# Patient Record
Sex: Male | Born: 1935
Health system: Southern US, Community
[De-identification: ages and names within clinical notes are randomized; demographics above are authoritative.]

## PROBLEM LIST (undated history)

## (undated) DIAGNOSIS — I639 Cerebral infarction, unspecified: Secondary | ICD-10-CM

## (undated) DIAGNOSIS — M1990 Unspecified osteoarthritis, unspecified site: Secondary | ICD-10-CM

## (undated) DIAGNOSIS — I509 Heart failure, unspecified: Secondary | ICD-10-CM

## (undated) DIAGNOSIS — I10 Essential (primary) hypertension: Secondary | ICD-10-CM

## (undated) DIAGNOSIS — R011 Cardiac murmur, unspecified: Secondary | ICD-10-CM

## (undated) DIAGNOSIS — N289 Disorder of kidney and ureter, unspecified: Secondary | ICD-10-CM

## (undated) DIAGNOSIS — R55 Syncope and collapse: Secondary | ICD-10-CM

## (undated) DIAGNOSIS — N529 Male erectile dysfunction, unspecified: Secondary | ICD-10-CM

## (undated) DIAGNOSIS — M545 Low back pain: Secondary | ICD-10-CM

## (undated) DIAGNOSIS — E785 Hyperlipidemia, unspecified: Secondary | ICD-10-CM

## (undated) DIAGNOSIS — N189 Chronic kidney disease, unspecified: Secondary | ICD-10-CM

## (undated) DIAGNOSIS — I499 Cardiac arrhythmia, unspecified: Secondary | ICD-10-CM

## (undated) DIAGNOSIS — K635 Polyp of colon: Secondary | ICD-10-CM

## (undated) DIAGNOSIS — IMO0002 Reserved for concepts with insufficient information to code with codable children: Secondary | ICD-10-CM

## (undated) DIAGNOSIS — N400 Enlarged prostate without lower urinary tract symptoms: Secondary | ICD-10-CM

## (undated) DIAGNOSIS — Q600 Renal agenesis, unilateral: Secondary | ICD-10-CM

## (undated) HISTORY — DX: Reserved for concepts with insufficient information to code with codable children: IMO0002

## (undated) HISTORY — DX: Benign prostatic hyperplasia without lower urinary tract symptoms: N40.0

## (undated) HISTORY — DX: Renal agenesis, unilateral: Q60.0

## (undated) HISTORY — DX: Low back pain: M54.5

## (undated) HISTORY — DX: Unspecified osteoarthritis, unspecified site: M19.90

## (undated) HISTORY — DX: Disorder of kidney and ureter, unspecified: N28.9

## (undated) HISTORY — DX: Polyp of colon: K63.5

## (undated) HISTORY — DX: Essential (primary) hypertension: I10

## (undated) HISTORY — DX: Male erectile dysfunction, unspecified: N52.9

## (undated) HISTORY — DX: Chronic kidney disease, unspecified: N18.9

## (undated) HISTORY — DX: Heart failure, unspecified: I50.9

## (undated) HISTORY — DX: Hyperlipidemia, unspecified: E78.5

---

## 1994-09-23 HISTORY — PX: KNEE ARTHROSCOPY: SHX127

## 1998-10-02 ENCOUNTER — Emergency Department (HOSPITAL_COMMUNITY): Admission: EM | Admit: 1998-10-02 | Discharge: 1998-10-02 | Payer: Self-pay | Admitting: Emergency Medicine

## 1998-10-02 ENCOUNTER — Encounter: Payer: Self-pay | Admitting: Emergency Medicine

## 1998-10-31 ENCOUNTER — Encounter: Payer: Self-pay | Admitting: Urology

## 1998-10-31 ENCOUNTER — Ambulatory Visit (HOSPITAL_COMMUNITY): Admission: RE | Admit: 1998-10-31 | Discharge: 1998-10-31 | Payer: Self-pay | Admitting: Urology

## 1999-06-26 ENCOUNTER — Other Ambulatory Visit: Admission: RE | Admit: 1999-06-26 | Discharge: 1999-06-26 | Payer: Self-pay | Admitting: Urology

## 1999-08-21 ENCOUNTER — Other Ambulatory Visit: Admission: RE | Admit: 1999-08-21 | Discharge: 1999-08-21 | Payer: Self-pay | Admitting: Urology

## 2003-11-11 ENCOUNTER — Emergency Department (HOSPITAL_COMMUNITY): Admission: EM | Admit: 2003-11-11 | Discharge: 2003-11-11 | Payer: Self-pay | Admitting: Family Medicine

## 2005-08-07 ENCOUNTER — Emergency Department (HOSPITAL_COMMUNITY): Admission: EM | Admit: 2005-08-07 | Discharge: 2005-08-07 | Payer: Self-pay | Admitting: Emergency Medicine

## 2005-11-21 LAB — HM COLONOSCOPY

## 2007-08-20 ENCOUNTER — Emergency Department (HOSPITAL_COMMUNITY): Admission: EM | Admit: 2007-08-20 | Discharge: 2007-08-20 | Payer: Self-pay | Admitting: Emergency Medicine

## 2009-10-10 ENCOUNTER — Encounter (INDEPENDENT_AMBULATORY_CARE_PROVIDER_SITE_OTHER): Payer: Self-pay | Admitting: *Deleted

## 2009-10-30 ENCOUNTER — Encounter (INDEPENDENT_AMBULATORY_CARE_PROVIDER_SITE_OTHER): Payer: Self-pay

## 2009-10-31 ENCOUNTER — Ambulatory Visit: Payer: Self-pay | Admitting: Gastroenterology

## 2009-11-08 ENCOUNTER — Ambulatory Visit: Payer: Self-pay | Admitting: Oncology

## 2009-11-14 ENCOUNTER — Ambulatory Visit: Payer: Self-pay | Admitting: Gastroenterology

## 2009-11-15 ENCOUNTER — Encounter: Payer: Self-pay | Admitting: Gastroenterology

## 2009-11-17 LAB — CBC WITH DIFFERENTIAL/PLATELET
BASO%: 0.5 % (ref 0.0–2.0)
EOS%: 4 % (ref 0.0–7.0)
HCT: 39.2 % (ref 38.4–49.9)
HGB: 13.5 g/dL (ref 13.0–17.1)
LYMPH%: 38.9 % (ref 14.0–49.0)
Platelets: 223 10*3/uL (ref 140–400)
RBC: 4.73 10*6/uL (ref 4.20–5.82)
RDW: 13.3 % (ref 11.0–14.6)
WBC: 5.8 10*3/uL (ref 4.0–10.3)
lymph#: 2.2 10*3/uL (ref 0.9–3.3)

## 2009-11-17 LAB — CHCC SMEAR

## 2009-11-21 LAB — COMPREHENSIVE METABOLIC PANEL
ALT: 17 U/L (ref 0–53)
Albumin: 4.5 g/dL (ref 3.5–5.2)
Alkaline Phosphatase: 86 U/L (ref 39–117)
Sodium: 141 mEq/L (ref 135–145)

## 2009-11-21 LAB — IMMUNOFIXATION INTE

## 2009-11-21 LAB — KAPPA/LAMBDA LIGHT CHAINS
Kappa free light chain: 2.11 mg/dL — ABNORMAL HIGH (ref 0.33–1.94)
Lambda Free Lght Chn: 5.61 mg/dL — ABNORMAL HIGH (ref 0.57–2.63)

## 2009-11-21 LAB — IMMUNOFIXATION ELECTROPHORESIS
IgA: 304 mg/dL (ref 68–378)
IgG (Immunoglobin G), Serum: 1450 mg/dL (ref 694–1618)
Total Protein, Serum Electrophoresis: 7.7 g/dL (ref 6.0–8.3)

## 2009-11-21 LAB — LACTATE DEHYDROGENASE: LDH: 166 U/L (ref 94–250)

## 2009-11-21 LAB — SEDIMENTATION RATE: Sed Rate: 22 mm/hr — ABNORMAL HIGH (ref 0–16)

## 2009-11-21 LAB — PROTEIN / CREATININE RATIO, URINE
Creatinine, Urine: 155.3 mg/dL
Total Protein, Urine: 4 mg/dL

## 2010-01-23 ENCOUNTER — Encounter: Admission: RE | Admit: 2010-01-23 | Discharge: 2010-01-23 | Payer: Self-pay | Admitting: Family Medicine

## 2010-03-28 ENCOUNTER — Ambulatory Visit: Payer: Self-pay | Admitting: Oncology

## 2010-04-12 ENCOUNTER — Encounter: Admission: RE | Admit: 2010-04-12 | Discharge: 2010-04-12 | Payer: Self-pay | Admitting: Diagnostic Neuroimaging

## 2010-05-24 ENCOUNTER — Emergency Department (HOSPITAL_COMMUNITY): Admission: EM | Admit: 2010-05-24 | Discharge: 2010-05-24 | Payer: Self-pay | Admitting: Family Medicine

## 2010-08-30 HISTORY — PX: CARDIOVASCULAR STRESS TEST: SHX262

## 2010-10-25 NOTE — Letter (Signed)
Summary: Previsit letter  [facility]  [place], [place] [address]   Phone: [phone]  Fax: [phone]       10/10/2009 MRN: [id number]  John Hines [address] [place], [place]  [address]  Dear [name],  Welcome to the Gastroenterology Division at [facility].    You are scheduled to see a nurse for your pre-procedure visit on 10-31-09 at 1PM on the 3rd floor at [facility], [place] [employer].  We ask that you try to arrive at our office 15 minutes prior to your appointment time to allow for check-in.  Your nurse visit will consist of discussing your medical and surgical history, your immediate family medical history, and your medications.    Please bring a complete list of all your medications or, if you prefer, bring the medication bottles and we will list them.  We will need to be aware of both prescribed and over the counter drugs.  We will need to know exact dosage information as well.  If you are on blood thinners (Coumadin, Plavix, Aggrenox, Ticlid, etc.) please call our office today/prior to your appointment, as we need to consult with your physician about holding your medication.   Please be prepared to read and sign documents such as consent forms, a financial agreement, and acknowledgement forms.  If necessary, and with your consent, a friend or relative is welcome to sit-in on the nurse visit with you.  Please bring your insurance card so that we may make a copy of it.  If your insurance requires a referral to see a specialist, please bring your referral form from your primary care physician.  No co-pay is required for this nurse visit.     If you cannot keep your appointment, please call [phone] to cancel or reschedule prior to your appointment date.  This allows [place] the opportunity to schedule an appointment for another patient in need of care.    Thank you for choosing [facility] for your medical needs.  We  appreciate the opportunity to care for you.  Please visit [place] at our website  to learn more about our practice.                     Sincerely.                                                                                                                   The Gastroenterology Division

## 2010-10-25 NOTE — Letter (Signed)
Summary: Results Letter  [facility]  [place], [place] [address]   Phone: [phone]  Fax: [phone]        November 15, 2009 MRN: [id number]    John Hines [address] [place], [place]  [address]    Dear John Hines,   Good news.  The polyp(s) that were removed during your recent procedure were NOT pre-cancerous.   We normally recommend repeat colonscopy in 10 years however at that point you will be 87 and colon cancer screening is usually not important after the age of 17.   Please call if you have any questions or concerns.       Sincerely,  Milus Banister MD  This letter has been electronically signed by your physician.  Appended Document: Results Letter  Letter mailed 2.24.11

## 2010-10-25 NOTE — Procedures (Signed)
Summary: Colonoscopy  Patient: John Hines Note: All result statuses are Final unless otherwise noted.  Tests: (1) Colonoscopy (COL)   COL Colonoscopy           [facility]     [place], [place]  [address]           COLONOSCOPY PROCEDURE REPORT           PATIENT:  [name], [name]  MR#:  [id number]     BIRTHDATE:  Jun 20, 1932, 69 yrs. old  GENDER:  male     ENDOSCOPIST:  Milus Banister, MD     Referred by:  Jenna Luo, M.D.     Redge Gainer, M.D.     PROCEDURE DATE:  11/14/2009     PROCEDURE:  Colonoscopy with biopsy     ASA CLASS:  Class II     INDICATIONS:  Routine Risk Screening.had colonoscopy 15-20 years     ago, polyp removed, was told he needed another colonoscopy in     "10-15" years     MEDICATIONS:   Fentanyl 50 mcg IV, Versed 7 mg IV     DESCRIPTION OF PROCEDURE:   After the risks benefits and     alternatives of the procedure were thoroughly explained, informed     consent was obtained.  Digital rectal exam was performed and     revealed no rectal masses.   The LB CF-[id number] endoscope     was introduced through the anus and advanced to the cecum, which     was identified by both the appendix and ileocecal valve, without     limitations.  The quality of the prep was excellent, using     MoviPrep.  The instrument was then slowly withdrawn as the colon     was fully examined.     <<PROCEDUREIMAGES>>     FINDINGS:  A sessile polyp was found in the sigmoid colon. This     was 1-72mm across, removed with forceps and sent to pathology (see     image3 and image5).  This was otherwise a normal examination of     the colon (see image6, image1, and image2).   Retroflexed views in     the rectum revealed no abnormalities.    The scope was then     withdrawn from the patient and the procedure completed.     COMPLICATIONS:  None           ENDOSCOPIC IMPRESSION:     1) Small sessile polyp in the sigmoid colon; removed and sent  to     pathology     2) Otherwise normal examination           RECOMMENDATIONS:     1) If the polyp(s) removed today are proven to be adenomatous     (pre-cancerous) polyps, you will need a repeat colonoscopy in 5     years. Otherwise you should continue to follow colorectal cancer     screening guidelines for "routine risk" patients with colonoscopy     in 10 years.  You will be 87 at that point and colorectal cancer     screening/polyp surveillance may not be a clinically relevant     issue for you.     2) You will receive a letter within 1-2 weeks with the results     of your biopsy as well as final recommendations. Please call my  office if you have not received a letter after 3 weeks.           ______________________________     Milus Banister, MD           n.     eSIGNED:   Milus Banister at 11/14/2009 09:55 AM           Myrtie Hawk, [id number]  Note: An exclamation mark (!) indicates a result that was not dispersed into the flowsheet. Document Creation Date: 11/14/2009 9:55 AM _______________________________________________________________________  (1) Order result status: Final Collection or observation date-time: 11/14/2009 09:49 Requested date-time:  Receipt date-time:  Reported date-time:  Referring Physician:   Ordering Physician: Owens Loffler [phone]) Specimen Source:  Source: Tawanna Cooler Order Number: [id number] Lab site:

## 2010-10-25 NOTE — Letter (Signed)
Summary: [facility] Instructions  [facility]  [place], [place] [address]   Phone: [phone]  Fax: [phone]       ADRI LEGNER    75/05/1932    MRN: [id number]        Procedure Day /Date:  Tuesday 11/14/2009     Arrival Time: 9:00 am      Procedure Time: 10:00 am     Location of Procedure:                    _ x_  [facility] (4th Floor)                        [facility]   Starting 5 days prior to your procedure Thursday 2/17 do not eat nuts, seeds, popcorn, corn, beans, peas,  salads, or any raw vegetables.  Do not take any fiber supplements (e.g. Metamucil, Citrucel, and Benefiber).  THE DAY BEFORE YOUR PROCEDURE         DATE: Monday 2/21  1.  Drink clear liquids the entire day-NO SOLID FOOD  2.  Do not drink anything colored red or purple.  Avoid juices with pulp.  No orange juice.  3.  Drink at least 64 oz. (8 glasses) of fluid/clear liquids during the day to prevent dehydration and help the prep work efficiently.  CLEAR LIQUIDS INCLUDE: Water Jello Ice Popsicles Tea (sugar ok, no milk/cream) Powdered fruit flavored drinks Coffee (sugar ok, no milk/cream) Gatorade Juice: apple, white grape, white cranberry  Lemonade Clear bullion, consomm, broth Carbonated beverages (any kind) Strained chicken noodle soup Hard Candy                             4.  In the morning, mix first dose of MoviPrep solution:    Empty 1 Pouch A and 1 Pouch B into the disposable container    Add lukewarm drinking water to the top line of the container. Mix to dissolve    Refrigerate (mixed solution should be used within 24 hrs)  5.  Begin drinking the prep at 5:00 p.m. The MoviPrep container is divided by 4 marks.   Every 15 minutes drink the solution down to the next mark (approximately 8 oz) until the full liter is complete.   6.  Follow completed prep with 16 oz of clear liquid of your choice  (Nothing red or purple).  Continue to drink clear liquids until bedtime.  7.  Before going to bed, mix second dose of MoviPrep solution:    Empty 1 Pouch A and 1 Pouch B into the disposable container    Add lukewarm drinking water to the top line of the container. Mix to dissolve    Refrigerate  THE DAY OF YOUR PROCEDURE      DATE: Tuesday 2/22  Beginning at 5:00 a.m. (5 hours before procedure):         1. Every 15 minutes, drink the solution down to the next mark (approx 8 oz) until the full liter is complete.  2. Follow completed prep with 16 oz. of clear liquid of your choice.    3. You may drink clear liquids until 8:00  (2 HOURS BEFORE PROCEDURE).   MEDICATION INSTRUCTIONS  Unless otherwise instructed, you should take regular prescription medications with a small sip of water   as early as possible the morning of  your procedure..  Additional medication instructions: Do not take fluid pill am of procedure         OTHER INSTRUCTIONS  You will need a responsible adult at least 75 years of age to accompany you and drive you home.   This person must remain in the waiting room during your procedure.  Wear loose fitting clothing that is easily removed.  Leave jewelry and other valuables at home.  However, you may wish to bring a book to read or  an iPod/MP3 player to listen to music as you wait for your procedure to start.  Remove all body piercing jewelry and leave at home.  Total time from sign-in until discharge is approximately 2-3 hours.  You should go home directly after your procedure and rest.  You can resume normal activities the  day after your procedure.  The day of your procedure you should not:   Drive   Make legal decisions   Operate machinery   Drink alcohol   Return to work  You will receive specific instructions about eating, activities and medications before you leave.    The above instructions have been reviewed and explained to me by    Cornelia Copa RN  October 31, 2009 12:59 PM     I fully understand and can verbalize these instructions _____________________________ Date _________

## 2010-10-25 NOTE — Miscellaneous (Signed)
Summary: Lec previsit  Clinical Lists Changes  Medications: Added new medication of MOVIPREP 100 GM  SOLR (PEG-KCL-NACL-NASULF-NA ASC-C) As per prep instructions. - Signed Rx of MOVIPREP 100 GM  SOLR (PEG-KCL-NACL-NASULF-NA ASC-C) As per prep instructions.;  #1 x 0;  Signed;  Entered by: Cornelia Copa RN;  Authorized by: Milus Banister MD;  Method used: Electronically to [facility]  [address] [place], [place]  [address], Ph: [phone] or [phone], Fax: [phone] Allergies: Added new allergy or adverse reaction of PENICILLIN Observations: Added new observation of NKA: F (10/31/2009 12:38)    Prescriptions: MOVIPREP 100 GM  SOLR (PEG-KCL-NACL-NASULF-NA ASC-C) As per prep instructions.  #1 x 0   Entered by:   Cornelia Copa RN   Authorized by:   Milus Banister MD   Signed by:   Cornelia Copa RN on 10/31/2009   Method used:   [facility]        [facility]  [address] (retail)       [address]       [place], [place]  [address]       Ph: [phone] or [phone]       Fax: [phone]   RxID:   [id number]

## 2011-04-22 ENCOUNTER — Encounter: Payer: Self-pay | Admitting: Family Medicine

## 2011-04-22 DIAGNOSIS — I10 Essential (primary) hypertension: Secondary | ICD-10-CM | POA: Insufficient documentation

## 2011-04-22 DIAGNOSIS — E785 Hyperlipidemia, unspecified: Secondary | ICD-10-CM | POA: Insufficient documentation

## 2011-08-19 ENCOUNTER — Other Ambulatory Visit: Payer: Self-pay

## 2011-08-19 ENCOUNTER — Emergency Department (HOSPITAL_COMMUNITY): Payer: PRIVATE HEALTH INSURANCE

## 2011-08-19 ENCOUNTER — Observation Stay (HOSPITAL_COMMUNITY)
Admission: EM | Admit: 2011-08-19 | Discharge: 2011-08-21 | Disposition: A | Discharge status: Home / Self Care | Payer: PRIVATE HEALTH INSURANCE | Source: Ambulatory Visit | Attending: Family Medicine | Admitting: Family Medicine

## 2011-08-19 ENCOUNTER — Encounter (HOSPITAL_COMMUNITY): Payer: Self-pay | Admitting: General Practice

## 2011-08-19 DIAGNOSIS — G319 Degenerative disease of nervous system, unspecified: Secondary | ICD-10-CM | POA: Insufficient documentation

## 2011-08-19 DIAGNOSIS — E785 Hyperlipidemia, unspecified: Secondary | ICD-10-CM | POA: Insufficient documentation

## 2011-08-19 DIAGNOSIS — I517 Cardiomegaly: Secondary | ICD-10-CM | POA: Insufficient documentation

## 2011-08-19 DIAGNOSIS — R7309 Other abnormal glucose: Secondary | ICD-10-CM | POA: Insufficient documentation

## 2011-08-19 DIAGNOSIS — R0989 Other specified symptoms and signs involving the circulatory and respiratory systems: Secondary | ICD-10-CM | POA: Insufficient documentation

## 2011-08-19 DIAGNOSIS — I129 Hypertensive chronic kidney disease with stage 1 through stage 4 chronic kidney disease, or unspecified chronic kidney disease: Secondary | ICD-10-CM | POA: Insufficient documentation

## 2011-08-19 DIAGNOSIS — R110 Nausea: Secondary | ICD-10-CM | POA: Insufficient documentation

## 2011-08-19 DIAGNOSIS — I447 Left bundle-branch block, unspecified: Secondary | ICD-10-CM

## 2011-08-19 DIAGNOSIS — I10 Essential (primary) hypertension: Secondary | ICD-10-CM

## 2011-08-19 DIAGNOSIS — D649 Anemia, unspecified: Secondary | ICD-10-CM | POA: Insufficient documentation

## 2011-08-19 DIAGNOSIS — N183 Chronic kidney disease, stage 3 unspecified: Secondary | ICD-10-CM

## 2011-08-19 DIAGNOSIS — R61 Generalized hyperhidrosis: Secondary | ICD-10-CM | POA: Insufficient documentation

## 2011-08-19 DIAGNOSIS — I519 Heart disease, unspecified: Secondary | ICD-10-CM | POA: Insufficient documentation

## 2011-08-19 DIAGNOSIS — R739 Hyperglycemia, unspecified: Secondary | ICD-10-CM

## 2011-08-19 DIAGNOSIS — I499 Cardiac arrhythmia, unspecified: Secondary | ICD-10-CM

## 2011-08-19 DIAGNOSIS — M109 Gout, unspecified: Secondary | ICD-10-CM | POA: Insufficient documentation

## 2011-08-19 DIAGNOSIS — R0609 Other forms of dyspnea: Secondary | ICD-10-CM | POA: Insufficient documentation

## 2011-08-19 DIAGNOSIS — R55 Syncope and collapse: Principal | ICD-10-CM

## 2011-08-19 DIAGNOSIS — M7989 Other specified soft tissue disorders: Secondary | ICD-10-CM | POA: Insufficient documentation

## 2011-08-19 DIAGNOSIS — Z8673 Personal history of transient ischemic attack (TIA), and cerebral infarction without residual deficits: Secondary | ICD-10-CM | POA: Insufficient documentation

## 2011-08-19 HISTORY — DX: Cerebral infarction, unspecified: I63.9

## 2011-08-19 HISTORY — DX: Cardiac arrhythmia, unspecified: I49.9

## 2011-08-19 HISTORY — DX: Cardiac murmur, unspecified: R01.1

## 2011-08-19 HISTORY — DX: Syncope and collapse: R55

## 2011-08-19 LAB — CBC
Hemoglobin: 12.5 g/dL — ABNORMAL LOW (ref 13.0–17.0)
MCH: 27.6 pg (ref 26.0–34.0)
MCV: 83 fL (ref 78.0–100.0)
Platelets: 192 10*3/uL (ref 150–400)
RBC: 4.53 MIL/uL (ref 4.22–5.81)

## 2011-08-19 LAB — BASIC METABOLIC PANEL
BUN: 28 mg/dL — ABNORMAL HIGH (ref 6–23)
Calcium: 9.2 mg/dL (ref 8.4–10.5)
GFR calc non Af Amer: 27 mL/min — ABNORMAL LOW (ref >90)

## 2011-08-19 LAB — DIFFERENTIAL
Eosinophils Absolute: 0.1 10*3/uL (ref 0.0–0.7)
Lymphocytes Relative: 24 % (ref 12–46)
Monocytes Absolute: 0.7 10*3/uL (ref 0.1–1.0)
Monocytes Relative: 11 % (ref 3–12)
Neutro Abs: 4.5 10*3/uL (ref 1.7–7.7)

## 2011-08-19 LAB — HEMOGLOBIN A1C: Mean Plasma Glucose: 157 mg/dL — ABNORMAL HIGH (ref <117)

## 2011-08-19 LAB — GLUCOSE, CAPILLARY
Glucose-Capillary: 152 mg/dL — ABNORMAL HIGH (ref 70–99)
Glucose-Capillary: 136 mg/dL — ABNORMAL HIGH (ref 70–99)

## 2011-08-19 LAB — TSH: TSH: 1.444 u[IU]/mL (ref 0.350–4.500)

## 2011-08-19 LAB — PRO B NATRIURETIC PEPTIDE: Pro B Natriuretic peptide (BNP): 145.8 pg/mL (ref 0–450)

## 2011-08-19 MED ORDER — SENNA 8.6 MG PO TABS
2.0000 | ORAL_TABLET | Freq: Every day | ORAL | Status: DC | PRN
  Filled 2011-08-19: qty 2

## 2011-08-19 MED ORDER — SODIUM CHLORIDE 0.9 % IJ SOLN
3.0000 mL | Freq: Two times a day (BID) | INTRAMUSCULAR | Status: DC
  Administered 2011-08-19 – 2011-08-21 (×3): 3 mL via INTRAVENOUS

## 2011-08-19 MED ORDER — SODIUM CHLORIDE 0.9 % IJ SOLN
3.0000 mL | INTRAMUSCULAR | Status: DC | PRN
  Administered 2011-08-21: 3 mL via INTRAVENOUS

## 2011-08-19 MED ORDER — AMLODIPINE-OLMESARTAN 5-40 MG PO TABS: 1.0000 | ORAL_TABLET | Freq: Every day | ORAL | Status: DC

## 2011-08-19 MED ORDER — AMLODIPINE BESYLATE 5 MG PO TABS
5.0000 mg | ORAL_TABLET | Freq: Every day | ORAL | Status: DC
  Administered 2011-08-20 – 2011-08-21 (×2): 5 mg via ORAL
  Filled 2011-08-19 (×2): qty 1

## 2011-08-19 MED ORDER — INSULIN ASPART 100 UNIT/ML ~~LOC~~ SOLN: 0.0000 [IU] | Freq: Every day | SUBCUTANEOUS | Status: DC

## 2011-08-19 MED ORDER — ONDANSETRON HCL 4 MG/2ML IJ SOLN: 4.0000 mg | Freq: Four times a day (QID) | INTRAMUSCULAR | Status: DC | PRN

## 2011-08-19 MED ORDER — ZOLPIDEM TARTRATE 5 MG PO TABS: 5.0000 mg | ORAL_TABLET | Freq: Every evening | ORAL | Status: DC | PRN

## 2011-08-19 MED ORDER — CARVEDILOL 12.5 MG PO TABS
12.5000 mg | ORAL_TABLET | Freq: Two times a day (BID) | ORAL | Status: DC
  Administered 2011-08-20 – 2011-08-21 (×3): 12.5 mg via ORAL
  Filled 2011-08-19 (×5): qty 1

## 2011-08-19 MED ORDER — ACETAMINOPHEN 650 MG RE SUPP: 650.0000 mg | Freq: Four times a day (QID) | RECTAL | Status: DC | PRN

## 2011-08-19 MED ORDER — FEBUXOSTAT 40 MG PO TABS
40.0000 mg | ORAL_TABLET | Freq: Every day | ORAL | Status: DC
  Administered 2011-08-20 – 2011-08-21 (×3): 40 mg via ORAL
  Filled 2011-08-19 (×2): qty 1

## 2011-08-19 MED ORDER — INSULIN ASPART 100 UNIT/ML ~~LOC~~ SOLN
0.0000 [IU] | Freq: Three times a day (TID) | SUBCUTANEOUS | Status: DC
  Administered 2011-08-19: 3 [IU] via SUBCUTANEOUS
  Administered 2011-08-20 – 2011-08-21 (×2): 2 [IU] via SUBCUTANEOUS
  Filled 2011-08-19: qty 3

## 2011-08-19 MED ORDER — SIMVASTATIN 20 MG PO TABS
20.0000 mg | ORAL_TABLET | Freq: Every day | ORAL | Status: DC
  Administered 2011-08-20 – 2011-08-21 (×2): 20 mg via ORAL
  Filled 2011-08-19 (×2): qty 1

## 2011-08-19 MED ORDER — PANTOPRAZOLE SODIUM 40 MG PO TBEC
40.0000 mg | DELAYED_RELEASE_TABLET | Freq: Every day | ORAL | Status: DC
  Administered 2011-08-19 – 2011-08-20 (×2): 40 mg via ORAL
  Filled 2011-08-19 (×2): qty 1

## 2011-08-19 MED ORDER — ASPIRIN EC 81 MG PO TBEC
81.0000 mg | DELAYED_RELEASE_TABLET | Freq: Every day | ORAL | Status: DC
  Administered 2011-08-20 – 2011-08-21 (×2): 81 mg via ORAL
  Filled 2011-08-19 (×2): qty 1

## 2011-08-19 MED ORDER — INSULIN ASPART 100 UNIT/ML ~~LOC~~ SOLN
4.0000 [IU] | Freq: Three times a day (TID) | SUBCUTANEOUS | Status: DC
  Administered 2011-08-19 – 2011-08-21 (×4): 4 [IU] via SUBCUTANEOUS

## 2011-08-19 MED ORDER — ONDANSETRON HCL 4 MG PO TABS: 4.0000 mg | ORAL_TABLET | Freq: Four times a day (QID) | ORAL | Status: DC | PRN

## 2011-08-19 MED ORDER — OXYCODONE-ACETAMINOPHEN 5-325 MG PO TABS
1.0000 | ORAL_TABLET | Freq: Once | ORAL | Status: AC
  Administered 2011-08-19: 1 via ORAL
  Filled 2011-08-19: qty 1

## 2011-08-19 MED ORDER — ACETAMINOPHEN 325 MG PO TABS
650.0000 mg | ORAL_TABLET | Freq: Four times a day (QID) | ORAL | Status: DC | PRN
  Filled 2011-08-19: qty 2

## 2011-08-19 MED ORDER — ALUM & MAG HYDROXIDE-SIMETH 200-200-20 MG/5ML PO SUSP: 30.0000 mL | Freq: Four times a day (QID) | ORAL | Status: DC | PRN

## 2011-08-19 MED ORDER — OLMESARTAN MEDOXOMIL 40 MG PO TABS
40.0000 mg | ORAL_TABLET | Freq: Every day | ORAL | Status: DC
  Administered 2011-08-20 – 2011-08-21 (×2): 40 mg via ORAL
  Filled 2011-08-19 (×2): qty 1

## 2011-08-19 NOTE — ED Notes (Signed)
To ed via ems for eval after having a syncopal episode with standing in kitchen. Witnessed by son. Found to have sbp of 80 by ems. Only hx is gout.

## 2011-08-19 NOTE — Progress Notes (Signed)
08/19/11  1630 Pt. Admitted to 5504-2 via stretcher by er staff and family with pt. Pt. wants private room-in semi private at present; on list for private. Had incont. Stool from stretcher to bed.  Alert and oriented x3-neuro status good- no numbness/tingling. Pt. home with wife and plan d/c back there.  Delories Heinz Anitta Tenny,RN

## 2011-08-19 NOTE — ED Notes (Signed)
Patient transported to CT 

## 2011-08-19 NOTE — ED Notes (Signed)
Patient denies pain and is resting comfortably.  

## 2011-08-19 NOTE — ED Notes (Signed)
Pt comfortable. Denies pain or discomfort. Pt visiting with family.

## 2011-08-19 NOTE — ED Notes (Signed)
Family at bedside. 

## 2011-08-19 NOTE — ED Notes (Signed)
Returned from radiology. 

## 2011-08-19 NOTE — ED Notes (Signed)
Pt states he took his meds this am and then went out to 'fix a machine' with his son. When returning home he felt nauseated. States he began to feel hot and sweaty when walking up his steps and once in kitchen he passed out. Pt states he was walking with crutches due to his gout pain. Denies any cp or sob. Skin dry now. Peripheral pulses all palpable. Denies abd pain

## 2011-08-19 NOTE — ED Notes (Signed)
Vital signs stable. 

## 2011-08-19 NOTE — ED Provider Notes (Addendum)
History     CSN: [id number] Arrival date & time: 08/19/2011  9:36 AM   First MD Initiated Contact with Patient 08/19/11 [phone]      Chief Complaint  Patient presents with  . Near Syncope    (Consider location/radiation/quality/duration/timing/severity/associated sxs/prior treatment) Patient is a 77 y.o. male presenting with syncope. The history is provided by the patient.  Loss of Consciousness This is a new problem. The current episode started today. Associated symptoms include diaphoresis, joint swelling, nausea and vertigo. Pertinent negatives include no abdominal pain, anorexia, chest pain, chills, congestion, coughing, fatigue, fever, headaches, myalgias, neck pain, numbness, rash, sore throat, swollen glands, urinary symptoms, visual change, vomiting or weakness. The symptoms are aggravated by walking. The treatment provided moderate relief.   Today after a 25 seconds episode of syncope witnessed by his son. States that he became dizzy prior to passing out and was nauseated but did not vomit. States that this happened to him 10 years ago but he did not go to the doctor. He denies shortness of breath although his daughter says he was gasping for air when he passed out. Denies shaking all over patient does have one kidney functioning. He did history of hypertension hyperlipidemia and he has gout a gout flareup to his left ankle. Family also states that he has a remote history of a light stroke.  Denies headache, shortness of breath or chest pain with this episode. Past Medical History  Diagnosis Date  . ED (erectile dysfunction)   . BPH (benign prostatic hyperplasia)   . Hyperlipidemia   . Hypertension   . Dyslipidemia   . Arthritis     No past surgical history on file.  No family history on file.  History  Substance Use Topics  . Smoking status: Not on file  . Smokeless tobacco: Not on file  . Alcohol Use: Not on file      Review of Systems  Constitutional: Positive for  diaphoresis. Negative for fever, chills and fatigue.  HENT: Negative for congestion, sore throat and neck pain.   Respiratory: Negative for cough.   Cardiovascular: Positive for syncope. Negative for chest pain.  Gastrointestinal: Positive for nausea. Negative for vomiting, abdominal pain and anorexia.  Musculoskeletal: Positive for joint swelling. Negative for myalgias.  Skin: Negative for rash.  Neurological: Positive for vertigo. Negative for weakness, numbness and headaches.  All other systems reviewed and are negative.    Allergies  Penicillins  Home Medications   Current Outpatient Rx  Name Route Sig Dispense Refill  . AMLODIPINE-OLMESARTAN 5-40 MG PO TABS Oral Take 1 tablet by mouth daily.     . ASPIRIN EC 81 MG PO TBEC Oral Take 81 mg by mouth daily.      . ATORVASTATIN CALCIUM 40 MG PO TABS Oral Take 40 mg by mouth at bedtime.      Marland Kitchen CARVEDILOL 12.5 MG PO TABS Oral Take 12.5 mg by mouth 2 (two) times daily with a meal.      . HYDROCHLOROTHIAZIDE 25 MG PO TABS Oral Take 25 mg by mouth daily.       BP 102/66  Pulse 70  Temp(Src) 97.1 F (36.2 C) (Oral)  Resp 15  SpO2 99%  Physical Exam  Constitutional: He is oriented to person, place, and time. He appears well-developed and well-nourished.  Eyes: Pupils are equal, round, and reactive to light.  Neck: Normal range of motion.  Cardiovascular: Normal rate.  Exam reveals no gallop and no friction rub.  No murmur heard. Pulmonary/Chest: Effort normal and breath sounds normal. No respiratory distress. He has no wheezes. He has no rales.  Abdominal: Soft. Bowel sounds are normal.  Musculoskeletal: He exhibits edema.  Neurological: He is alert and oriented to person, place, and time. He has normal reflexes.  Skin: Skin is warm and dry. No rash noted. No erythema.  Psychiatric: He has a normal mood and affect.    ED Course  Procedures (including critical care time)  Labs Reviewed  CBC - Abnormal; Notable for the  following:    Hemoglobin 12.5 (*)    HCT 37.6 (*)    All other components within normal limits  BASIC METABOLIC PANEL - Abnormal; Notable for the following:    Glucose, Bld 171 (*)    BUN 28 (*)    Creatinine, Ser 2.20 (*)    GFR calc non Af Amer 27 (*)    GFR calc Af Amer 31 (*)    All other components within normal limits  DIFFERENTIAL  POCT I-STAT TROPONIN I  I-STAT TROPONIN I   Dg Chest 2 View  08/19/2011  *RADIOLOGY REPORT*  Clinical Data: Syncope.  Hypertension.  CHEST - 2 VIEW  Comparison: 05/24/2010.  Findings: Mildly enlarged cardiac silhouette with a mild increase in size.  Clear lungs with normal vascularity.  Mild thoracic spine degenerative changes.  IMPRESSION: Mild cardiomegaly with mild progression.  Original Report Authenticated By: Gerald Stabs, M.D.   Ct Head Wo Contrast  08/19/2011  *RADIOLOGY REPORT*  Clinical Data: Syncope  CT HEAD WITHOUT CONTRAST  Technique:  Contiguous axial images were obtained from the base of the skull through the vertex without contrast.  Comparison: 01/23/2010, 04/12/2010  Findings: Diffuse brain atrophy noted with prominence of the anterior frontal CSF spaces.  Remote right cerebellar infarct.  No acute intracranial hemorrhage, definite acute infarction, midline shift, herniation, hydrocephalus, or extra-axial hemorrhage.  Gray- white matter differentiation maintained.  Cisterns patent.  No orbital abnormality.  Mastoids and sinuses clear.  IMPRESSION: Stable atrophy.  No acute intracranial finding.  Remote right cerebellar infarct.  Original Report Authenticated By: Jerilynn Mages. Daryll Brod, M.D.     1. Syncope   2. Hyperlipidemia   3. Hypertension       MDM  History Simmonds will be coming in today for syncopal episode per hospitalists Team 5. He shows left bundle branch on 5. Troponin negative today.  EKG shows L BBB.  No old ekg. Witnessed syncopal episode lasting 20 seconds.  Son was with him.    Labs Reviewed  CBC - Abnormal; Notable  for the following:    Hemoglobin 12.5 (*)    HCT 37.6 (*)    All other components within normal limits  BASIC METABOLIC PANEL - Abnormal; Notable for the following:    Glucose, Bld 171 (*)    BUN 28 (*)    Creatinine, Ser 2.20 (*)    GFR calc non Af Amer 27 (*)    GFR calc Af Amer 31 (*)    All other components within normal limits  DIFFERENTIAL  POCT I-STAT TROPONIN I  I-STAT TROPONIN I     Date: 08/19/2011  Rate: 64  Rhythm: normal sinus rhythm  QRS Axis: normal  Intervals: normal  ST/T Wave abnormalities: normal  Conduction Disutrbances:left bundle branch block  Narrative Interpretation: NST  LBBB  Old EKG Reviewed: none     Sheryle Hail, NP 08/19/11 1223  Sheryle Hail, NP 08/19/11 1626

## 2011-08-19 NOTE — ED Notes (Signed)
Patient transported to X-ray 

## 2011-08-19 NOTE — ED Provider Notes (Signed)
Medical screening examination/treatment/procedure(s) were conducted as a shared visit with non-physician practitioner(s) and myself.  I personally evaluated the patient during the encounter Pt with syncope today without injury or other associated sx.  Currently no symptoms now but having gout pain.  No hx of syncope in the past and LBBB on EKG without old to compare.  No pacemaker but hx of irregular HR.  Blanchie Dessert, MD 08/19/11 1253

## 2011-08-19 NOTE — H&P (Addendum)
PCP:   Clois Dupes, MD, MD   Chief Complaint:  Renella Cunas out  HPI: 77 year old man with a past medical history significant for hypertension and hyperlipidemia presenting with an episode of syncope witnessed by his son this morning. The patient was in his usual state of health until this morning when he became lightheaded and nauseous before passing out. He denied head trauma, urinary or fecal incontinence, or syncopal headache or drowsiness. There was no seizure activity as witnessed by his son. Patient denied chest pain, shortness of breath or palpitations. Patient denied focal weakness, numbness, or paresthesias. In the emergency department the patient was asymptomatic and his only complaint was a flare of gout 2 weeks ago. Patient does have a remote history of a light stroke and a syncopal episode about 10 years ago. No medical care was sought at that time.  Allergies:   Allergies  Allergen Reactions  . Penicillins Swelling      Past Medical History  Diagnosis Date  . ED (erectile dysfunction)   . BPH (benign prostatic hyperplasia)   . Hyperlipidemia   . Hypertension   . Dyslipidemia   . Arthritis   Last colonoscopy February 2011 benign  No past surgical history on file.  Prior to Admission medications   Medication Sig Start Date End Date Taking? Authorizing Provider  amLODipine-olmesartan (AZOR) 5-40 MG per tablet Take 1 tablet by mouth daily.    Yes Historical Provider, MD  aspirin EC 81 MG tablet Take 81 mg by mouth daily.     Yes Historical Provider, MD  atorvastatin (LIPITOR) 40 MG tablet Take 40 mg by mouth at bedtime.     Yes Historical Provider, MD  carvedilol (COREG) 12.5 MG tablet Take 12.5 mg by mouth 2 (two) times daily with a meal.     Yes Historical Provider, MD  hydrochlorothiazide 25 MG tablet Take 25 mg by mouth daily.    Yes Historical Provider, MD    Social History:  does not have a smoking history on file. He does not have any smokeless tobacco  history on file. Patient admits to social drinking with last drink on Thanksgiving day. Denies binge drinking  Not pertinent family history  Review of Systems:  Constitutional: Denies fever, chills, diaphoresis, appetite change and fatigue.  HEENT: Denies photophobia, eye pain, redness, hearing loss, ear pain, congestion, sore throat, rhinorrhea, sneezing, mouth sores, trouble swallowing, neck pain, neck stiffness and tinnitus.   Respiratory: Denies SOB, DOE, cough, chest tightness,  and wheezing.   Cardiovascular: Denies chest pain, palpitations and leg swelling.  Gastrointestinal: Denies nausea, vomiting, abdominal pain, diarrhea, constipation, blood in stool and abdominal distention.  Genitourinary: Denies dysuria, urgency, frequency, hematuria, flank pain and difficulty urinating.  Musculoskeletal: Complains of mild pain in the left ankle  Skin: Denies pallor, rash and wound.  Neurological: As in history of present illness   Physical Exam: Blood pressure 80/46, pulse 66, temperature 97.7 F (36.5 C), temperature source Oral, resp. rate 15, SpO2 97.00%. Physical Exam  Constitutional: He is oriented to person, place, and time and well-developed, well-nourished, and in no distress.  HENT:  Head: Normocephalic and atraumatic.  Eyes: Conjunctivae and EOM are normal. Pupils are equal, round, and reactive to light.  Neck: Normal range of motion. Neck supple. No JVD present. No thyromegaly present.  Cardiovascular: Normal rate, regular rhythm, normal heart sounds and intact distal pulses.  Exam reveals no gallop and no friction rub.   No murmur heard. Pulmonary/Chest: Effort normal and breath sounds  normal. He has no rales.  Abdominal: Soft. Bowel sounds are normal. He exhibits no distension and no mass. There is no tenderness. There is no rebound and no guarding.  Musculoskeletal: He exhibits edema (left ankle without erythema).  Lymphadenopathy:    He has no cervical adenopathy.    Neurological: He is alert and oriented to person, place, and time. He has normal reflexes. He displays normal reflexes. No cranial nerve deficit. He exhibits normal muscle tone. Gait normal. Coordination normal. GCS score is 15.  Skin: Skin is warm and dry.    Labs on Admission:  Results for orders placed during the hospital encounter of 08/19/11 (from the past 48 hour(s))  CBC     Status: Abnormal   Collection Time   08/19/11 10:10 AM      Component Value Range Comment   WBC 7.0  4.0 - 10.5 (K/uL)    RBC 4.53  4.22 - 5.81 (MIL/uL)    Hemoglobin 12.5 (*) 13.0 - 17.0 (g/dL)    HCT 37.6 (*) 39.0 - 52.0 (%)    MCV 83.0  78.0 - 100.0 (fL)    MCH 27.6  26.0 - 34.0 (pg)    MCHC 33.2  30.0 - 36.0 (g/dL)    RDW 13.6  11.5 - 15.5 (%)    Platelets 192  150 - 400 (K/uL)   DIFFERENTIAL     Status: Normal   Collection Time   08/19/11 10:10 AM      Component Value Range Comment   Neutrophils Relative 64  43 - 77 (%)    Neutro Abs 4.5  1.7 - 7.7 (K/uL)    Lymphocytes Relative 24  12 - 46 (%)    Lymphs Abs 1.7  0.7 - 4.0 (K/uL)    Monocytes Relative 11  3 - 12 (%)    Monocytes Absolute 0.7  0.1 - 1.0 (K/uL)    Eosinophils Relative 1  0 - 5 (%)    Eosinophils Absolute 0.1  0.0 - 0.7 (K/uL)    Basophils Relative 0  0 - 1 (%)    Basophils Absolute 0.0  0.0 - 0.1 (K/uL)   BASIC METABOLIC PANEL     Status: Abnormal   Collection Time   08/19/11 10:10 AM      Component Value Range Comment   Sodium 138  135 - 145 (mEq/L)    Potassium 4.3  3.5 - 5.1 (mEq/L)    Chloride 102  96 - 112 (mEq/L)    CO2 28  19 - 32 (mEq/L)    Glucose, Bld 171 (*) 70 - 99 (mg/dL)    BUN 28 (*) 6 - 23 (mg/dL)    Creatinine, Ser 2.20 (*) 0.50 - 1.35 (mg/dL)    Calcium 9.2  8.4 - 10.5 (mg/dL)    GFR calc non Af Amer 27 (*) >90 (mL/min)    GFR calc Af Amer 31 (*) >90 (mL/min)   POCT I-STAT TROPONIN I     Status: Normal   Collection Time   08/19/11 10:30 AM      Component Value Range Comment   Troponin i, poc 0.00   0.00 - 0.08 (ng/mL)    Comment 3              Radiological Exams on Admission: CT HEAD WITHOUT CONTRAST  Technique: Contiguous axial images were obtained from the base of  the skull through the vertex without contrast.  Comparison: 01/23/2010, 04/12/2010  Findings: Diffuse brain atrophy noted with prominence  of the  anterior frontal CSF spaces. Remote right cerebellar infarct. No  acute intracranial hemorrhage, definite acute infarction, midline  shift, herniation, hydrocephalus, or extra-axial hemorrhage. Gray-  white matter differentiation maintained. Cisterns patent. No  orbital abnormality. Mastoids and sinuses clear.  IMPRESSION:  Stable atrophy. No acute intracranial finding.  Remote right cerebellar infarct.  Original Report Authenticated By: Jerilynn Mages. Daryll Brod, M.D.  Assessment/Plan Active Problems: Syncope: Admit patient to observation telemetry unit. Rule out MI. Obtain MRI of the brain with and without contrast. Continue aspirin 81 mg a day. Left bundle-branch block. No old EKG for comparison Renal insufficiency. Repeat BMP in a.m. Hyperglycemia: Obtain hemoglobin A1c. Insulin sliding scale per protocol Hypertension continue Azor/amlodipine and carvedilol. Discontinue hydrochlorothiazide because of gout. Anemia: Repeat H&H in the morning. Gout: Continue Uloric k 40 mg a day  Time Spent on Admission: 45 minutes  Ardyth Gal 08/19/2011, 4:39 PM

## 2011-08-19 NOTE — ED Notes (Signed)
cbg was 92

## 2011-08-20 DIAGNOSIS — R55 Syncope and collapse: Secondary | ICD-10-CM

## 2011-08-20 HISTORY — PX: TRANSTHORACIC ECHOCARDIOGRAM: SHX275

## 2011-08-20 LAB — BASIC METABOLIC PANEL
BUN: 42 mg/dL — ABNORMAL HIGH (ref 6–23)
Creatinine, Ser: 2.23 mg/dL — ABNORMAL HIGH (ref 0.50–1.35)
GFR calc Af Amer: 31 mL/min — ABNORMAL LOW (ref >90)
GFR calc non Af Amer: 27 mL/min — ABNORMAL LOW (ref >90)

## 2011-08-20 LAB — CBC
HCT: 35.3 % — ABNORMAL LOW (ref 39.0–52.0)
MCHC: 33.4 g/dL (ref 30.0–36.0)
MCV: 82.3 fL (ref 78.0–100.0)
RDW: 13.6 % (ref 11.5–15.5)

## 2011-08-20 LAB — CARDIAC PANEL(CRET KIN+CKTOT+MB+TROPI)
Relative Index: 1.4 (ref 0.0–2.5)
Relative Index: 1.9 (ref 0.0–2.5)
Total CK: 119 U/L (ref 7–232)

## 2011-08-20 LAB — GLUCOSE, CAPILLARY: Glucose-Capillary: 153 mg/dL — ABNORMAL HIGH (ref 70–99)

## 2011-08-20 MED ORDER — ALPRAZOLAM 0.5 MG PO TABS: 1.0000 mg | ORAL_TABLET | Freq: Once | ORAL | Status: AC | PRN

## 2011-08-20 MED ORDER — SODIUM CHLORIDE 0.9 % IV SOLN: 250.0000 mL | INTRAVENOUS | Status: DC

## 2011-08-20 NOTE — Progress Notes (Signed)
Patient ID: John Hines, male   DOB: [birth year], 77 y.o.   MRN: [id number] Subjective: Patient seen.Denies any complaints  Objective: Weight change:   Intake/Output Summary (Last 24 hours) at 08/20/11 1128 Last data filed at 08/19/11 2300  Gross per 24 hour  Intake     60 ml  Output      0 ml  Net     60 ml   BP 119/63  Pulse 61  Temp(Src) 98.2 F (36.8 C) (Oral)  Resp 20  Ht 5' 7.5" (1.715 m)  Wt 76.4 kg (168 lb 6.9 oz)  BMI 25.99 kg/m2  SpO2 100% Physical Exam: General appearance: alert, cooperative and no distress Head: Normocephalic, without obvious abnormality, atraumatic Neck: no adenopathy, no carotid bruit, no JVD, supple, symmetrical, trachea midline and thyroid not enlarged, symmetric, no tenderness/mass/nodules Lungs: clear to auscultation bilaterally Heart: regular rate and rhythm, S1, S2 normal, no murmur, click, rub or gallop Abdomen: soft, non-tender; bowel sounds normal; no masses,  no organomegaly Extremities: extremities normal, atraumatic, no cyanosis or edema Skin: Skin color, texture, turgor normal. No rashes or lesions  Lab Results: Results for orders placed during the hospital encounter of 08/19/11 (from the past 48 hour(s))  CBC     Status: Abnormal   Collection Time   08/19/11 10:10 AM      Component Value Range Comment   WBC 7.0  4.0 - 10.5 (K/uL)    RBC 4.53  4.22 - 5.81 (MIL/uL)    Hemoglobin 12.5 (*) 13.0 - 17.0 (g/dL)    HCT 37.6 (*) 39.0 - 52.0 (%)    MCV 83.0  78.0 - 100.0 (fL)    MCH 27.6  26.0 - 34.0 (pg)    MCHC 33.2  30.0 - 36.0 (g/dL)    RDW 13.6  11.5 - 15.5 (%)    Platelets 192  150 - 400 (K/uL)   DIFFERENTIAL     Status: Normal   Collection Time   08/19/11 10:10 AM      Component Value Range Comment   Neutrophils Relative 64  43 - 77 (%)    Neutro Abs 4.5  1.7 - 7.7 (K/uL)    Lymphocytes Relative 24  12 - 46 (%)    Lymphs Abs 1.7  0.7 - 4.0 (K/uL)    Monocytes Relative 11  3 - 12 (%)    Monocytes Absolute 0.7  0.1 -  1.0 (K/uL)    Eosinophils Relative 1  0 - 5 (%)    Eosinophils Absolute 0.1  0.0 - 0.7 (K/uL)    Basophils Relative 0  0 - 1 (%)    Basophils Absolute 0.0  0.0 - 0.1 (K/uL)   BASIC METABOLIC PANEL     Status: Abnormal   Collection Time   08/19/11 10:10 AM      Component Value Range Comment   Sodium 138  135 - 145 (mEq/L)    Potassium 4.3  3.5 - 5.1 (mEq/L)    Chloride 102  96 - 112 (mEq/L)    CO2 28  19 - 32 (mEq/L)    Glucose, Bld 171 (*) 70 - 99 (mg/dL)    BUN 28 (*) 6 - 23 (mg/dL)    Creatinine, Ser 2.20 (*) 0.50 - 1.35 (mg/dL)    Calcium 9.2  8.4 - 10.5 (mg/dL)    GFR calc non Af Amer 27 (*) >90 (mL/min)    GFR calc Af Amer 31 (*) >90 (mL/min)   POCT I-STAT TROPONIN I  Status: Normal   Collection Time   08/19/11 10:30 AM      Component Value Range Comment   Troponin i, poc 0.00  0.00 - 0.08 (ng/mL)    Comment 3            GLUCOSE, CAPILLARY     Status: Normal   Collection Time   08/19/11  1:02 PM      Component Value Range Comment   Glucose-Capillary 92  70 - 99 (mg/dL)    Comment 1 Notify RN     GLUCOSE, CAPILLARY     Status: Abnormal   Collection Time   08/19/11  5:34 PM      Component Value Range Comment   Glucose-Capillary 174 (*) 70 - 99 (mg/dL)   TSH     Status: Normal   Collection Time   08/19/11  6:30 PM      Component Value Range Comment   TSH 1.444  0.350 - 4.500 (uIU/mL)   HEMOGLOBIN A1C     Status: Abnormal   Collection Time   08/19/11  6:30 PM      Component Value Range Comment   Hemoglobin A1C 7.1 (*) <5.7 (%)    Mean Plasma Glucose 157 (*) <117 (mg/dL)   PRO B NATRIURETIC PEPTIDE     Status: Normal   Collection Time   08/19/11  6:31 PM      Component Value Range Comment   BNP, POC 145.8  0 - 450 (pg/mL)   GLUCOSE, CAPILLARY     Status: Abnormal   Collection Time   08/19/11  8:14 PM      Component Value Range Comment   Glucose-Capillary 152 (*) 70 - 99 (mg/dL)    Comment 1 Notify RN      Comment 2 Documented in Chart     GLUCOSE,  CAPILLARY     Status: Abnormal   Collection Time   08/19/11 10:16 PM      Component Value Range Comment   Glucose-Capillary 136 (*) 70 - 99 (mg/dL)   CARDIAC PANEL(CRET KIN+CKTOT+MB+TROPI)     Status: Normal   Collection Time   08/20/11 12:22 AM      Component Value Range Comment   Total CK 119  7 - 232 (U/L)    CK, MB 1.7  0.3 - 4.0 (ng/mL)    Troponin I <0.30  <0.30 (ng/mL)    Relative Index 1.4  0.0 - 2.5    CBC     Status: Abnormal   Collection Time   08/20/11  6:07 AM      Component Value Range Comment   WBC 6.3  4.0 - 10.5 (K/uL)    RBC 4.29  4.22 - 5.81 (MIL/uL)    Hemoglobin 11.8 (*) 13.0 - 17.0 (g/dL)    HCT 35.3 (*) 39.0 - 52.0 (%)    MCV 82.3  78.0 - 100.0 (fL)    MCH 27.5  26.0 - 34.0 (pg)    MCHC 33.4  30.0 - 36.0 (g/dL)    RDW 13.6  11.5 - 15.5 (%)    Platelets 180  150 - 400 (K/uL)   BASIC METABOLIC PANEL     Status: Abnormal   Collection Time   08/20/11  6:07 AM      Component Value Range Comment   Sodium 138  135 - 145 (mEq/L)    Potassium 4.2  3.5 - 5.1 (mEq/L)    Chloride 103  96 - 112 (mEq/L)    CO2  27  19 - 32 (mEq/L)    Glucose, Bld 124 (*) 70 - 99 (mg/dL)    BUN 42 (*) 6 - 23 (mg/dL)    Creatinine, Ser 2.23 (*) 0.50 - 1.35 (mg/dL)    Calcium 9.0  8.4 - 10.5 (mg/dL)    GFR calc non Af Amer 27 (*) >90 (mL/min)    GFR calc Af Amer 31 (*) >90 (mL/min)   GLUCOSE, CAPILLARY     Status: Abnormal   Collection Time   08/20/11  7:45 AM      Component Value Range Comment   Glucose-Capillary 122 (*) 70 - 99 (mg/dL)   CARDIAC PANEL(CRET KIN+CKTOT+MB+TROPI)     Status: Normal   Collection Time   08/20/11  8:46 AM      Component Value Range Comment   Total CK 107  7 - 232 (U/L)    CK, MB 2.0  0.3 - 4.0 (ng/mL)    Troponin I <0.30  <0.30 (ng/mL)    Relative Index 1.9  0.0 - 2.5      Micro Results: No results found for this or any previous visit (from the past 240 hour(s)).  Studies/Results: Dg Chest 2 View  08/19/2011  *RADIOLOGY REPORT*  Clinical  Data: Syncope.  Hypertension.  CHEST - 2 VIEW  Comparison: 05/24/2010.  Findings: Mildly enlarged cardiac silhouette with a mild increase in size.  Clear lungs with normal vascularity.  Mild thoracic spine degenerative changes.  IMPRESSION: Mild cardiomegaly with mild progression.  Original Report Authenticated By: Gerald Stabs, M.D.   Ct Head Wo Contrast  08/19/2011  *RADIOLOGY REPORT*  Clinical Data: Syncope  CT HEAD WITHOUT CONTRAST  Technique:  Contiguous axial images were obtained from the base of the skull through the vertex without contrast.  Comparison: 01/23/2010, 04/12/2010  Findings: Diffuse brain atrophy noted with prominence of the anterior frontal CSF spaces.  Remote right cerebellar infarct.  No acute intracranial hemorrhage, definite acute infarction, midline shift, herniation, hydrocephalus, or extra-axial hemorrhage.  Gray- white matter differentiation maintained.  Cisterns patent.  No orbital abnormality.  Mastoids and sinuses clear.  IMPRESSION: Stable atrophy.  No acute intracranial finding.  Remote right cerebellar infarct.  Original Report Authenticated By: Jerilynn Mages. Daryll Brod, M.D.   Medications: Scheduled Meds:   . amLODipine  5 mg Oral Daily  . aspirin EC  81 mg Oral Daily  . carvedilol  12.5 mg Oral BID WC  . febuxostat  40 mg Oral Daily  . insulin aspart  0-15 Units Subcutaneous TID WC  . insulin aspart  0-5 Units Subcutaneous QHS  . insulin aspart  4 Units Subcutaneous TID WC  . olmesartan  40 mg Oral Daily  . oxyCODONE-acetaminophen  1 tablet Oral Once  . pantoprazole  40 mg Oral Q1200  . simvastatin  20 mg Oral Daily  . sodium chloride  3 mL Intravenous Q12H  . DISCONTD: amLODipine-olmesartan  1 tablet Oral Daily   Continuous Infusions:   . sodium chloride     PRN Meds:.acetaminophen, acetaminophen, ALPRAZolam, alum & mag hydroxide-simeth, ondansetron (ZOFRAN) IV, ondansetron, senna, sodium chloride, zolpidem  Assessment/Plan: #1 syncope-stable.Will order  carotid duplex #2 hypertension-Bp is stable #3 hyperlipidemia-continue zocor #4   LOS: 1 day   Jonelle Bann 08/20/2011, 11:28 AM

## 2011-08-20 NOTE — Progress Notes (Signed)
Clinical Education officer, museum received referral for [employer]. Clinical Education officer, museum met with pt and pt wife at bedside and completed psychosocial assessment which can be found in shadow chart. Clinical Social Worker provided Garment/textile technologist and pt stated that he plans to review packet and determine if he would like to complete any portions of the packet during this hospitalization. Clinical Social Worker to follow-up in regard to [employer].  Drake Leach, MSW, [place] [facility] [phone]

## 2011-08-20 NOTE — ED Provider Notes (Signed)
Medical screening examination/treatment/procedure(s) were conducted as a shared visit with non-physician practitioner(s) and myself.  I personally evaluated the patient during the encounter Pt with syncope without prior hx of syncope who feels fine now.  No palpatations or cp/sob.  Will admit for obs.  Blanchie Dessert, MD 08/20/11 [phone]

## 2011-08-20 NOTE — Progress Notes (Signed)
Nutrition Education  Patient with questions regarding diet to control gout and overall healthy diet.   Food and nutrition related knowledge deficit r/t gout, general healthful diet AEB patient report of no prior education.   Intervention: Discussed with patient and wife a low purine diet to control gout. We also discussed a heart healthy diet, label reading, and meal planning. Handouts were provided to the patient.   RD available to answer any additional questions the patient may have.   Larey Seat, RD, LDN

## 2011-08-20 NOTE — Progress Notes (Signed)
*  PRELIMINARY RESULT* Carotid Doppler has been performed. Right ICA: no stenosis. Left ICA: 40-59% stenosis, higher velocities not supported by plaque morphology. Bilateral vertebral's: antegrade flow.   John Hines, John Hines 08/20/2011, 4:13 PM

## 2011-08-20 NOTE — Progress Notes (Signed)
  Echocardiogram 2D Echocardiogram has been performed.  John Hines 08/20/2011, 1:05 PM

## 2011-08-20 NOTE — Progress Notes (Signed)
Utilization review complete 

## 2011-08-21 ENCOUNTER — Observation Stay (HOSPITAL_COMMUNITY): Payer: PRIVATE HEALTH INSURANCE

## 2011-08-21 DIAGNOSIS — R55 Syncope and collapse: Secondary | ICD-10-CM

## 2011-08-21 DIAGNOSIS — R739 Hyperglycemia, unspecified: Secondary | ICD-10-CM

## 2011-08-21 DIAGNOSIS — N183 Chronic kidney disease, stage 3 unspecified: Secondary | ICD-10-CM

## 2011-08-21 DIAGNOSIS — I447 Left bundle-branch block, unspecified: Secondary | ICD-10-CM

## 2011-08-21 LAB — CBC
HCT: 37.9 % — ABNORMAL LOW (ref 39.0–52.0)
MCV: 83.1 fL (ref 78.0–100.0)
Platelets: 211 10*3/uL (ref 150–400)
RBC: 4.56 MIL/uL (ref 4.22–5.81)
WBC: 6.1 10*3/uL (ref 4.0–10.5)

## 2011-08-21 LAB — COMPREHENSIVE METABOLIC PANEL
AST: 13 U/L (ref 0–37)
BUN: 42 mg/dL — ABNORMAL HIGH (ref 6–23)
CO2: 26 mEq/L (ref 19–32)
Chloride: 102 mEq/L (ref 96–112)
Creatinine, Ser: 2.08 mg/dL — ABNORMAL HIGH (ref 0.50–1.35)
GFR calc non Af Amer: 29 mL/min — ABNORMAL LOW (ref >90)
Total Bilirubin: 0.4 mg/dL (ref 0.3–1.2)

## 2011-08-21 MED ORDER — OXYCODONE-ACETAMINOPHEN 5-325 MG PO TABS
1.0000 | ORAL_TABLET | Freq: Once | ORAL | Status: AC
  Administered 2011-08-21: 1 via ORAL
  Filled 2011-08-21: qty 1

## 2011-08-21 NOTE — Discharge Summary (Signed)
Physician Discharge Summary  John Hines [id number] DOB: 04-14-1932 DOA: 08/19/2011  PCP: Karis Juba, MD, MD  Admit date: 08/19/2011 Discharge date: 08/21/2011  Discharge Diagnoses:  1. Syncope, vasovagal 2. Hyperglycemia, likely medication induced 3. Chronic left bundle branch block 4. Chronic left ventricular systolic dysfunction  Discharge Condition: Improved.  Disposition: Home.  History of present illness:  77 year old man presents the emergency department via EMS for evaluation after a syncopal episode while standing in the kitchen. Witnessed by son. Systolic blood pressure was 80 as recorded by EMS. By report the patient felt lightheaded, nauseous then hot and sweaty prior to passing out. There is no seizure activity or bowel or bladder incontinence. The patient was admitted for syncope evaluation.  Hospital Course:  1. Syncope: The admitting physician indicated an MRI would be performed. It is not clear why why this was not performed however I do not feel that this would be fruitful at this point. Patient's symptomatology and history is most suggestive of vasovagal syncope. Additionally the patient reports poor oral fluid intake for 3 days prior to admission as well as a severely restricted diet of raisins only. 2. Left bundle-branch block: Old. Per his primary care physician's office: EKG April 2007 demonstrated sinus bradycardia with left bundle branch block. 3. Borderline left ventricle function with diffuse hypokinesis: Per primary care physician's office: Patient a 2-D echocardiogram done October 2011 which demonstrated a left ventricular ejection fraction 25-35%. Conduction abnormality with septal wall motion abnormality was demonstrated as well. Patient is followed by Dr. Shelva Majestic at [facility].  4. Chronic kidney disease stage II/III: Stable. 5. Hyperglycemia/hemoglobin A1c 7.1: Fasting blood sugar 120 and 124. However patient has been on  hydrochlorothiazide. Would at this point recommend close outpatient followup for observation of blood sugars off this medication. 6. Hypertension: Stable. Hydrochlorothiazide discontinued secondary to history of gout. 7. Gout: Hydrochlorothiazide discontinued secondary to this diagnosis. 8. Anemia: Stable. Followup as an outpatient.  Consultants:  Nutrition: Low purine diet to control gout. Heart healthy diet.  Procedures:  2-D echocardiogram November 27: Left ventricular ejection fraction 45-50%. Diffuse hypokinesis and incorporate septal motion. Grade 1 diastolic dysfunction.  Bilateral carotid Doppler November 27: Preliminary report: Right ICA: no stenosis. Left ICA: 40-59% stenosis, higher velocities not supported by plaque morphology. Bilateral vertebral's: antegrade flow.   EKG November 26: Normal sinus rhythm, left bundle branch block. No old EKG available for comparison.  Discharge Instructions  Discharge Orders    Future Orders Please Complete By Expires   Diet - low sodium heart healthy      Increase activity slowly      Discharge instructions      Comments:   Be sure to drink enough fluids and maintaining nutrition on a daily basis.     Current Discharge Medication List    CONTINUE these medications which have NOT CHANGED   Details  amLODipine-olmesartan (AZOR) 5-40 MG per tablet Take 1 tablet by mouth daily.     aspirin EC 81 MG tablet Take 81 mg by mouth daily.      atorvastatin (LIPITOR) 40 MG tablet Take 40 mg by mouth at bedtime.      carvedilol (COREG) 12.5 MG tablet Take 12.5 mg by mouth 2 (two) times daily with a meal.        STOP taking these medications     hydrochlorothiazide 25 MG tablet        Follow-up Information    Follow up with Karis Juba, MD  in 2 weeks.   Contact information:   [place] [phone]       Follow up with Troy Sine, MD. (Keep scheduled appointment.)    Contact  information:   [address] [place] [phone]         Time coordinating discharge: 35 minutes.  The results of significant diagnostics from this hospitalization (including imaging, microbiology, ancillary and laboratory) are listed below for reference.    Significant Diagnostic Studies: Dg Chest 2 View  08/19/2011  *RADIOLOGY REPORT*  Clinical Data: Syncope.  Hypertension.  CHEST - 2 VIEW  Comparison: 05/24/2010.  Findings: Mildly enlarged cardiac silhouette with a mild increase in size.  Clear lungs with normal vascularity.  Mild thoracic spine degenerative changes.  IMPRESSION: Mild cardiomegaly with mild progression.  Original Report Authenticated By: Gerald Stabs, M.D.   Ct Head Wo Contrast  08/19/2011  *RADIOLOGY REPORT*  Clinical Data: Syncope  CT HEAD WITHOUT CONTRAST  Technique:  Contiguous axial images were obtained from the base of the skull through the vertex without contrast.  Comparison: 01/23/2010, 04/12/2010  Findings: Diffuse brain atrophy noted with prominence of the anterior frontal CSF spaces.  Remote right cerebellar infarct.  No acute intracranial hemorrhage, definite acute infarction, midline shift, herniation, hydrocephalus, or extra-axial hemorrhage.  Gray- white matter differentiation maintained.  Cisterns patent.  No orbital abnormality.  Mastoids and sinuses clear.  IMPRESSION: Stable atrophy.  No acute intracranial finding.  Remote right cerebellar infarct.  Original Report Authenticated By: Jerilynn Mages. Daryll Brod, M.D.   Labs: Basic Metabolic Panel:  Lab [id number] 08/20/11 0607 08/19/11 1010  NA 139 138 138  K 4.7 4.2 --  CL 102 103 102  CO2 26 27 28   GLUCOSE 120* 124* 171*  BUN 42* 42* 28*  CREATININE 2.08* 2.23* 2.20*  CALCIUM 9.5 9.0 9.2  MG -- -- --  PHOS -- -- --   Liver Function Tests:  Lab 08/21/11 0555  AST 13  ALT 12  ALKPHOS 74  BILITOT 0.4  PROT 7.3  ALBUMIN 3.2*   CBC:  Lab 08/21/11  0555 08/20/11 0607 08/19/11 1010  WBC 6.1 6.3 7.0  NEUTROABS -- -- 4.5  HGB 12.3* 11.8* 12.5*  HCT 37.9* 35.3* 37.6*  MCV 83.1 82.3 83.0  PLT 211 180 192   Cardiac Enzymes:  Lab 08/20/11 1538 08/20/11 0846 08/20/11 0022  CKTOTAL 96 107 119  CKMB 2.0 2.0 1.7  CKMBINDEX -- -- --  TROPONINI <0.30 <0.30 <0.30   BNP:  Lab 08/19/11 1831  POCBNP 145.8   CBG:  Lab 08/21/11 1133 08/21/11 0747 08/20/11 2152 08/20/11 1656 08/20/11 1158  GLUCAP 146* 120* 153* 89 77    Signed:  Murray Hodgkins, MD  [facility] 08/21/2011, 1:30 PM

## 2011-08-21 NOTE — Progress Notes (Signed)
PROGRESS NOTE  John Hines [id number] DOB: Nov 19, 1931 DOA: 08/19/2011 PCP: Clois Dupes, MD, MD Cardiologist: [place], M.D.  Brief narrative: 77 year old man presents the emergency department via EMS for evaluation after a syncopal episode while standing in the kitchen. Witnessed by son. Systolic blood pressure was 80 as recorded by EMS. By report the patient felt lightheaded, nauseous then hot and sweaty prior to passing out. There is no seizure activity or bowel or bladder incontinence. The patient was admitted for syncope evaluation.  Past medical history: Gout, BPH, hyperlipidemia, hypertension, CVA.  Consultants:  Nutrition: Low purine diet to control gout. Heart healthy diet.  Procedures:  2-D echocardiogram November 27: Left ventricular ejection fraction 45-50%. Diffuse hypokinesis and incorporate septal motion. Grade 1 diastolic dysfunction.  Bilateral carotid Doppler November 27: Preliminary report: Right ICA: no stenosis. Left ICA: 40-59% stenosis, higher velocities not supported by plaque morphology. Bilateral vertebral's: antegrade flow.   EKG November 26: Normal sinus rhythm, left bundle branch block. No old EKG available for comparison.  Interim History: No nursing concerns. Subjective: Feels great. No further syncope.  Objective: Filed Vitals:   08/21/11 0534  BP: 137/85  Pulse: 74  Temp: 98.2 F (36.8 C)  Resp: 20   Blood pressure 137/85, pulse 74, temperature 98.2 F (36.8 C), temperature source Oral, resp. rate 20, height 5' 7.5" (1.715 m), weight 76.4 kg (168 lb 6.9 oz), SpO2 98.00%.   Intake/Output Summary (Last 24 hours) at 08/21/11 1232 Last data filed at 08/21/11 0900  Gross per 24 hour  Intake    358 ml  Output      0 ml  Net    358 ml    Exam: General: Well-appearing. Cardiovascular: Regular rate and rhythm. No murmur, rub or gallop. Respiratory: Clear to auscultation bilaterally. No wheezes, rales or rhonchi.  Data  Reviewed: Basic Metabolic Panel:  Lab [id number] 08/20/11 0607 08/19/11 1010  NA 139 138 138  K 4.7 4.2 --  CL 102 103 102  CO2 26 27 28   GLUCOSE 120* 124* 171*  BUN 42* 42* 28*  CREATININE 2.08* 2.23* 2.20*  CALCIUM 9.5 9.0 9.2  MG -- -- --  PHOS -- -- --   Liver Function Tests:  Lab 08/21/11 0555  AST 13  ALT 12  ALKPHOS 74  BILITOT 0.4  PROT 7.3  ALBUMIN 3.2*   CBC:  Lab 08/21/11 0555 08/20/11 0607 08/19/11 1010  WBC 6.1 6.3 7.0  NEUTROABS -- -- 4.5  HGB 12.3* 11.8* 12.5*  HCT 37.9* 35.3* 37.6*  MCV 83.1 82.3 83.0  PLT 211 180 192   Cardiac Enzymes:  Lab 08/20/11 1538 08/20/11 0846 08/20/11 0022  CKTOTAL 96 107 119  CKMB 2.0 2.0 1.7  CKMBINDEX -- -- --  TROPONINI <0.30 <0.30 <0.30   BNP:  Lab 08/19/11 1831  POCBNP 145.8   CBG:  Lab 08/21/11 1133 08/21/11 0747 08/20/11 2152 08/20/11 1656 08/20/11 1158  GLUCAP 146* 120* 153* 89 77    Studies: Dg Chest 2 View  08/19/2011  *RADIOLOGY REPORT*  Clinical Data: Syncope.  Hypertension.  CHEST - 2 VIEW  Comparison: 05/24/2010.  Findings: Mildly enlarged cardiac silhouette with a mild increase in size.  Clear lungs with normal vascularity.  Mild thoracic spine degenerative changes.  IMPRESSION: Mild cardiomegaly with mild progression.  Original Report Authenticated By: Gerald Stabs, M.D.   Ct Head Wo Contrast  08/19/2011  *RADIOLOGY REPORT*  Clinical Data: Syncope  CT HEAD WITHOUT CONTRAST  Technique:  Contiguous  axial images were obtained from the base of the skull through the vertex without contrast.  Comparison: 01/23/2010, 04/12/2010  Findings: Diffuse brain atrophy noted with prominence of the anterior frontal CSF spaces.  Remote right cerebellar infarct.  No acute intracranial hemorrhage, definite acute infarction, midline shift, herniation, hydrocephalus, or extra-axial hemorrhage.  Gray- white matter differentiation maintained.  Cisterns patent.  No orbital abnormality.  Mastoids and sinuses clear.   IMPRESSION: Stable atrophy.  No acute intracranial finding.  Remote right cerebellar infarct.  Original Report Authenticated By: Jerilynn Mages. Daryll Brod, M.D.    Scheduled Meds:   . amLODipine  5 mg Oral Daily  . aspirin EC  81 mg Oral Daily  . carvedilol  12.5 mg Oral BID WC  . febuxostat  40 mg Oral Daily  . insulin aspart  0-15 Units Subcutaneous TID WC  . insulin aspart  0-5 Units Subcutaneous QHS  . insulin aspart  4 Units Subcutaneous TID WC  . olmesartan  40 mg Oral Daily  . oxyCODONE-acetaminophen  1 tablet Oral Once  . pantoprazole  40 mg Oral Q1200  . simvastatin  20 mg Oral Daily  . sodium chloride  3 mL Intravenous Q12H   Continuous Infusions:   . sodium chloride       Assessment/Plan: 1. Syncope: The admitting physician indicated an MRI would be performed. It is not clear why why this was not performed however I do not feel that this would be fruitful at this point. Patient's symptomatology and history is most suggestive of vasovagal syncope. 2. Left bundle-branch block: Old. Per his primary care physician's office: EKG April 2007 demonstrated sinus bradycardia with left bundle branch block. 3. Borderline left ventricle function with diffuse hypokinesis: Per primary care physician's office: Patient a 2-D echocardiogram done October 2011 which demonstrated a left ventricular ejection fraction 25-35%. Conduction abnormality with septal wall motion abnormality was demonstrated as well. Patient is followed by Dr. Shelva Majestic at [facility].  4. Chronic kidney disease stage II/III: Stable. 5. Hyperglycemia/hemoglobin A1c 7.1: Fasting blood sugar 120 and 124. However patient has been on hydrochlorothiazide. Would at this point recommend close outpatient followup for observation of blood sugars off this medication. 6. Hypertension: Stable. Hydrochlorothiazide discontinued secondary to history of gout. 7. Gout: Hydrochlorothiazide discontinued secondary to this  diagnosis. 8. Anemia: Stable. Followup as an outpatient.  Plan discharge home today.  Murray Hodgkins, MD  [facility] Pager [phone] 08/21/2011, 12:32 PM    LOS: 2 days

## 2011-08-21 NOTE — Progress Notes (Signed)
Clinical Social Work followed up with pt in regard to [employer]. Pt stated that he had not yet had a chance to review Advanced Directives packet with his wife and family members and would review packet at home and have notarized in the community if necessary. Pt discharging to home today. No further social work needs at this time. CSW signing off.  Drake Leach, MSW, [place] [facility] [phone]

## 2012-12-25 ENCOUNTER — Telehealth: Payer: Self-pay | Admitting: Physician Assistant

## 2012-12-25 MED ORDER — AMLODIPINE-OLMESARTAN 5-40 MG PO TABS: 1.0000 | ORAL_TABLET | Freq: Every day | ORAL | Status: DC

## 2012-12-25 NOTE — Telephone Encounter (Signed)
Medication refilled per protocol.Patient needs to be seen before any further refills 

## 2013-01-08 ENCOUNTER — Other Ambulatory Visit: Payer: Self-pay | Admitting: Family Medicine

## 2013-02-15 ENCOUNTER — Telehealth: Payer: Self-pay | Admitting: Physician Assistant

## 2013-02-16 NOTE — Telephone Encounter (Signed)
Pt needs to be seen and have been unable to reach by phone or mail.  Refill denied

## 2013-02-17 ENCOUNTER — Telehealth: Payer: Self-pay | Admitting: Physician Assistant

## 2013-02-17 MED ORDER — ATORVASTATIN CALCIUM 40 MG PO TABS: 40.0000 mg | ORAL_TABLET | Freq: Every day | ORAL | Status: DC

## 2013-02-17 NOTE — Telephone Encounter (Signed)
Medication refilled per protocol.Patient needs to be seen before any further refills  Called pt home and left mess needs to make appt for OV/labs

## 2013-02-25 ENCOUNTER — Telehealth: Payer: Self-pay | Admitting: Physician Assistant

## 2013-02-25 MED ORDER — AMLODIPINE-OLMESARTAN 5-40 MG PO TABS: 1.0000 | ORAL_TABLET | Freq: Every day | ORAL | Status: DC

## 2013-02-25 NOTE — Telephone Encounter (Signed)
Medication refilled per protocol.Patient needs to be seen before any further refills 

## 2013-03-08 ENCOUNTER — Other Ambulatory Visit: Payer: Self-pay | Admitting: Family Medicine

## 2013-03-08 ENCOUNTER — Telehealth: Payer: Self-pay | Admitting: Physician Assistant

## 2013-03-08 MED ORDER — AMLODIPINE-OLMESARTAN 5-40 MG PO TABS: 1.0000 | ORAL_TABLET | Freq: Every day | ORAL | Status: DC

## 2013-03-08 MED ORDER — ATORVASTATIN CALCIUM 40 MG PO TABS: 40.0000 mg | ORAL_TABLET | Freq: Every day | ORAL | Status: DC

## 2013-03-08 NOTE — Telephone Encounter (Signed)
Medication refilled per protocol.Patient needs to be seen before any further refills  Patient aware appt is needed

## 2013-03-11 ENCOUNTER — Ambulatory Visit (INDEPENDENT_AMBULATORY_CARE_PROVIDER_SITE_OTHER): Payer: Medicare Other | Admitting: Physician Assistant

## 2013-03-11 ENCOUNTER — Encounter: Payer: Self-pay | Admitting: Physician Assistant

## 2013-03-11 VITALS — BP 148/90 | HR 68 | Temp 98.6°F | Resp 18 | Ht 65.5 in | Wt 176.0 lb

## 2013-03-11 DIAGNOSIS — E119 Type 2 diabetes mellitus without complications: Secondary | ICD-10-CM

## 2013-03-11 DIAGNOSIS — M5431 Sciatica, right side: Secondary | ICD-10-CM

## 2013-03-11 DIAGNOSIS — N183 Chronic kidney disease, stage 3 unspecified: Secondary | ICD-10-CM

## 2013-03-11 DIAGNOSIS — I10 Essential (primary) hypertension: Secondary | ICD-10-CM

## 2013-03-11 DIAGNOSIS — N529 Male erectile dysfunction, unspecified: Secondary | ICD-10-CM

## 2013-03-11 DIAGNOSIS — M5430 Sciatica, unspecified side: Secondary | ICD-10-CM

## 2013-03-11 DIAGNOSIS — R739 Hyperglycemia, unspecified: Secondary | ICD-10-CM

## 2013-03-11 DIAGNOSIS — I5022 Chronic systolic (congestive) heart failure: Secondary | ICD-10-CM

## 2013-03-11 DIAGNOSIS — M109 Gout, unspecified: Secondary | ICD-10-CM

## 2013-03-11 DIAGNOSIS — E559 Vitamin D deficiency, unspecified: Secondary | ICD-10-CM

## 2013-03-11 DIAGNOSIS — E785 Hyperlipidemia, unspecified: Secondary | ICD-10-CM

## 2013-03-11 DIAGNOSIS — R7309 Other abnormal glucose: Secondary | ICD-10-CM

## 2013-03-11 MED ORDER — SILDENAFIL CITRATE 100 MG PO TABS: 100.0000 mg | ORAL_TABLET | Freq: Every day | ORAL | Status: DC | PRN

## 2013-03-11 MED ORDER — PREDNISONE 20 MG PO TABS: ORAL_TABLET | ORAL | Status: DC

## 2013-03-11 NOTE — Progress Notes (Signed)
Patient ID: John Hines MRN: [id number], DOB: 06-28-1932, 79 y.o. Date of Encounter: @DATE @  Chief Complaint:  Chief Complaint  Patient presents with  . routine check up    HPI: 78 y.o. year old AA male  presents for routine f/u OV. Has been full year since LOV. He used to come very regularly. Says he had lapse b/c of problem with insurance coverage. Says he has been feeling fine and has had no problems.  Has not seen any other doctors since [name] here except for his annual appointment with [facility]-Dr. Lorrene Hines. Saw her 04/2012-to see her annually.  He has not seen Dr. Claiborne Billings Cardiologist recently. Thinks he was told that he did not need f/u. ?? Nonetheless, he says he is taking all meds as directed/as listed. Has no adverse effects.  Has had no SOB/DOE. No chest pressure, heaviness, tightness-even with exertion.  Has had no gout flare.  Has no myalgias with lipitor.   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 Wakes up at 2:30am -starts work at [place] off at 11:30am.   Does have one complaint. Feels achy pain in lateral aspect of right thigh for about one hour in the mornings. Feels it while he is taking shower etc. Stays there for one hour then spontaneously resolves. He dose nothing to make it go away. He does not feel it during the day or while sleeping at night- only for one hour in morning when first mocving around. Has no h/o low back pain. Says sometimes at work, with certain positions, feels some achiness in right low back.  No numbness, tingling, weakness down leg.    Past Medical History  Diagnosis Date  . ED (erectile dysfunction)   . BPH (benign prostatic hyperplasia)   . Hyperlipidemia   . Hypertension   . Dyslipidemia   . Arthritis   . Gout   . Heart murmur   . Dysrhythmia 08/19/11    "low heart beat; maybe 25bpm; got RX; now 60bpm"  . Stroke     "mild stroke; dx'd 2010; don't know when it happened"  . Syncope and collapse 08/19/11     "shallow breathing; sweating horribly"     Home Meds: See attached medication section for current medication list. Any medications entered into computer today will not appear on this note's list. The medications listed below were entered prior to today. Current Outpatient Prescriptions on File Prior to Visit  Medication Sig Dispense Refill  . amLODipine-olmesartan (AZOR) 5-40 MG per tablet Take 1 tablet by mouth daily.  30 tablet  0  . aspirin EC 81 MG tablet Take 81 mg by mouth daily.        Marland Kitchen atorvastatin (LIPITOR) 40 MG tablet Take 1 tablet (40 mg total) by mouth at bedtime.  30 tablet  0  . carvedilol (COREG) 12.5 MG tablet TAKE 1 TABLET BY MOUTH TWICE A DAY WITH MEALS *DUE FOR MD OFFICE VISIT!*  60 tablet  0  . [facility] IRON 325 (65 FE) MG tablet TAKE 1 TABLET BY MOUTH EVERY DAY  30 tablet  5   No current facility-administered medications on file prior to visit.    Allergies:  Allergies  Allergen Reactions  . Penicillins Swelling    History   Social History  . Marital Status: Married    Spouse Name: N/A    Number of Children: N/A  . Years of Education: N/A   Occupational History  . Not on file.  Social History Main Topics  . Smoking status: Never Smoker   . Smokeless tobacco: Never Used  . Alcohol Use: Yes     Comment: "social drinker; last time 08/15/11"  . Drug Use: No  . Sexually Active: Yes   Other Topics Concern  . Not on file   Social History Narrative  . No narrative on file    No family history on file.   Review of Systems:  See HPI for pertinent ROS. All other ROS negative.    Physical Exam: Blood pressure 148/90, pulse 68, temperature 98.6 F (37 C), temperature source Oral, resp. rate 18, height 5' 5.5" (1.664 m), weight 176 lb (79.833 kg)., Body mass index is 28.83 kg/(m^2). General: WNWD AAM. Appears in no acute distress. Neck: Supple. No thyromegaly. No lymphadenopathy.No carotid bruits.  Lungs: Clear bilaterally to auscultation without  wheezes, rales, or rhonchi. Breathing is unlabored. Heart: RRR with S1 S2. I/VI murmur right 2nd ICS. No rubs, or gallops. Abdomen: Soft, non-tender, non-distended with normoactive bowel sounds. No hepatomegaly. No rebound/guarding. No obvious abdominal masses. Musculoskeletal:  Strength and tone normal for age. Low Back: No pain with palpation. Also, no pain with palpation of bilateral sciatic notches. Bilateral SLR and Hip Abduction: nml. Patella DTRs 2+, equal bilaterally.  Extremities/Skin: Warm and dry. No clubbing or cyanosis. No edema. No rashes or suspicious lesions. Neuro: Alert and oriented X 3. Moves all extremities spontaneously. Gait is normal. CNII-XII grossly in tact. Psych:  Responds to questions appropriately with a normal affect.    Pt not fasting. RTC in a.m. Fasting for all labs.   ASSESSMENT AND PLAN:  79 y.o. year old male with  1. Chronic kidney disease, stage III (moderate) Solitary Functioning Left Kidney - CBC with Differential; Future - COMPLETE METABOLIC PANEL WITH GFR; Future Due for annula f/u with Dr. Lorrene Hines in August. Pt aware.   2. Hyperlipidemia On Lipitor.  - COMPLETE METABOLIC PANEL WITH GFR; Future - Lipid panel; Future  3. Hypertension Slightly high today. He has not had it checked anywhere in quite some time. Will wait and recheck it again prior to changing meds.  - COMPLETE METABOLIC PANEL WITH GFR; Future  6. Chronic systolic heart failure-NonIschemic. Stable with no symptoms. On Coreg and ARB  7. Gout On Uloric. No flare in past year.  - Uric acid; Future  8. Erectile dysfunction - sildenafil (VIAGRA) 100 MG tablet; Take 1 tablet (100 mg total) by mouth daily as needed for erectile dysfunction.  Dispense: 6 tablet; Refill: 11  9. Right sided sciatica - predniSONE (DELTASONE) 20 MG tablet; Take 3 daily for 2 days, then 2 daily for 2 days, then 1 daily for 2 days.  Dispense: 12 tablet; Refill: 0  10. Diabetes No Metformin given CKD.  This was disagnosed 03/2011. So far, has not required medication. Has been ontrolled with diet/exercise.  - COMPLETE METABOLIC PANEL WITH GFR; Future - Hemoglobin A1c; Future - Microalbumin, urine; Future  11. Vitamin D deficiency - Vitamin D 25 hydroxy; Future  12. H/O CVA 01/2010.  13. Carotid Artery U/S 07/2011: Right ICA nml. Left ICA 40-59%. 14. Screening Colonoscopy; 10/2009-Repeat 10 years 32. Vaccines:   Pneumovax: Given 11/2011  Tetanus: Had 2007  ROV 3 months.  [address] [place], [place], [facility] 03/11/2013 4:58 PM

## 2013-03-12 ENCOUNTER — Other Ambulatory Visit (INDEPENDENT_AMBULATORY_CARE_PROVIDER_SITE_OTHER): Payer: Self-pay

## 2013-03-12 DIAGNOSIS — E119 Type 2 diabetes mellitus without complications: Secondary | ICD-10-CM

## 2013-03-12 DIAGNOSIS — R739 Hyperglycemia, unspecified: Secondary | ICD-10-CM

## 2013-03-12 DIAGNOSIS — M109 Gout, unspecified: Secondary | ICD-10-CM

## 2013-03-12 DIAGNOSIS — I10 Essential (primary) hypertension: Secondary | ICD-10-CM

## 2013-03-12 DIAGNOSIS — E559 Vitamin D deficiency, unspecified: Secondary | ICD-10-CM

## 2013-03-12 DIAGNOSIS — E785 Hyperlipidemia, unspecified: Secondary | ICD-10-CM

## 2013-03-12 LAB — CBC WITH DIFFERENTIAL/PLATELET
Basophils Absolute: 0 10*3/uL (ref 0.0–0.1)
Eosinophils Absolute: 0.2 10*3/uL (ref 0.0–0.7)
Eosinophils Relative: 4 % (ref 0–5)
Lymphocytes Relative: 35 % (ref 12–46)
MCH: 26.9 pg (ref 26.0–34.0)
MCV: 76.2 fL — ABNORMAL LOW (ref 78.0–100.0)
Platelets: 199 10*3/uL (ref 150–400)
RDW: 13.5 % (ref 11.5–15.5)
WBC: 4.8 10*3/uL (ref 4.0–10.5)

## 2013-03-12 LAB — COMPLETE METABOLIC PANEL WITH GFR
ALT: 13 U/L (ref 0–53)
AST: 14 U/L (ref 0–37)
Creat: 1.4 mg/dL — ABNORMAL HIGH (ref 0.50–1.35)
Total Bilirubin: 0.5 mg/dL (ref 0.3–1.2)

## 2013-03-12 LAB — LIPID PANEL
HDL: 41 mg/dL (ref >39)
LDL Cholesterol: 94 mg/dL (ref 0–99)
Triglycerides: 53 mg/dL (ref <150)
VLDL: 11 mg/dL (ref 0–40)

## 2013-03-12 LAB — HEMOGLOBIN A1C: Mean Plasma Glucose: 143 mg/dL — ABNORMAL HIGH (ref <117)

## 2013-03-15 ENCOUNTER — Telehealth: Payer: Self-pay | Admitting: Family Medicine

## 2013-03-15 DIAGNOSIS — Z79899 Other long term (current) drug therapy: Secondary | ICD-10-CM

## 2013-03-15 DIAGNOSIS — E785 Hyperlipidemia, unspecified: Secondary | ICD-10-CM

## 2013-03-15 MED ORDER — ATORVASTATIN CALCIUM 80 MG PO TABS: 80.0000 mg | ORAL_TABLET | Freq: Every day | ORAL | Status: DC

## 2013-03-15 NOTE — Telephone Encounter (Signed)
Message copied by Olena Mater on Mon Mar 15, 2013  4:50 PM ------      Message from: Dena Billet      Created: Sat Mar 13, 2013  7:44 AM       His cholesterol is a little higher than the goal LDL of 70. Increase Lipitor from 40mg  to 80mg . Recheck Fasting Labs in 6 weeks.       The rest of his labs look good. His kidney numbers ae actually better. His A1C is ok. No other med changes.       Would plan f/u OV 3 months (I think I told him 6 months at the OV but I think it would be best to come in 3 months. )      Order Lipior 80mg . One po QHS # 30/ 2 refills.       Place order for FLP/LFT ------

## 2013-03-16 ENCOUNTER — Encounter: Payer: Self-pay | Admitting: Family Medicine

## 2013-03-17 ENCOUNTER — Encounter: Payer: Self-pay | Admitting: Family Medicine

## 2013-03-17 NOTE — Telephone Encounter (Signed)
Pt has not returned any calls.  Letter was sent

## 2013-03-22 ENCOUNTER — Telehealth: Payer: Self-pay | Admitting: Family Medicine

## 2013-03-22 DIAGNOSIS — E785 Hyperlipidemia, unspecified: Secondary | ICD-10-CM

## 2013-03-22 DIAGNOSIS — Z79899 Other long term (current) drug therapy: Secondary | ICD-10-CM

## 2013-03-22 MED ORDER — ATORVASTATIN CALCIUM 80 MG PO TABS: 80.0000 mg | ORAL_TABLET | Freq: Every day | ORAL | Status: DC

## 2013-03-22 NOTE — Telephone Encounter (Signed)
Pt aware of BW results, rx sent in for new medication change and order for BW in Epic

## 2013-03-29 ENCOUNTER — Telehealth: Payer: Self-pay | Admitting: Physician Assistant

## 2013-03-29 NOTE — Telephone Encounter (Signed)
Patient made aware to try Tylenol for pain.  If does not help call us back

## 2013-03-29 NOTE — Telephone Encounter (Signed)
I reviewed LOV note-I gave him prednisone. Tell him it is not safe to use prednisone repeatedly. However, because of his kidneys, he cannot use NSAIDS.make sure he is aware NOT to take NSAIDS.  Ask him if tylenol will control his pain. (At Ov he was only having pain the first hour he was awake. See if he can take Tylenol Q Am and control pain with this. If not, then maybe I can do Rx for Tylenol3. (with codeine)

## 2013-03-30 ENCOUNTER — Telehealth: Payer: Self-pay | Admitting: Physician Assistant

## 2013-03-30 DIAGNOSIS — M5431 Sciatica, right side: Secondary | ICD-10-CM

## 2013-03-30 NOTE — Telephone Encounter (Signed)
This has been denied and pt is aware

## 2013-03-31 ENCOUNTER — Ambulatory Visit (INDEPENDENT_AMBULATORY_CARE_PROVIDER_SITE_OTHER): Payer: Medicare Other | Admitting: Physician Assistant

## 2013-03-31 ENCOUNTER — Encounter: Payer: Self-pay | Admitting: Physician Assistant

## 2013-03-31 ENCOUNTER — Ambulatory Visit
Admission: RE | Admit: 2013-03-31 | Discharge: 2013-03-31 | Disposition: A | Discharge status: Home / Self Care | Payer: Medicare Other | Source: Ambulatory Visit | Attending: Physician Assistant | Admitting: Physician Assistant

## 2013-03-31 VITALS — BP 144/70 | HR 68 | Temp 98.3°F | Resp 20 | Ht 66.5 in | Wt 173.0 lb

## 2013-03-31 DIAGNOSIS — M545 Low back pain: Secondary | ICD-10-CM

## 2013-03-31 MED ORDER — CYCLOBENZAPRINE HCL 10 MG PO TABS: 10.0000 mg | ORAL_TABLET | Freq: Three times a day (TID) | ORAL | Status: DC | PRN

## 2013-03-31 MED ORDER — PREDNISONE 20 MG PO TABS: ORAL_TABLET | ORAL | Status: DC

## 2013-03-31 MED ORDER — HYDROCODONE-ACETAMINOPHEN 5-325 MG PO TABS: 1.0000 | ORAL_TABLET | Freq: Four times a day (QID) | ORAL | Status: DC | PRN

## 2013-04-01 ENCOUNTER — Telehealth: Payer: Self-pay | Admitting: Family Medicine

## 2013-04-01 NOTE — Telephone Encounter (Signed)
Message copied by Olena Mater on Thu Apr 01, 2013  9:53 AM ------      Message from: Dena Billet      Created: Wed Mar 31, 2013  7:45 PM       XRay shows mild slipped disc at the area of his pain. Take the medications that I Rxed at [place] as we discussed. If pain resolves, great. If pain persists or returns, let me know and we may have to get MRI. ------

## 2013-04-01 NOTE — Telephone Encounter (Signed)
Patient returned my call.  Made aware of xray results.  Told to follow plan of care per provider at [facility].  If back does not improve call us back for further treatment

## 2013-04-01 NOTE — Progress Notes (Signed)
Patient ID: John Hines MRN: [id number], DOB: [birth year], 79 y.o. Date of Encounter: 04/01/2013, 7:38 AM    Chief Complaint:  Chief Complaint  Patient presents with  . c/o severe low back and left hip pain     HPI: 79 y.o. year old AA male presents with c/o back pain.   I saw him for OV on 03/11/13 sec to c/o pain in Right low back and right sciatica. At that time he reported that he had no prior h/o back pain. Has never had back surgery, has never had XRay,MRI of back. Has never even seeked medical eval for back pain prior to 03/11/13.  03/11/13 I treated him with prednisone. Today he reports that with the prednisone, his pain completely resolved. But then, on Thursday 03/25/13 he developed pain on the LEFT lower back. (prior was on RIGHT). Has constant minor pain in left low back. The pain gets worse with movement. When he sits down, stands up, it is the worst. He has no pain down his leg. No  Numbness, tingling, weakness in leg. No cauda equina. No incontinence.   Home Meds: See attached medication section for any medications that were entered at today's visit. The computer does not put those onto this list.The following list is a list of meds entered prior to today's visit.   Current Outpatient Prescriptions on File Prior to Visit  Medication Sig Dispense Refill  . amLODipine-olmesartan (AZOR) 5-40 MG per tablet Take 1 tablet by mouth daily.  30 tablet  0  . aspirin EC 81 MG tablet Take 81 mg by mouth daily.        Marland Kitchen atorvastatin (LIPITOR) 80 MG tablet Take 1 tablet (80 mg total) by mouth daily.  90 tablet  0  . carvedilol (COREG) 12.5 MG tablet TAKE 1 TABLET BY MOUTH TWICE A DAY WITH MEALS *DUE FOR MD OFFICE VISIT!*  60 tablet  0  . [facility] IRON 325 (65 FE) MG tablet TAKE 1 TABLET BY MOUTH EVERY DAY  30 tablet  5  . sildenafil (VIAGRA) 100 MG tablet Take 1 tablet (100 mg total) by mouth daily as needed for erectile dysfunction.  6 tablet  11   No current facility-administered  medications on file prior to visit.    Allergies:  Allergies  Allergen Reactions  . Penicillins Swelling      Review of Systems: See HPI for pertinent ROS. All other ROS negative.    Physical Exam: Blood pressure 144/70, pulse 68, temperature 98.3 F (36.8 C), temperature source Oral, resp. rate 20, height 5' 6.5" (1.689 m), weight 173 lb (78.472 kg)., Body mass index is 27.51 kg/(m^2). General: WNWD AAM. Walking with a cane today, which is not normal for him.  Appears in no acute distress. Lungs: Clear bilaterally to auscultation without wheezes, rales, or rhonchi. Breathing is unlabored. Heart: Regular rhythm.I/VI murmur 2nd ICS.  Msk:  Strength and tone normal for age. Left Low Back at L4-L5 level is area of pain and is moderately painful with palpation. There is no other area of tenderness with palpation. There is no tenderness with palpation of sciatic notch. SLR on left is limited to 45 degrees. Left hip abduction is also limited sec to pain in left low vback. Right SLR and hip abd are nml. Strenght is 5/5 in Bilteral lower extremities. 2+ Patella reflexes bilaterally. Neuro: Alert and oriented X 3. Moves all extremities spontaneously. Gait is normal. CNII-XII grossly in tact. Psych:  Responds to questions appropriately with  a normal affect.     ASSESSMENT AND PLAN:  79 y.o. year old male with  1. Left low back pain Will obtain XRay. Will avoid NSAIDS given CKD. F/U if pain does not resolve or if it recurs. O/w will f/u with pt when XRay report available. - DG Lumbar Spine Complete; Future - cyclobenzaprine (FLEXERIL) 10 MG tablet; Take 1 tablet (10 mg total) by mouth 3 (three) times daily as needed for muscle spasms.  Dispense: 30 tablet; Refill: 0 - HYDROcodone-acetaminophen (NORCO/VICODIN) 5-325 MG per tablet; Take 1 tablet by mouth every 6 (six) hours as needed for pain.  Dispense: 30 tablet; Refill: 0 - predniSONE (DELTASONE) 20 MG tablet; Take 3 daily for 2 days, then 2  daily for 2 days, then 1 daily for 2 days.  Dispense: 12 tablet; Refill: 0   Signed, [address] [place], [place], [facility] 04/01/2013 7:38 AM

## 2013-05-03 ENCOUNTER — Other Ambulatory Visit: Payer: Self-pay | Admitting: Family Medicine

## 2013-05-03 NOTE — Telephone Encounter (Signed)
Medication refilled per protocol. 

## 2013-05-15 ENCOUNTER — Other Ambulatory Visit: Payer: Self-pay | Admitting: Physician Assistant

## 2013-05-17 NOTE — Telephone Encounter (Signed)
Medication refilled per protocol. 

## 2013-05-20 DIAGNOSIS — N189 Chronic kidney disease, unspecified: Secondary | ICD-10-CM

## 2013-05-20 HISTORY — DX: Chronic kidney disease, unspecified: N18.9

## 2013-05-22 ENCOUNTER — Other Ambulatory Visit: Payer: Self-pay | Admitting: Physician Assistant

## 2013-05-25 NOTE — Telephone Encounter (Signed)
Meds refilled.

## 2013-06-14 ENCOUNTER — Encounter: Payer: Self-pay | Admitting: Physician Assistant

## 2013-06-14 DIAGNOSIS — N289 Disorder of kidney and ureter, unspecified: Secondary | ICD-10-CM | POA: Insufficient documentation

## 2013-06-14 DIAGNOSIS — K635 Polyp of colon: Secondary | ICD-10-CM | POA: Insufficient documentation

## 2013-06-14 DIAGNOSIS — I509 Heart failure, unspecified: Secondary | ICD-10-CM | POA: Insufficient documentation

## 2013-06-14 DIAGNOSIS — N189 Chronic kidney disease, unspecified: Secondary | ICD-10-CM | POA: Insufficient documentation

## 2013-06-14 DIAGNOSIS — Q600 Renal agenesis, unilateral: Secondary | ICD-10-CM | POA: Insufficient documentation

## 2013-08-04 ENCOUNTER — Other Ambulatory Visit: Payer: Self-pay | Admitting: Family Medicine

## 2013-08-12 ENCOUNTER — Ambulatory Visit (INDEPENDENT_AMBULATORY_CARE_PROVIDER_SITE_OTHER): Payer: Medicare Other | Admitting: Physician Assistant

## 2013-08-12 ENCOUNTER — Encounter: Payer: Self-pay | Admitting: Physician Assistant

## 2013-08-12 VITALS — BP 120/80 | HR 79 | Temp 97.9°F | Resp 17 | Wt 174.0 lb

## 2013-08-12 DIAGNOSIS — L259 Unspecified contact dermatitis, unspecified cause: Secondary | ICD-10-CM

## 2013-08-12 DIAGNOSIS — M545 Low back pain, unspecified: Secondary | ICD-10-CM

## 2013-08-12 DIAGNOSIS — L309 Dermatitis, unspecified: Secondary | ICD-10-CM

## 2013-08-12 HISTORY — DX: Low back pain, unspecified: M54.50

## 2013-08-12 MED ORDER — HYDROCODONE-ACETAMINOPHEN 5-325 MG PO TABS: 1.0000 | ORAL_TABLET | Freq: Four times a day (QID) | ORAL | Status: DC | PRN

## 2013-08-12 MED ORDER — TRIAMCINOLONE ACETONIDE 0.5 % EX CREA: 1.0000 "application " | TOPICAL_CREAM | Freq: Three times a day (TID) | CUTANEOUS | Status: DC

## 2013-08-12 NOTE — Progress Notes (Signed)
Patient ID: John Hines MRN: [id number], DOB: Nov 07, 1931, 79 y.o. Date of Encounter: 08/12/2013, 2:59 PM    Chief Complaint:  Chief Complaint  Patient presents with  . recheck back     HPI: 79 y.o. year old AA male is here to followup regarding low back pain.  I saw him for an office visit on 03/11/13 secondary to complaints of pain in the right low back and right sciatica. At that time he reported that he had no prior history of back pain. He had never had any back surgery and never had any x-ray or MRI of his back.   He had never even seeked medical evaluation for back pain prior to 03/11/2013.  At that visit 03/11/13 he had complaints of pain in the RIGHT low back with right sciatica.  I treated him with prednisone.  He came for followup on 04/01/13. At that time he reported that with the prednisone his pain completely resolved.  However, he reported that on 03/25/13 he developed pain in the LEFT lower back.(prior was on the RIGHT). He was complaining of a constant minor pain in the left low back. The pain was getting worse with movement. When he would sit down, stand up, it was at its worst. He is having no pain down into his leg. At that time obtain x-ray of the lumbar spine. Prescribed Flexeril and Norco 5/325. Rxed another prednisone pack.  X-ray of the lumbar spine was performed 03/31/13. It showed mild anterior slip of L4-5 with associated disc and facet degeneration. Mild disc space narrowing L5-S1. No other abnormality.  Today patient reports that the only time he feels any type of discomfort is for about 15 minutes when he first wakes up in the morning.   He says that during the night when he is sleeping he has no problems with back pain or sciatica symptoms. Even when he initially wakes up in the morning when he is in bed he feels fine. Then he gets out of the bed and walks through the house to get ready for the morning. That is when he feels some achy discomfort down  the backs of both thighs. Says he will go take a half of a Norco and sit in his recliner. Says that within 15 minutes his pain will be resolved. Says that the pain stays gone all day. He has no problem until the same time of day the following morning.  He also has a small area of rash on his left lower calf that he wanted me to see. Says it is itchy but he made sure not to scratch at it.     Home Meds: See attached medication section for any medications that were entered at today's visit. The computer does not put those onto this list.The following list is a list of meds entered prior to today's visit.   Current Outpatient Prescriptions on File Prior to Visit  Medication Sig Dispense Refill  . aspirin EC 81 MG tablet Take 81 mg by mouth daily.        Marland Kitchen atorvastatin (LIPITOR) 80 MG tablet Take 1 tablet (80 mg total) by mouth daily.  90 tablet  0  . AZOR 5-40 MG per tablet TAKE 1 TABLET BY MOUTH DAILY.  30 tablet  3  . carvedilol (COREG) 12.5 MG tablet Take 1 tablet (12.5 mg total) by mouth 2 (two) times daily with a meal.  60 tablet  4  . cyclobenzaprine (FLEXERIL) 10 MG tablet  Take 1 tablet (10 mg total) by mouth 3 (three) times daily as needed for muscle spasms.  30 tablet  0  . ferrous sulfate 325 (65 FE) MG tablet TAKE 1 TABLET BY MOUTH EVERY DAY  30 tablet  5  . predniSONE (DELTASONE) 20 MG tablet Take 3 daily for 2 days, then 2 daily for 2 days, then 1 daily for 2 days.  12 tablet  0  . sildenafil (VIAGRA) 100 MG tablet Take 1 tablet (100 mg total) by mouth daily as needed for erectile dysfunction.  6 tablet  11  . ULORIC 40 MG tablet TAKE 1 TABLET BY MOUTH EVERY DAY  30 tablet  4   No current facility-administered medications on file prior to visit.    Allergies:  Allergies  Allergen Reactions  . Penicillins Swelling      Review of Systems: See HPI for pertinent ROS. All other ROS negative.    Physical Exam: Blood pressure 120/80, pulse 79, temperature 97.9 F (36.6 C),  temperature source Oral, resp. rate 17, weight 174 lb (78.926 kg)., Body mass index is 27.67 kg/(m^2). General: WNWD AAM. Appears in no acute distress. Neck: Supple. No thyromegaly. No lymphadenopathy. Lungs: Clear bilaterally to auscultation without wheezes, rales, or rhonchi. Breathing is unlabored. Heart: Regular rhythm. I/VI murmur 2nd ICS. Abdomen: Soft, non-tender, non-distended with normoactive bowel sounds. No hepatomegaly. No rebound/guarding. No obvious abdominal masses. Msk:  Strength and tone normal for age. There is no pain with palpation of the low back bilaterally. No pain with palpation of the sciatic notches bilaterally. Bilateral straight leg raise is normal. Bilateral hip abduction is normal. 2+ patellar reflexes bilaterally and equal. Extremities/Skin: Left medial calf: Approximate 3/4 inch diameter area of hyperkeratosis and scale. Neuro: Alert and oriented X 3. Moves all extremities spontaneously. Gait is normal. CNII-XII grossly in tact. Psych:  Responds to questions appropriately with a normal affect.     ASSESSMENT AND PLAN:  79 y.o. year old male with  1. Low back pain I reviewed the x-ray findings with the patient. I discussed with him that given how minimal his symptoms are, I see no reason to proceed with MRI or referral to neurosurgeon/orthopedic. Even if an MRI did reveal further abnormalities, I do not think he would even consider doing injections or surgery with this minimal symptoms. Therefore recommend just continued current medical treatment. We are avoiding NSAIDs secondary to his renal function. He is really only taking one half of a Norco daily. However we'll go ahead and prescribe for #90 today he does not have to come back in to get a new prescription.  - HYDROcodone-acetaminophen (NORCO/VICODIN) 5-325 MG per tablet; Take 1 tablet by mouth every 6 (six) hours as needed.  Dispense: 90 tablet; Refill: 0  2. Eczema - triamcinolone cream (KENALOG) 0.5 %;  Apply 1 application topically 3 (three) times daily.  Dispense: 30 g; Refill: 2  He is due for his regular 6 month office visit in December. We'll see him again or see him sooner if needed.   Marin Olp [place], [place], [facility] 08/12/2013 2:59 PM

## 2013-09-14 ENCOUNTER — Telehealth: Payer: Self-pay | Admitting: Physician Assistant

## 2013-09-14 DIAGNOSIS — N529 Male erectile dysfunction, unspecified: Secondary | ICD-10-CM

## 2013-09-14 NOTE — Telephone Encounter (Signed)
Needs Viagra called in to [facility]

## 2013-09-20 NOTE — Telephone Encounter (Signed)
Got #6 in June with 11 refills??

## 2013-09-22 ENCOUNTER — Encounter: Payer: Self-pay | Admitting: Physician Assistant

## 2013-09-22 ENCOUNTER — Ambulatory Visit (INDEPENDENT_AMBULATORY_CARE_PROVIDER_SITE_OTHER): Payer: Medicare Other | Admitting: Physician Assistant

## 2013-09-22 VITALS — BP 122/76 | HR 68 | Temp 97.3°F | Resp 18 | Wt 174.0 lb

## 2013-09-22 DIAGNOSIS — I10 Essential (primary) hypertension: Secondary | ICD-10-CM

## 2013-09-22 DIAGNOSIS — E785 Hyperlipidemia, unspecified: Secondary | ICD-10-CM

## 2013-09-22 DIAGNOSIS — K635 Polyp of colon: Secondary | ICD-10-CM

## 2013-09-22 DIAGNOSIS — N183 Chronic kidney disease, stage 3 unspecified: Secondary | ICD-10-CM

## 2013-09-22 DIAGNOSIS — R7309 Other abnormal glucose: Secondary | ICD-10-CM

## 2013-09-22 DIAGNOSIS — Q600 Renal agenesis, unilateral: Secondary | ICD-10-CM

## 2013-09-22 DIAGNOSIS — Q602 Renal agenesis, unspecified: Secondary | ICD-10-CM

## 2013-09-22 DIAGNOSIS — Z23 Encounter for immunization: Secondary | ICD-10-CM

## 2013-09-22 DIAGNOSIS — R739 Hyperglycemia, unspecified: Secondary | ICD-10-CM

## 2013-09-22 DIAGNOSIS — M109 Gout, unspecified: Secondary | ICD-10-CM

## 2013-09-22 DIAGNOSIS — IMO0002 Reserved for concepts with insufficient information to code with codable children: Secondary | ICD-10-CM

## 2013-09-22 DIAGNOSIS — D126 Benign neoplasm of colon, unspecified: Secondary | ICD-10-CM

## 2013-09-22 DIAGNOSIS — I5022 Chronic systolic (congestive) heart failure: Secondary | ICD-10-CM

## 2013-09-22 DIAGNOSIS — N289 Disorder of kidney and ureter, unspecified: Secondary | ICD-10-CM

## 2013-09-22 LAB — COMPLETE METABOLIC PANEL WITH GFR
ALT: 13 U/L (ref 0–53)
AST: 17 U/L (ref 0–37)
Alkaline Phosphatase: 106 U/L (ref 39–117)
BUN: 17 mg/dL (ref 6–23)
CO2: 27 mEq/L (ref 19–32)
Creat: 1.39 mg/dL — ABNORMAL HIGH (ref 0.50–1.35)
GFR, Est African American: 55 mL/min — ABNORMAL LOW
GFR, Est Non African American: 48 mL/min — ABNORMAL LOW
Total Bilirubin: 0.6 mg/dL (ref 0.3–1.2)

## 2013-09-22 LAB — LIPID PANEL
HDL: 42 mg/dL (ref >39)
LDL Cholesterol: 99 mg/dL (ref 0–99)
Total CHOL/HDL Ratio: 3.7 Ratio
Triglycerides: 76 mg/dL (ref <150)

## 2013-09-22 NOTE — Progress Notes (Signed)
Patient ID: ANDREU PALIN MRN: [id number], DOB: 05-Mar-1932, 79 y.o. Date of Encounter: @DATE @  Chief Complaint:  Chief Complaint  Patient presents with  . 6 mth check up    not fasting    HPI: 79 y.o. year old AA male  presents for routine followup.  He has no complaints today. Says he's been feeling well.  He is taking all blood pressure medications as directed. No adverse effects. No lightheadedness.  He is taking cholesterol medication as directed. No myalgias or other adverse effects.  He reports that he is still having no problems with shortness of breath or lower extremity edema. As well, no type of chest discomfort or dyspnea on exertion.  He says that he gets up at 2:30 in the morning. He is at work at 4 AM.   Past Medical History  Diagnosis Date  . ED (erectile dysfunction)   . BPH (benign prostatic hyperplasia)   . Hyperlipidemia   . Hypertension   . Dyslipidemia   . Arthritis   . Gout   . Heart murmur   . Dysrhythmia 08/19/11    "low heart beat; maybe 25bpm; got RX; now 60bpm"  . Stroke     "mild stroke; dx'd 2010; don't know when it happened"  . Syncope and collapse 08/19/11    "shallow breathing; sweating horribly"  . Chronic kidney disease 05/20/2013    CKD 3  . Solitary kidney Recognized 03/2006    Solitary Functioning Left Kidney  . Nonfunctioning kidney Recognized 03/2006    Chronically and Severely Hydronephrotic and Nonfunctioning Right Kidney  . CHF (congestive heart failure)     Nonischemic Cardiomyopathy. EF 25-35%  . Colon polyps   . Low back pain 08/12/2013     Home Meds: See attached medication section for current medication list. Any medications entered into computer today will not appear on this note's list. The medications listed below were entered prior to today. Current Outpatient Prescriptions on File Prior to Visit  Medication Sig Dispense Refill  . aspirin EC 81 MG tablet Take 81 mg by mouth daily.        Marland Kitchen atorvastatin  (LIPITOR) 80 MG tablet Take 1 tablet (80 mg total) by mouth daily.  90 tablet  0  . AZOR 5-40 MG per tablet TAKE 1 TABLET BY MOUTH DAILY.  30 tablet  3  . carvedilol (COREG) 12.5 MG tablet Take 1 tablet (12.5 mg total) by mouth 2 (two) times daily with a meal.  60 tablet  4  . ferrous sulfate 325 (65 FE) MG tablet TAKE 1 TABLET BY MOUTH EVERY DAY  30 tablet  5  . HYDROcodone-acetaminophen (NORCO/VICODIN) 5-325 MG per tablet Take 1 tablet by mouth every 6 (six) hours as needed.  90 tablet  0  . sildenafil (VIAGRA) 100 MG tablet Take 1 tablet (100 mg total) by mouth daily as needed for erectile dysfunction.  6 tablet  11  . triamcinolone cream (KENALOG) 0.5 % Apply 1 application topically 3 (three) times daily.  30 g  2  . ULORIC 40 MG tablet TAKE 1 TABLET BY MOUTH EVERY DAY  30 tablet  4   No current facility-administered medications on file prior to visit.    Allergies:  Allergies  Allergen Reactions  . Penicillins Swelling    History   Social History  . Marital Status: Married    Spouse Name: N/A    Number of Children: N/A  . Years of Education: N/A  Occupational History  . Not on file.   Social History Main Topics  . Smoking status: Never Smoker   . Smokeless tobacco: Never Used  . Alcohol Use: Yes     Comment: "social drinker; last time 08/15/11"  . Drug Use: No  . Sexual Activity: Yes   Other Topics Concern  . Not on file   Social History Narrative  . No narrative on file    No family history on file.   Review of Systems:  See HPI for pertinent ROS. All other ROS negative.    Physical Exam: Blood pressure 122/76, pulse 68, temperature 97.3 F (36.3 C), temperature source Oral, resp. rate 18, weight 174 lb (78.926 kg)., Body mass index is 27.67 kg/(m^2). General: WNWD AAM. Appears in no acute distress. Neck: Supple. No thyromegaly. No lymphadenopathy. No carotid bruits. Lungs: Clear bilaterally to auscultation without wheezes, rales, or rhonchi. Breathing is  unlabored. Heart: RRR. I/VI murmur right second intercostal space. Abdomen: Soft, non-tender, non-distended with normoactive bowel sounds. No hepatomegaly. No rebound/guarding. No obvious abdominal masses. Musculoskeletal:  Strength and tone normal for age. Extremities/Skin: Warm and dry. No edema.  Neuro: Alert and oriented X 3. Moves all extremities spontaneously. Gait is normal. CNII-XII grossly in tact. Psych:  Responds to questions appropriately with a normal affect.     ASSESSMENT AND PLAN:  79 y.o. year old male with  1. Hyperlipidemia Last lab in June recommended increase Lipitor 40 mg 80 mg. He states that he did make this change and is taking the 80 mg. He did have some coffee with some sugar and cream but this was over 4 hours ago. Otherwise is fasting. - COMPLETE METABOLIC PANEL WITH GFR - Lipid panel  2. Hypertension Blood pressure at goal at 122/76. Continue current medications. Check labs monitor. - COMPLETE METABOLIC PANEL WITH GFR  3. Hyperglycemia So Far, this has remained stable and has not required medications. We'll recheck A1c to monitor. In future, if does require med, NO METFORMIN given CKD - Hemoglobin A1c  4. Chronic kidney disease, stage III (moderate) He sees Dr. Lorrene Reid at [facility] on an annual basis. His last visit with her was May 20, 2013. She did send me a copy of her note and labs. At Lab 03/12/13 here his renal function was actually improved ! - COMPLETE METABOLIC PANEL WITH GFR  5. Solitary kidney  6. Nonfunctioning kidney  7. Chronic systolic heart failure He was found to have low EF at evaluation several years ago. He was evaluated by Dr. Ellouise Newer. However, patient states that he has had no followup with cardiology in greater than one year. Refer him back to Dr. Georgina Peer for routine followup. He probably needs a followup echo. - Ambulatory referral to Cardiology  8. Gout He is on Uloric. He has continued to have no gout  flares in > one year. - COMPLETE METABOLIC PANEL WITH GFR  9. recent visits with back pain and sciatica: Today he says that he is having no problems with his back/sciatica now.  9. Colon polyps: Screening colonoscopy: 10/2009. Repeat 10 years.  10. History of CVA 01/2010  11. Carotid artery ultrasound 07/2011: Right ICA normal. Left ICA 40-59%.  12. Immunizations: Pneumococcal vaccine: Pneumovax was given 11/2011  Give Prevnar 13 now. Tetanus: Had 2007 Influenza vaccine: We'll give now. Patient agreeable. Has not had one this season.  13.Iron Deficiency anemia: He is currently taking iron supplement once a day. He says this causes no constipation or GI adverse effects. On last lab, H./H. were borderline low. MCV was low. I recommended that he increase his iron to twice a day dosing. He is agreeable.   10. Need for prophylactic vaccination and inoculation against influenza - Flu Vaccine QUAD 36+ mos PF IM (Fluarix)  11. Need for prophylactic vaccination against Streptococcus pneumoniae (pneumococcus) - Pneumococcal conjugate vaccine 13-valent   Signed, [address] [place], [place], [facility] 09/22/2013 2:22 PM

## 2013-09-28 ENCOUNTER — Encounter: Payer: Self-pay | Admitting: Family Medicine

## 2013-10-14 ENCOUNTER — Other Ambulatory Visit: Payer: Self-pay | Admitting: Physician Assistant

## 2013-10-14 DIAGNOSIS — I10 Essential (primary) hypertension: Secondary | ICD-10-CM

## 2013-10-14 NOTE — Telephone Encounter (Signed)
Medication refilled per protocol. 

## 2013-10-18 ENCOUNTER — Ambulatory Visit (INDEPENDENT_AMBULATORY_CARE_PROVIDER_SITE_OTHER): Payer: Medicare Other | Admitting: Physician Assistant

## 2013-10-18 ENCOUNTER — Encounter: Payer: Self-pay | Admitting: Physician Assistant

## 2013-10-18 VITALS — BP 164/88 | HR 84 | Temp 99.0°F | Resp 20 | Wt 169.0 lb

## 2013-10-18 DIAGNOSIS — J988 Other specified respiratory disorders: Secondary | ICD-10-CM

## 2013-10-18 DIAGNOSIS — B9689 Other specified bacterial agents as the cause of diseases classified elsewhere: Principal | ICD-10-CM

## 2013-10-18 DIAGNOSIS — A499 Bacterial infection, unspecified: Secondary | ICD-10-CM

## 2013-10-18 MED ORDER — AZITHROMYCIN 250 MG PO TABS: ORAL_TABLET | ORAL | Status: DC

## 2013-10-18 NOTE — Progress Notes (Signed)
Patient ID: John Hines MRN: [id number], DOB: November 09, 1931, 80 y.o. Date of Encounter: 10/18/2013, 12:59 PM    Chief Complaint:  Chief Complaint  Patient presents with  . bad cough, congestion, deep in chest    Short of breath     HPI: 80 y.o. year old [place] male reports he has been sick for 6 days. He stated that he had some nasal congestion at one point but that this had gotten better. However during the visit he did blow his nose and says that it was dark yellow-green thick mucus. Otherwise, he stated that his symptoms have all been chest congestion and cough. States he has had no fever. No sore throat no ear ache.     Home Meds: See attached medication section for any medications that were entered at today's visit. The computer does not put those onto this list.The following list is a list of meds entered prior to today's visit.   Current Outpatient Prescriptions on File Prior to Visit  Medication Sig Dispense Refill  . aspirin EC 81 MG tablet Take 81 mg by mouth daily.        Marland Kitchen atorvastatin (LIPITOR) 80 MG tablet Take 1 tablet (80 mg total) by mouth daily.  90 tablet  0  . AZOR 5-40 MG per tablet TAKE 1 TABLET BY MOUTH DAILY.  30 tablet  5  . carvedilol (COREG) 12.5 MG tablet Take 1 tablet (12.5 mg total) by mouth 2 (two) times daily with a meal.  60 tablet  4  . ferrous sulfate 325 (65 FE) MG tablet TAKE 1 TABLET BY MOUTH EVERY DAY  30 tablet  5  . HYDROcodone-acetaminophen (NORCO/VICODIN) 5-325 MG per tablet Take 1 tablet by mouth every 6 (six) hours as needed.  90 tablet  0  . sildenafil (VIAGRA) 100 MG tablet Take 1 tablet (100 mg total) by mouth daily as needed for erectile dysfunction.  6 tablet  11  . triamcinolone cream (KENALOG) 0.5 % Apply 1 application topically 3 (three) times daily.  30 g  2  . ULORIC 40 MG tablet TAKE 1 TABLET BY MOUTH EVERY DAY  30 tablet  4   No current facility-administered medications on file prior to visit.    Allergies:  Allergies    Allergen Reactions  . Penicillins Swelling      Review of Systems: See HPI for pertinent ROS. All other ROS negative.    Physical Exam: Blood pressure 164/88, pulse 84, temperature 99 F (37.2 C), temperature source Oral, resp. rate 20, weight 169 lb (76.658 kg), SpO2 98.00%., Body mass index is 26.87 kg/(m^2). General: WNWD AAM.  Appears in no acute distress. HEENT: Normocephalic, atraumatic, eyes without discharge, sclera non-icteric, nares are without discharge. Bilateral auditory canals clear, TM's are without perforation, pearly grey and translucent with reflective cone of light bilaterally. Oral cavity moist, posterior pharynx without exudate, erythema, peritonsillar abscess. No tenderness with percussion of sinuses.  Neck: Supple. No thyromegaly. No lymphadenopathy. Lungs: Clear bilaterally to auscultation without wheezes, rales, or rhonchi. Breathing is unlabored. Lungs are clear with good breath sounds throughout. No wheezes rhonchi or rales. Heart: Regular rhythm. No murmurs, rubs, or gallops. Msk:  Strength and tone normal for age. Extremities/Skin: Warm and dry. No clubbing or cyanosis. No edema. No rashes or suspicious lesions. Neuro: Alert and oriented X 3. Moves all extremities spontaneously. Gait is normal. CNII-XII grossly in tact. Psych:  Responds to questions appropriately with a normal affect.  ASSESSMENT AND PLAN:  80 y.o. year old male with  1. Bacterial respiratory infection - azithromycin (ZITHROMAX) 250 MG tablet; Day 1: Take 2 daily.  Days 2-5: Take 1 daily.  Dispense: 6 tablet; Refill: 0  recommend Robitussin-DM or Mucinex DM as expectorant. Told him to followup if symptoms do not resolve within one week after completion of antibiotics. 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 We have given him a note for out of work for these dates.  [address] [place], [place], [facility] 10/18/2013 12:59  PM

## 2013-10-22 ENCOUNTER — Encounter: Payer: Self-pay | Admitting: Cardiovascular Disease

## 2013-10-25 ENCOUNTER — Encounter: Payer: Self-pay | Admitting: Cardiovascular Disease

## 2013-10-25 ENCOUNTER — Ambulatory Visit (INDEPENDENT_AMBULATORY_CARE_PROVIDER_SITE_OTHER): Payer: Medicare Other | Admitting: Cardiovascular Disease

## 2013-10-25 VITALS — BP 110/60 | HR 67 | Ht 67.5 in | Wt 172.1 lb

## 2013-10-25 DIAGNOSIS — N183 Chronic kidney disease, stage 3 unspecified: Secondary | ICD-10-CM

## 2013-10-25 DIAGNOSIS — R739 Hyperglycemia, unspecified: Secondary | ICD-10-CM

## 2013-10-25 DIAGNOSIS — R7309 Other abnormal glucose: Secondary | ICD-10-CM

## 2013-10-25 DIAGNOSIS — I447 Left bundle-branch block, unspecified: Secondary | ICD-10-CM

## 2013-10-25 DIAGNOSIS — I10 Essential (primary) hypertension: Secondary | ICD-10-CM

## 2013-10-25 DIAGNOSIS — I519 Heart disease, unspecified: Secondary | ICD-10-CM

## 2013-10-25 DIAGNOSIS — E785 Hyperlipidemia, unspecified: Secondary | ICD-10-CM

## 2013-10-25 NOTE — Progress Notes (Signed)
Patient ID: John Hines, male   DOB: November 07, 1931, 80 y.o.   MRN: [id number]     HPI: John Hines is a 80 y.o. male who presents to the office for the courtesy of Doreen Beam for followup cardiology evaluation.  John Hines is a 80 year old American male who I have seen in the past and last saw in 2012. He suffered a right cerebellar ischemic infarction in the territory of the right posterior inferior cerebellar artery remotely. He has a history of hypertension as well as hyperlipidemia. In October 2011 his ejection fraction was 25-35% and subsequent echo Doppler assessment in February 2012 showed an ejection fraction in the range of 35-45%. He had grade 2 diastolic dysfunction with left bundle branch block related to asynchrony and also elevation of left atrial pressures by tissue Doppler. He had mild MR as well as mild aortic sclerosis with mild RV dilatation and mild delayed dilation. RV systolic pressure was 27.  He has been on therapy for hypertension. He apparently has a solitary functioning left kidney and a nonfunctioning right kidney. There is also a history of dyslipidemia, arthritis, BPH, erectile dysfunction.  He recently saw Karis Juba for primary care apparatus on it. She had recognized that it had been several years since his last echo Doppler study. He presents now for evaluation. He is unaware of palpitations. John Hines denies recent chest pain. He still remains active working. He denies change in exercise tolerance.  Past Medical History  Diagnosis Date  . ED (erectile dysfunction)   . BPH (benign prostatic hyperplasia)   . Hyperlipidemia   . Hypertension   . Dyslipidemia   . Arthritis   . Gout   . Heart murmur   . Dysrhythmia 08/19/11    "low heart beat; maybe 25bpm; got RX; now 60bpm"  . Stroke     "mild stroke; dx'd 2010; don't know when it happened"  . Syncope and collapse 08/19/11    "shallow breathing; sweating horribly"  . Chronic kidney disease  05/20/2013    CKD 3  . Solitary kidney Recognized 03/2006    Solitary Functioning Left Kidney  . Nonfunctioning kidney Recognized 03/2006    Chronically and Severely Hydronephrotic and Nonfunctioning Right Kidney  . CHF (congestive heart failure)     Nonischemic Cardiomyopathy. EF 25-35%  . Colon polyps   . Low back pain 08/12/2013    Past Surgical History  Procedure Laterality Date  . Knee arthroscopy  1996    right  . Cardiovascular stress test  08/30/2010    Small to moderate fixed inferoseptal defect. Severe global hypokinesis with inferior akinesis and septal dyskinesis-likely secondary to a prominent LBBB. Non-diagnostic for ischemia due to persantine.  . Transthoracic echocardiogram  08/20/2011    EF 45-50%, severe concentric hyperthrophy.    Allergies  Allergen Reactions  . Penicillins Swelling    Current Outpatient Prescriptions  Medication Sig Dispense Refill  . aspirin EC 81 MG tablet Take 81 mg by mouth daily.        Marland Kitchen atorvastatin (LIPITOR) 80 MG tablet Take 1 tablet (80 mg total) by mouth daily.  90 tablet  0  . AZOR 5-40 MG per tablet TAKE 1 TABLET BY MOUTH DAILY.  30 tablet  5  . carvedilol (COREG) 12.5 MG tablet Take 1 tablet (12.5 mg total) by mouth 2 (two) times daily with a meal.  60 tablet  4  . ferrous sulfate 325 (65 FE) MG tablet TAKE 1 TABLET BY MOUTH EVERY  DAY  30 tablet  5  . sildenafil (VIAGRA) 100 MG tablet Take 1 tablet (100 mg total) by mouth daily as needed for erectile dysfunction.  6 tablet  11  . triamcinolone cream (KENALOG) 0.5 % Apply 1 application topically as needed.      Marland Kitchen ULORIC 40 MG tablet TAKE 1 TABLET BY MOUTH EVERY DAY  30 tablet  4   No current facility-administered medications for this visit.    History   Social History  . Marital Status: Married    Spouse Name: N/A    Number of Children: N/A  . Years of Education: N/A   Occupational History  . Not on file.   Social History Main Topics  . Smoking status: Never Smoker     . Smokeless tobacco: Never Used  . Alcohol Use: Yes     Comment: "social drinker; last time 08/15/11"  . Drug Use: No  . Sexual Activity: Yes   Other Topics Concern  . Not on file   Social History Narrative  . No narrative on file   Socially he is married has 2 children 2 grandchildren. 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 There is no tobacco use. Does drink occasional alcohol.  Family History  Problem Relation Age of Onset  . Cancer Father     ROS is negative for fevers, chills or night sweats. He denies change in vision or hearing. He denies skin rash. He denies lymphadenopathy. There is no PND or orthopnea. He denies wheezing. He denies presyncope or syncope. There is no anginal symptoms. He denies abdominal pain nausea vomiting or diarrhea. He is unaware of claudication. There is a history of gout. There is a history of BPH. There also history of erectile dysfunction. He is unaware of blood in stool or urine. He denies claudication symptoms. He denies significant edema. He denies bruxism. There is no diabetes. There is no thyroid abnormalities. He does have hyperlipidemia and seems to tolerate atorvastatin 80 mg. Other comprehensive 14 point system review is negative.  PE BP 110/60  Pulse 67  Ht 5' 7.5" (1.715 m)  Wt 172 lb 1.6 oz (78.064 kg)  BMI 26.54 kg/m2  General: Alert, oriented, no distress.  Skin: normal turgor, no rashes HEENT: Normocephalic, atraumatic. Pupils round and reactive; sclera anicteric;no lid lag. Extraocular muscles intact. No xanthelasmas Nose without nasal septal hypertrophy Mouth/Parynx benign; Mallinpatti scale 2 Neck: No JVD, no carotid bruits; normal carotid upstroke Lungs: clear to ausculatation and percussion; no wheezing or rales Chest wall: no tenderness to palpitation Heart: RRR, s1 s2 normal faint 1/6 systolic murmur, unchanged. No S3 or S4 gallop. No rub. No heaves. Abdomen: soft, nontender; no hepatosplenomehaly,  BS+; abdominal aorta nontender and not dilated by palpation. Back: no CVA tenderness Pulses 2+ Extremities: no clubbing cyanosis or edema, Homan's sign negative  Neurologic: grossly nonfocal; cranial nerves grossly normal. Psychologic: normal affect and mood.  ECG (independently read by me): Sinus rhythm at 67 beats per minute with left bundle-branch block and associated repolarization changes. No ectopy.  LABS:  BMET    Component Value Date/Time   NA 141 09/22/2013 0953   K 4.5 09/22/2013 0953   CL 106 09/22/2013 0953   CO2 27 09/22/2013 0953   GLUCOSE 87 09/22/2013 0953   BUN 17 09/22/2013 0953   CREATININE 1.39* 09/22/2013 0953   CREATININE 2.08* 08/21/2011 0555   CALCIUM 9.2 09/22/2013 0953   GFRNONAA 29* 08/21/2011 0555   GFRAA 34* 08/21/2011 0555  Hepatic Function Panel     Component Value Date/Time   PROT 6.8 09/22/2013 0953   ALBUMIN 4.0 09/22/2013 0953   AST 17 09/22/2013 0953   ALT 13 09/22/2013 0953   ALKPHOS 106 09/22/2013 0953   BILITOT 0.6 09/22/2013 0953     CBC    Component Value Date/Time   WBC 4.8 03/12/2013 0759   WBC 5.8 11/17/2009 1224   RBC 4.84 03/12/2013 0759   RBC 4.73 11/17/2009 1224   HGB 13.0 03/12/2013 0759   HGB 13.5 11/17/2009 1224   HCT 36.9* 03/12/2013 0759   HCT 39.2 11/17/2009 1224   PLT 199 03/12/2013 0759   PLT 223 11/17/2009 1224   MCV 76.2* 03/12/2013 0759   MCV 82.9 11/17/2009 1224   MCH 26.9 03/12/2013 0759   MCH 28.5 11/17/2009 1224   MCHC 35.2 03/12/2013 0759   MCHC 34.4 11/17/2009 1224   RDW 13.5 03/12/2013 0759   RDW 13.3 11/17/2009 1224   LYMPHSABS 1.7 03/12/2013 0759   LYMPHSABS 2.2 11/17/2009 1224   MONOABS 0.5 03/12/2013 0759   MONOABS 0.6 11/17/2009 1224   EOSABS 0.2 03/12/2013 0759   EOSABS 0.2 11/17/2009 1224   BASOSABS 0.0 03/12/2013 0759   BASOSABS 0.0 11/17/2009 1224     BNP    Component Value Date/Time   PROBNP 145.8 08/19/2011 1831    Lipid Panel     Component Value Date/Time   CHOL 156 09/22/2013 0953     TRIG 76 09/22/2013 0953   HDL 42 09/22/2013 0953   CHOLHDL 3.7 09/22/2013 0953   VLDL 15 09/22/2013 0953   LDLCALC 99 09/22/2013 0953     RADIOLOGY: No results found.    ASSESSMENT AND PLAN: My impression is that John Hines is a 80 year old American gentleman who has a history of hypertension and previous documentation of a cart a myopathy. A nuclear study in 2011 show fairly normal perfusion but did reveal probable bowel artifact and possible inferoseptal scar. His last echo Doppler study was in 2012. I did review the office notes Doreen Beam. Resume his heart rhythm is stable. His blood pressure is controlled on combination amlodipine with Benicar in addition to his carvedilol. He is on high-dose lipid-lowering therapy. I have scheduled him for an echo Doppler study to reassess LV systolic and diastolic function and valvular architecture. He is well compensated on his current medical regimen and does not have signs of CHF or volume overload. I did review his recent laboratory from December 2014. I will contact him regarding the echo Doppler study and if adjustments to his medical regimen or necessary I will then see him back for followup evaluation. Otherwise as long as he remains stable I will see him in one year for reassessment or sooner if problems arise.     John Sine, MD, [facility]  10/25/2013 7:22 PM

## 2013-10-25 NOTE — Patient Instructions (Signed)
Your physician has requested that you have an echocardiogram. Echocardiography is a painless test that uses sound waves to create images of your heart. It provides your doctor with information about the size and shape of your heart and how well your heart's chambers and valves are working. This procedure takes approximately one hour. There are no restrictions for this procedure.  Dr Claiborne Billings wants you to follow-up in 1 year. You will receive a reminder letter in the mail two months in advance. If you don't receive a letter, please call our office to schedule the follow-up appointment.

## 2013-10-27 ENCOUNTER — Other Ambulatory Visit: Payer: Self-pay | Admitting: Physician Assistant

## 2013-10-27 NOTE — Telephone Encounter (Signed)
Medication refilled per protocol. 

## 2013-10-28 ENCOUNTER — Ambulatory Visit (HOSPITAL_COMMUNITY)
Admission: RE | Admit: 2013-10-28 | Discharge: 2013-10-28 | Disposition: A | Discharge status: Home / Self Care | Payer: Medicare Other | Source: Ambulatory Visit | Attending: Cardiovascular Disease | Admitting: Cardiovascular Disease

## 2013-10-28 DIAGNOSIS — I10 Essential (primary) hypertension: Secondary | ICD-10-CM | POA: Insufficient documentation

## 2013-10-28 DIAGNOSIS — I509 Heart failure, unspecified: Secondary | ICD-10-CM | POA: Insufficient documentation

## 2013-10-28 DIAGNOSIS — E785 Hyperlipidemia, unspecified: Secondary | ICD-10-CM | POA: Insufficient documentation

## 2013-10-28 DIAGNOSIS — I379 Nonrheumatic pulmonary valve disorder, unspecified: Secondary | ICD-10-CM | POA: Insufficient documentation

## 2013-10-28 DIAGNOSIS — I519 Heart disease, unspecified: Secondary | ICD-10-CM | POA: Insufficient documentation

## 2013-10-28 DIAGNOSIS — N289 Disorder of kidney and ureter, unspecified: Secondary | ICD-10-CM | POA: Insufficient documentation

## 2013-10-28 DIAGNOSIS — I359 Nonrheumatic aortic valve disorder, unspecified: Secondary | ICD-10-CM

## 2013-10-28 DIAGNOSIS — I447 Left bundle-branch block, unspecified: Secondary | ICD-10-CM | POA: Insufficient documentation

## 2013-10-28 DIAGNOSIS — I080 Rheumatic disorders of both mitral and aortic valves: Secondary | ICD-10-CM | POA: Insufficient documentation

## 2013-10-28 DIAGNOSIS — R55 Syncope and collapse: Secondary | ICD-10-CM | POA: Insufficient documentation

## 2013-10-28 DIAGNOSIS — I079 Rheumatic tricuspid valve disease, unspecified: Secondary | ICD-10-CM | POA: Insufficient documentation

## 2013-10-28 NOTE — Progress Notes (Signed)
2D Echo Performed 10/28/2013    Nabor Thomann, RCS  

## 2013-11-02 ENCOUNTER — Telehealth: Payer: Self-pay | Admitting: Family Medicine

## 2013-11-02 ENCOUNTER — Other Ambulatory Visit: Payer: Self-pay | Admitting: Physician Assistant

## 2013-11-02 DIAGNOSIS — M109 Gout, unspecified: Secondary | ICD-10-CM

## 2013-11-02 MED ORDER — FEBUXOSTAT 40 MG PO TABS: 40.0000 mg | ORAL_TABLET | Freq: Every day | ORAL | Status: DC

## 2013-11-02 NOTE — Telephone Encounter (Signed)
Medication refilled per protocol. 

## 2014-01-15 ENCOUNTER — Other Ambulatory Visit: Payer: Self-pay | Admitting: Physician Assistant

## 2014-01-17 NOTE — Telephone Encounter (Signed)
Refill appropriate and filled per protocol. 

## 2014-02-16 ENCOUNTER — Other Ambulatory Visit: Payer: Self-pay | Admitting: Family Medicine

## 2014-03-02 ENCOUNTER — Ambulatory Visit (INDEPENDENT_AMBULATORY_CARE_PROVIDER_SITE_OTHER): Payer: Medicare Other | Admitting: Physician Assistant

## 2014-03-02 ENCOUNTER — Ambulatory Visit
Admission: RE | Admit: 2014-03-02 | Discharge: 2014-03-02 | Disposition: A | Discharge status: Home / Self Care | Payer: Medicare Other | Source: Ambulatory Visit | Attending: Physician Assistant | Admitting: Physician Assistant

## 2014-03-02 ENCOUNTER — Encounter: Payer: Self-pay | Admitting: Physician Assistant

## 2014-03-02 VITALS — BP 110/74 | HR 64 | Temp 97.7°F | Resp 18 | Wt 164.0 lb

## 2014-03-02 DIAGNOSIS — M545 Low back pain, unspecified: Secondary | ICD-10-CM

## 2014-03-02 LAB — URINALYSIS, ROUTINE W REFLEX MICROSCOPIC
BILIRUBIN URINE: NEGATIVE
GLUCOSE, UA: NEGATIVE mg/dL
KETONES UR: NEGATIVE mg/dL
Leukocytes, UA: NEGATIVE
Nitrite: NEGATIVE
PH: 6 (ref 5.0–8.0)
Protein, ur: NEGATIVE mg/dL
Specific Gravity, Urine: 1.025 (ref 1.005–1.030)
UROBILINOGEN UA: 0.2 mg/dL (ref 0.0–1.0)

## 2014-03-02 LAB — URINALYSIS, MICROSCOPIC ONLY
CASTS: NONE SEEN
CRYSTALS: NONE SEEN
Squamous Epithelial / LPF: NONE SEEN

## 2014-03-02 MED ORDER — CYCLOBENZAPRINE HCL 10 MG PO TABS: 10.0000 mg | ORAL_TABLET | Freq: Three times a day (TID) | ORAL | Status: DC | PRN

## 2014-03-02 MED ORDER — HYDROCODONE-ACETAMINOPHEN 5-325 MG PO TABS: 1.0000 | ORAL_TABLET | ORAL | Status: DC | PRN

## 2014-03-02 NOTE — Progress Notes (Signed)
Patient ID: John Hines MRN: [id number], DOB: 03-16-1932, 80 y.o. Date of Encounter: @DATE @  Chief Complaint:  Chief Complaint  Patient presents with  . c/o left flank pain x 2 weeks    HPI: 80 y.o. year old AA male  presents with pain in the left low back for the past 2 weeks. Says " in the beginning it wasn't so bad".  Says this past Sunday 02/27/14 while at work it "got real bad."  Today he went back to work but it got worse while working and bending and moving. Tuesday, which was yesterday, stayed home from work. He says that there is some mild discomfort at the site (left low back) all the time but whenever he moves a lot it gets worse. 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 Says they have been getting ready for inspectors to come in and he has been working 10-12 hours per day instead of his usual 6 hours per day. When he is at work he is working on equipment and doing a lot of bending and moving to the side and then back --back and forth from the side.    Today I reviewed with him what I have documented in my note from 03/31/13. At that visit he reported that that prior Thursday, 03/25/2013 he developed pain in the left low back. Reported having a constant minor pain in the left low back. The pain would get worse with movement. AFTER I reviewed the above him, he says, "Oh, yeah,  this pain does feel like it did back then".  Visit I prescribed Flexeril hydrocodone and prednisone. Also obtained x-ray. 2 to avoid NSAIDs given his kidney disease.  X-ray showed mild anterior slip of L4-5 with associated disc and facet degeneration. Remaining alignment normal. Negative for fracture or pars defect. No mass lesion. Mild  disc space narrowing at L5 - S1.  He has had no pain, numbness, tingling down either leg.  Past Medical History  Diagnosis Date  . ED (erectile dysfunction)   . BPH (benign prostatic hyperplasia)   . Hyperlipidemia   . Hypertension   . Dyslipidemia   .  Arthritis   . Gout   . Heart murmur   . Dysrhythmia 08/19/11    "low heart beat; maybe 25bpm; got RX; now 60bpm"  . Stroke     "mild stroke; dx'd 2010; don't know when it happened"  . Syncope and collapse 08/19/11    "shallow breathing; sweating horribly"  . Chronic kidney disease 05/20/2013    CKD 3  . Solitary kidney Recognized 03/2006    Solitary Functioning Left Kidney  . Nonfunctioning kidney Recognized 03/2006    Chronically and Severely Hydronephrotic and Nonfunctioning Right Kidney  . CHF (congestive heart failure)     Nonischemic Cardiomyopathy. EF 25-35%  . Colon polyps   . Low back pain 08/12/2013     Home Meds: Outpatient Prescriptions Prior to Visit  Medication Sig Dispense Refill  . aspirin EC 81 MG tablet Take 81 mg by mouth daily.        Marland Kitchen atorvastatin (LIPITOR) 80 MG tablet TAKE 1 TABLET (80 MG TOTAL) BY MOUTH DAILY.  90 tablet  1  . AZOR 5-40 MG per tablet TAKE 1 TABLET BY MOUTH DAILY.  30 tablet  5  . carvedilol (COREG) 12.5 MG tablet TAKE 1 TABLET (12.5 MG TOTAL) BY MOUTH 2 (TWO) TIMES DAILY WITH A MEAL.  60 tablet  4  . febuxostat (ULORIC) 40 MG tablet  Take 1 tablet (40 mg total) by mouth daily.  30 tablet  5  . ferrous sulfate 325 (65 FE) MG tablet TAKE 1 TABLET BY MOUTH EVERY DAY  30 tablet  5  . sildenafil (VIAGRA) 100 MG tablet Take 1 tablet (100 mg total) by mouth daily as needed for erectile dysfunction.  6 tablet  11  . triamcinolone cream (KENALOG) 0.5 % Apply 1 application topically as needed.       No facility-administered medications prior to visit.    Allergies:  Allergies  Allergen Reactions  . Penicillins Swelling    History   Social History  . Marital Status: Married    Spouse Name: John Hines    Number of Children: John Hines  . Years of Education: John Hines   Occupational History  . Not on file.   Social History Main Topics  . Smoking status: Never Smoker   . Smokeless tobacco: Never Used  . Alcohol Use: Yes     Comment: "social drinker; last  time 08/15/11"  . Drug Use: No  . Sexual Activity: Yes   Other Topics Concern  . Not on file   Social History Narrative  . No narrative on file    Family History  Problem Relation Age of Onset  . Cancer Father      Review of Systems:  See HPI for pertinent ROS. All other ROS negative.    Physical Exam: Blood pressure 110/74, pulse 64, temperature 97.7 F (36.5 C), temperature source Oral, resp. rate 18, weight 164 lb (74.39 kg)., Body mass index is 25.29 kg/(m^2). General: WNWD AAM. Appears in no acute distress. Neck: Supple. No thyromegaly. No lymphadenopathy. Lungs: Clear bilaterally to auscultation without wheezes, rales, or rhonchi. Breathing is unlabored. Heart: RRR with S1 S2. No murmurs, rubs, or gallops. Musculoskeletal:  Strength and tone normal for age. He points to  left back at approximate T11 area as the area of discomfort. However he says even with deep palpation he cannot reproduce the discomfort. Neuro: Alert and oriented X 3. Moves all extremities spontaneously. Gait is normal. CNII-XII grossly in tact. Psych:  Responds to questions appropriately with a normal affect.   Results for orders placed in visit on 03/02/14  URINALYSIS, ROUTINE W REFLEX MICROSCOPIC      Result Value Ref Range   Color, Urine YELLOW  YELLOW   APPearance CLEAR  CLEAR   Specific Gravity, Urine 1.025  1.005 - 1.030   pH 6.0  5.0 - 8.0   Glucose, UA NEG  NEG mg/dL   Bilirubin Urine NEG  NEG   Ketones, ur NEG  NEG mg/dL   Hgb urine dipstick TRACE (*) NEG   Protein, ur NEG  NEG mg/dL   Urobilinogen, UA 0.2  0.0 - 1.0 mg/dL   Nitrite NEG  NEG   Leukocytes, UA NEG  NEG  URINALYSIS, MICROSCOPIC ONLY      Result Value Ref Range   Squamous Epithelial / LPF NONE SEEN  RARE   Crystals NONE SEEN  NONE SEEN   Casts NONE SEEN  NONE SEEN   WBC, UA 0-2  <3 WBC/hpf   RBC / HPF 0-2  <3 RBC/hpf   Bacteria, UA FEW (*) RARE     ASSESSMENT AND PLAN:  80 y.o. year old male with  1. Left low  back pain - cyclobenzaprine (FLEXERIL) 10 MG tablet; Take 1 tablet (10 mg total) by mouth 3 (three) times daily as needed for muscle spasms.  Dispense: 30 tablet; Refill:  2 - HYDROcodone-acetaminophen (NORCO/VICODIN) 5-325 MG per tablet; Take 1 tablet by mouth every 4 (four) hours as needed.  Dispense: 60 tablet; Refill: 0 - DG Thoracic Spine W/Swimmers; Future  2. Low back pain. - Urinalysis, Routine w reflex microscopic  His pain is c/w  Musculoskeletal pain. Will obtain a thoracic spine x-ray as his area of pain may actually be more involved in the thoracic area rather than at the lumbar. Also will go ahead and give him refills on his Flexeril and hydrocodone so he will have these available to use. He says that he is currently out of both of them. Give him a note to be out of work for yesterday, today, and tomorrow. I will Follow up with him once again the x-ray results.   Marin Olp Greene, [place], [facility] 03/02/2014 4:24 PM

## 2014-03-03 ENCOUNTER — Ambulatory Visit: Payer: Medicare Other | Admitting: Physician Assistant

## 2014-03-17 ENCOUNTER — Ambulatory Visit: Payer: Medicare Other | Admitting: Physician Assistant

## 2014-04-26 ENCOUNTER — Other Ambulatory Visit: Payer: Self-pay | Admitting: Physician Assistant

## 2014-04-26 NOTE — Telephone Encounter (Signed)
Refill appropriate and filled per protocol. 

## 2014-05-06 ENCOUNTER — Encounter: Payer: Self-pay | Admitting: Gastroenterology

## 2014-05-15 ENCOUNTER — Other Ambulatory Visit: Payer: Self-pay | Admitting: Physician Assistant

## 2014-06-08 ENCOUNTER — Other Ambulatory Visit: Payer: Self-pay | Admitting: Physician Assistant

## 2014-06-08 DIAGNOSIS — R739 Hyperglycemia, unspecified: Secondary | ICD-10-CM

## 2014-06-08 DIAGNOSIS — I509 Heart failure, unspecified: Secondary | ICD-10-CM

## 2014-06-08 DIAGNOSIS — E785 Hyperlipidemia, unspecified: Secondary | ICD-10-CM

## 2014-06-08 DIAGNOSIS — N183 Chronic kidney disease, stage 3 (moderate): Secondary | ICD-10-CM

## 2014-06-08 DIAGNOSIS — I10 Essential (primary) hypertension: Secondary | ICD-10-CM

## 2014-06-09 ENCOUNTER — Other Ambulatory Visit: Payer: Medicare Other

## 2014-06-09 DIAGNOSIS — E785 Hyperlipidemia, unspecified: Secondary | ICD-10-CM

## 2014-06-09 DIAGNOSIS — I10 Essential (primary) hypertension: Secondary | ICD-10-CM

## 2014-06-09 DIAGNOSIS — I509 Heart failure, unspecified: Secondary | ICD-10-CM

## 2014-06-09 DIAGNOSIS — R739 Hyperglycemia, unspecified: Secondary | ICD-10-CM

## 2014-06-09 DIAGNOSIS — N183 Chronic kidney disease, stage 3 (moderate): Secondary | ICD-10-CM

## 2014-06-09 LAB — LIPID PANEL
CHOLESTEROL: 138 mg/dL (ref 0–200)
HDL: 52 mg/dL (ref >39)
LDL Cholesterol: 76 mg/dL (ref 0–99)
TRIGLYCERIDES: 49 mg/dL (ref <150)
Total CHOL/HDL Ratio: 2.7 Ratio
VLDL: 10 mg/dL (ref 0–40)

## 2014-06-09 LAB — COMPLETE METABOLIC PANEL WITH GFR
ALBUMIN: 3.8 g/dL (ref 3.5–5.2)
ALT: 15 U/L (ref 0–53)
AST: 15 U/L (ref 0–37)
Alkaline Phosphatase: 94 U/L (ref 39–117)
BUN: 14 mg/dL (ref 6–23)
CALCIUM: 9 mg/dL (ref 8.4–10.5)
CO2: 27 meq/L (ref 19–32)
CREATININE: 1.26 mg/dL (ref 0.50–1.35)
Chloride: 106 mEq/L (ref 96–112)
GFR, EST AFRICAN AMERICAN: 62 mL/min
GFR, Est Non African American: 54 mL/min — ABNORMAL LOW
Glucose, Bld: 98 mg/dL (ref 70–99)
POTASSIUM: 4.3 meq/L (ref 3.5–5.3)
Sodium: 141 mEq/L (ref 135–145)
Total Bilirubin: 0.7 mg/dL (ref 0.2–1.2)
Total Protein: 6.5 g/dL (ref 6.0–8.3)

## 2014-06-09 LAB — CBC WITH DIFFERENTIAL/PLATELET
Basophils Absolute: 0 10*3/uL (ref 0.0–0.1)
Basophils Relative: 0 % (ref 0–1)
Eosinophils Absolute: 0.2 10*3/uL (ref 0.0–0.7)
Eosinophils Relative: 4 % (ref 0–5)
HEMATOCRIT: 36.5 % — AB (ref 39.0–52.0)
HEMOGLOBIN: 12.8 g/dL — AB (ref 13.0–17.0)
LYMPHS ABS: 2 10*3/uL (ref 0.7–4.0)
Lymphocytes Relative: 42 % (ref 12–46)
MCH: 27.4 pg (ref 26.0–34.0)
MCHC: 35.1 g/dL (ref 30.0–36.0)
MCV: 78.2 fL (ref 78.0–100.0)
Monocytes Absolute: 0.4 10*3/uL (ref 0.1–1.0)
Monocytes Relative: 9 % (ref 3–12)
NEUTROS PCT: 45 % (ref 43–77)
Neutro Abs: 2.1 10*3/uL (ref 1.7–7.7)
Platelets: 217 10*3/uL (ref 150–400)
RBC: 4.67 MIL/uL (ref 4.22–5.81)
RDW: 13.9 % (ref 11.5–15.5)
WBC: 4.7 10*3/uL (ref 4.0–10.5)

## 2014-06-09 LAB — HEMOGLOBIN A1C
HEMOGLOBIN A1C: 6.6 % — AB (ref <5.7)
Mean Plasma Glucose: 143 mg/dL — ABNORMAL HIGH (ref <117)

## 2014-06-16 ENCOUNTER — Ambulatory Visit (INDEPENDENT_AMBULATORY_CARE_PROVIDER_SITE_OTHER): Payer: Medicare Other | Admitting: Physician Assistant

## 2014-06-16 ENCOUNTER — Encounter: Payer: Self-pay | Admitting: Physician Assistant

## 2014-06-16 VITALS — BP 120/74 | HR 80 | Temp 98.3°F | Resp 18 | Ht 66.0 in | Wt 168.0 lb

## 2014-06-16 DIAGNOSIS — Z23 Encounter for immunization: Secondary | ICD-10-CM

## 2014-06-16 DIAGNOSIS — Z0000 Encounter for general adult medical examination without abnormal findings: Secondary | ICD-10-CM

## 2014-06-17 ENCOUNTER — Encounter: Payer: Self-pay | Admitting: Physician Assistant

## 2014-06-17 NOTE — Progress Notes (Signed)
Subjective:   Patient presents for Medicare Annual/Subsequent preventive examination.   Review Past Medical/Family/Social: All of this information has been reviewed today and as documented below.   Risk Factors  Current exercise habits: He is extremely active. He continues to work part-time and walks daily. Dietary issues discussed: He is very compliant with low cholesterol low sugar diet.  Cardiac risk factors: Hypertension, hyperlipidemia, history of CVA. Male. Advanced age.   Depression Screen  (Note: if answer to either of the following is "Yes", a more complete depression screening is indicated)  Over the past two weeks, have you felt down, depressed or hopeless? No Over the past two weeks, have you felt little interest or pleasure in doing things? Sometimes Have you lost interest or pleasure in daily life? No Do you often feel hopeless? No Do you cry easily over simple problems? No   Activities of Daily Living  In your present state of health, do you have any difficulty performing the following activities?:  Driving? No  Managing money? No  Feeding yourself? No  Getting from bed to chair? No  Climbing a flight of stairs? No  Preparing food and eating?: No  Bathing or showering? No  Getting dressed: No  Getting to the toilet? No  Using the toilet:No  Moving around from place to place: No  In the past year have you fallen or had a near fall?:No  Are you sexually active? No  Do you have more than one partner? No   Hearing Difficulties: No  Do you often ask people to speak up or repeat themselves? No  Do you experience ringing or noises in your ears? No Do you have difficulty understanding soft or whispered voices? No  Do you feel that you have a problem with memory? No Do you often misplace items? No  Do you feel safe at home? Yes  Cognitive Testing  Alert? Yes Normal Appearance?Yes  Oriented to person? Yes Place? Yes  Time? Yes  Recall of three objects? Yes  Can  perform simple calculations? Yes  Displays appropriate judgment?Yes  Can read the correct time from a watch face?Yes   List the Names of Other Physician/Practitioners you currently use:  Dr. Shelva Majestic cardiology Dr. Jamal Maes at [facility]  Indicate any recent Medical Services you may have received from other than [facility] providers in the past year (date may be approximate).   Screening Tests / Date Colonoscopy--- 10/2009. Repeat 10 years.                     Zostavax  Mammogram N/A Influenza Vaccine he is agreeable to receive influenza vaccine today. Tetanus/tdap--would not discuss updating this today as it is not covered by his insurance.     Assessment:    Annual wellness medicare exam   Plan:    During the course of the visit the patient was educated and counseled about appropriate screening and preventive services including:  Screening mammography  Colorectal cancer screening  Shingles vaccine. Prescription given to that she can get the vaccine at the pharmacy or Medicare part D.  Screen + for depression. PHQ- 9 score of 12 (moderate depression). We discussed the options of counseling versus possibly a medication. I encouraged her strongly think about the counseling. She is going through some medical problems currently and her husband is as well Mrs. been very stressful for her. She says she will think about it. She does have Xanax to use as needed. Though  she may benefit from an SSRI for her more depressive type symptoms but she wants to hold off at this time.  I aksed her to please have her cardioloist send records since we have none on file.  Diet review for nutrition referral? Yes ____ Not Indicated __x__  Patient Instructions (the written plan) was given to the patient.  Medicare Attestation  I have personally reviewed:  The patient's medical and social history  Their use of alcohol, tobacco or illicit drugs  Their current medications and supplements   The patient's functional ability including ADLs,fall risks, home safety risks, cognitive, and hearing and visual impairment  Diet and physical activities  Evidence for depression or mood disorders  The patient's weight, height, BMI, and visual acuity have been recorded in the chart. I have made referrals, counseling, and provided education to the patient based on review of the above and I have provided the patient with a written personalized care plan for preventive services.     Past Medical History  Diagnosis Date  . ED (erectile dysfunction)   . BPH (benign prostatic hyperplasia)   . Hyperlipidemia   . Hypertension   . Dyslipidemia   . Arthritis   . Gout   . Heart murmur   . Dysrhythmia 08/19/11    "low heart beat; maybe 25bpm; got RX; now 60bpm"  . Stroke     "mild stroke; dx'd 2010; don't know when it happened"  . Syncope and collapse 08/19/11    "shallow breathing; sweating horribly"  . Chronic kidney disease 05/20/2013    CKD 3  . Solitary kidney Recognized 03/2006    Solitary Functioning Left Kidney  . Nonfunctioning kidney Recognized 03/2006    Chronically and Severely Hydronephrotic and Nonfunctioning Right Kidney  . CHF (congestive heart failure)     Nonischemic Cardiomyopathy. EF 25-35%  . Colon polyps   . Low back pain 08/12/2013     Past Surgical History  Procedure Laterality Date  . Knee arthroscopy  1996    right  . Cardiovascular stress test  08/30/2010    Small to moderate fixed inferoseptal defect. Severe global hypokinesis with inferior akinesis and septal dyskinesis-likely secondary to a prominent LBBB. Non-diagnostic for ischemia due to persantine.  . Transthoracic echocardiogram  08/20/2011    EF 45-50%, severe concentric hyperthrophy.    Home Meds:  Outpatient Prescriptions Prior to Visit  Medication Sig Dispense Refill  . aspirin EC 81 MG tablet Take 81 mg by mouth daily.        Marland Kitchen atorvastatin (LIPITOR) 80 MG tablet TAKE 1 TABLET (80 MG TOTAL)  BY MOUTH DAILY.  90 tablet  1  . AZOR 5-40 MG per tablet TAKE 1 TABLET BY MOUTH EVERY DAY  30 tablet  6  . carvedilol (COREG) 12.5 MG tablet TAKE 1 TABLET (12.5 MG TOTAL) BY MOUTH 2 (TWO) TIMES DAILY WITH A MEAL.  60 tablet  4  . cyclobenzaprine (FLEXERIL) 10 MG tablet Take 1 tablet (10 mg total) by mouth 3 (three) times daily as needed for muscle spasms.  30 tablet  2  . ferrous sulfate 325 (65 FE) MG tablet TAKE 1 TABLET BY MOUTH EVERY DAY  30 tablet  5  . HYDROcodone-acetaminophen (NORCO/VICODIN) 5-325 MG per tablet Take 1 tablet by mouth every 4 (four) hours as needed.  60 tablet  0  . sildenafil (VIAGRA) 100 MG tablet Take 1 tablet (100 mg total) by mouth daily as needed for erectile dysfunction.  6 tablet  11  .  ULORIC 40 MG tablet TAKE 1 TABLET BY MOUTH EVERY DAY  30 tablet  6  . triamcinolone cream (KENALOG) 0.5 % Apply 1 application topically as needed.       No facility-administered medications prior to visit.    Allergies:  Allergies  Allergen Reactions  . Penicillins Swelling    History   Social History  . Marital Status: Married    Spouse Name: N/A    Number of Children: N/A  . Years of Education: N/A   Occupational History  . Not on file.   Social History Main Topics  . Smoking status: Never Smoker   . Smokeless tobacco: Never Used  . Alcohol Use: Yes     Comment: "social drinker; last time 08/15/11"  . Drug Use: No  . Sexual Activity: Yes   Other Topics Concern  . Not on file   Social History Narrative  . No narrative on file    Family History  Problem Relation Age of Onset  . Cancer Father     Physical Exam: Blood pressure 120/74, pulse 80, temperature 98.3 F (36.8 C), temperature source Oral, resp. rate 18, height '5\' 6"'  (1.676 m), weight 168 lb (76.204 kg).  General: Well developed, well nourished,AAM. Appears much younger than stated age. Appears in no acute distress. HEENT: Normocephalic, atraumatic. Conjunctiva pink, sclera non-icteric.  Pupils 2 mm constricting to 1 mm, round, regular, and equally reactive to light and accomodation. EOMI. Internal auditory canal clear. TMs with good cone of light and without pathology. Nasal mucosa pink. Nares are without discharge. No sinus tenderness. Oral mucosa pink. Pharynx without exudate.   Neck: Supple. Trachea midline. No thyromegaly. Full ROM. No lymphadenopathy. Lungs: Clear to auscultation bilaterally without wheezes, rales, or rhonchi. Breathing is of normal effort and unlabored. Cardiovascular: RRR with S1 S2. No murmurs, rubs, or gallops. Distal pulses 2+ symmetrically. No carotid or abdominal bruits. Abdomen: Soft, non-tender, non-distended with normoactive bowel sounds. No hepatosplenomegaly or masses. No rebound/guarding. No CVA tenderness. No hernias. Rectal:Deferred. Pt 80 y/o. Musculoskeletal: Full range of motion and 5/5 strength throughout. Without swelling, atrophy, tenderness, or warmth. Extremities without clubbing, cyanosis, or edema.  Skin: Warm and moist without erythema, ecchymosis, wounds, or rash. Neuro: A+Ox3. CN II-XII grossly intact. Moves all extremities spontaneously. Full sensation throughout. Normal gait. Psych:  Responds to questions appropriately with a normal affect.   Assessment/Plan:  80 y.o. y/o  male here for CPE -1. Visit for preventive health examination  A. Screening Labs: Results for orders placed in visit on 06/09/14  LIPID PANEL      Result Value Ref Range   Cholesterol 138  0 - 200 mg/dL   Triglycerides 49  <150 mg/dL   HDL 52  >39 mg/dL   Total CHOL/HDL Ratio 2.7     VLDL 10  0 - 40 mg/dL   LDL Cholesterol 76  0 - 99 mg/dL  CBC WITH DIFFERENTIAL      Result Value Ref Range   WBC 4.7  4.0 - 10.5 K/uL   RBC 4.67  4.22 - 5.81 MIL/uL   Hemoglobin 12.8 (*) 13.0 - 17.0 g/dL   HCT 36.5 (*) 39.0 - 52.0 %   MCV 78.2  78.0 - 100.0 fL   MCH 27.4  26.0 - 34.0 pg   MCHC 35.1  30.0 - 36.0 g/dL   RDW 13.9  11.5 - 15.5 %   Platelets 217  150 -  400 K/uL   Neutrophils Relative % 45  43 - 77 %   Neutro Abs 2.1  1.7 - 7.7 K/uL   Lymphocytes Relative 42  12 - 46 %   Lymphs Abs 2.0  0.7 - 4.0 K/uL   Monocytes Relative 9  3 - 12 %   Monocytes Absolute 0.4  0.1 - 1.0 K/uL   Eosinophils Relative 4  0 - 5 %   Eosinophils Absolute 0.2  0.0 - 0.7 K/uL   Basophils Relative 0  0 - 1 %   Basophils Absolute 0.0  0.0 - 0.1 K/uL   Smear Review Criteria for review not met    HEMOGLOBIN A1C      Result Value Ref Range   Hemoglobin A1C 6.6 (*) <5.7 %   Mean Plasma Glucose 143 (*) <117 mg/dL  COMPLETE METABOLIC PANEL WITH GFR      Result Value Ref Range   Sodium 141  135 - 145 mEq/L   Potassium 4.3  3.5 - 5.3 mEq/L   Chloride 106  96 - 112 mEq/L   CO2 27  19 - 32 mEq/L   Glucose, Bld 98  70 - 99 mg/dL   BUN 14  6 - 23 mg/dL   Creat 1.26  0.50 - 1.35 mg/dL   Total Bilirubin 0.7  0.2 - 1.2 mg/dL   Alkaline Phosphatase 94  39 - 117 U/L   AST 15  0 - 37 U/L   ALT 15  0 - 53 U/L   Total Protein 6.5  6.0 - 8.3 g/dL   Albumin 3.8  3.5 - 5.2 g/dL   Calcium 9.0  8.4 - 10.5 mg/dL   GFR, Est African American 62     GFR, Est Non African American 54 (*)      B. Screening For Prostate Cancer: N/A secondary to age 25 y/o.  C. Screening For Colorectal Cancer: Last  Colonoscopy 10/2009. Repeat 10 years.  10. History of CVA 01/2010  11. Carotid artery ultrasound 07/2011: Right ICA normal. Left ICA 40-59%.  12. Immunizations:  Pneumococcal vaccine: Pneumovax was given 11/2011 .           Prevnar 13 given 09/22/2013  Tetanus: Had 2007  Influenza vaccine: We'll give now. Patient agreeable. Has not had one this season.   2. Need for prophylactic vaccination and inoculation against influenza - Flu Vaccine QUAD 36+ mos PF IM (Fluarix Quad PF)  He reports that he saw ophthalmologist 3 months ago. Had glaucoma screen at that time.   Signed:   [address] [place], [place]  06/17/2014 7:53 PM

## 2014-09-22 ENCOUNTER — Other Ambulatory Visit: Payer: Self-pay | Admitting: Family Medicine

## 2014-09-22 NOTE — Telephone Encounter (Signed)
Medication refilled per protocol. 

## 2014-09-27 ENCOUNTER — Encounter: Payer: Self-pay | Admitting: Family Medicine

## 2014-09-27 ENCOUNTER — Ambulatory Visit (INDEPENDENT_AMBULATORY_CARE_PROVIDER_SITE_OTHER): Payer: 59 | Admitting: Family Medicine

## 2014-09-27 VITALS — BP 138/80 | HR 98 | Temp 98.3°F | Resp 18 | Wt 169.0 lb

## 2014-09-27 DIAGNOSIS — H10022 Other mucopurulent conjunctivitis, left eye: Secondary | ICD-10-CM

## 2014-09-27 DIAGNOSIS — J069 Acute upper respiratory infection, unspecified: Secondary | ICD-10-CM

## 2014-09-27 MED ORDER — POLYMYXIN B-TRIMETHOPRIM 10000-0.1 UNIT/ML-% OP SOLN: 2.0000 [drp] | OPHTHALMIC | Status: DC

## 2014-09-27 MED ORDER — SILDENAFIL CITRATE 20 MG PO TABS: ORAL_TABLET | ORAL | Status: DC

## 2014-09-27 MED ORDER — AZITHROMYCIN 250 MG PO TABS: ORAL_TABLET | ORAL | Status: DC

## 2014-09-27 NOTE — Progress Notes (Signed)
Subjective:    Patient ID: John Hines, male    DOB: 1932-06-13, 81 y.o.   MRN: [id number]  HPI Patient is a very pleasant 81 year old African-American male who presents today with 5 days of pinkeye in his left eye. He reports purulent drainage from his eye every morning. The highest 4 inches. He denies any vision changes. He is also had a dry cough for approximately 1 week. Initially was productive of yellow sputum. The sputum has since become clear and is starting to improve.  He denies any fevers chills chest pain or shortness of breath. He denies any hemoptysis. Past Medical History  Diagnosis Date  . ED (erectile dysfunction)   . BPH (benign prostatic hyperplasia)   . Hyperlipidemia   . Hypertension   . Dyslipidemia   . Arthritis   . Gout   . Heart murmur   . Dysrhythmia 08/19/11    "low heart beat; maybe 25bpm; got RX; now 60bpm"  . Stroke     "mild stroke; dx'd 2010; don't know when it happened"  . Syncope and collapse 08/19/11    "shallow breathing; sweating horribly"  . Chronic kidney disease 05/20/2013    CKD 3  . Solitary kidney Recognized 03/2006    Solitary Functioning Left Kidney  . Nonfunctioning kidney Recognized 03/2006    Chronically and Severely Hydronephrotic and Nonfunctioning Right Kidney  . CHF (congestive heart failure)     Nonischemic Cardiomyopathy. EF 25-35%  . Colon polyps   . Low back pain 08/12/2013   Past Surgical History  Procedure Laterality Date  . Knee arthroscopy  1996    right  . Cardiovascular stress test  08/30/2010    Small to moderate fixed inferoseptal defect. Severe global hypokinesis with inferior akinesis and septal dyskinesis-likely secondary to a prominent LBBB. Non-diagnostic for ischemia due to persantine.  . Transthoracic echocardiogram  08/20/2011    EF 45-50%, severe concentric hyperthrophy.   Current Outpatient Prescriptions on File Prior to Visit  Medication Sig Dispense Refill  . aspirin EC 81 MG tablet Take 81 mg  by mouth daily.      Marland Kitchen atorvastatin (LIPITOR) 80 MG tablet TAKE 1 TABLET (80 MG TOTAL) BY MOUTH DAILY. 90 tablet 1  . AZOR 5-40 MG per tablet TAKE 1 TABLET BY MOUTH EVERY DAY 30 tablet 6  . carvedilol (COREG) 12.5 MG tablet TAKE 1 TABLET (12.5 MG TOTAL) BY MOUTH 2 (TWO) TIMES DAILY WITH A MEAL. 60 tablet 4  . ferrous sulfate 325 (65 FE) MG tablet TAKE 1 TABLET BY MOUTH EVERY DAY 30 tablet 5  . sildenafil (VIAGRA) 100 MG tablet Take 1 tablet (100 mg total) by mouth daily as needed for erectile dysfunction. 6 tablet 11  . ULORIC 40 MG tablet TAKE 1 TABLET BY MOUTH EVERY DAY 30 tablet 6   No current facility-administered medications on file prior to visit.   Allergies  Allergen Reactions  . Penicillins Swelling   History   Social History  . Marital Status: Married    Spouse Name: N/A    Number of Children: N/A  . Years of Education: N/A   Occupational History  . Not on file.   Social History Main Topics  . Smoking status: Never Smoker   . Smokeless tobacco: Never Used  . Alcohol Use: Yes     Comment: "social drinker; last time 08/15/11"  . Drug Use: No  . Sexual Activity: Yes   Other Topics Concern  . Not on file  Social History Narrative      Review of Systems  All other systems reviewed and are negative.      Objective:   Physical Exam  HENT:  Right Ear: External ear normal.  Left Ear: External ear normal.  Nose: Nose normal.  Mouth/Throat: Oropharynx is clear and moist.  Eyes: Left eye exhibits discharge. Left conjunctiva is injected.  Neck: Neck supple.  Cardiovascular: Normal rate, regular rhythm and normal heart sounds.   No murmur heard. Pulmonary/Chest: Effort normal and breath sounds normal. No respiratory distress. He has no wheezes. He has no rales.  Lymphadenopathy:    He has no cervical adenopathy.  Vitals reviewed.         Assessment & Plan:  Acute upper respiratory infection - Plan: azithromycin (ZITHROMAX) 250 MG tablet  Pink eye  disease of left eye - Plan: trimethoprim-polymyxin b (POLYTRIM) ophthalmic solution  I will treat his pink Polytrim 2 drops every 4 hours as needed for 5 days. I explained to the patient a believe he has a viral upper respiratory infection. I recommended tincture of time and Mucinex DM for cough. If he develops a high fever or purulent sputum I gave the patient a prescription for a Z-Pak but I explained to the patient that this is most likely a virus and he needs to give it another 3 or 4 days to resolve.

## 2014-11-02 ENCOUNTER — Telehealth: Payer: Self-pay | Admitting: Family Medicine

## 2014-11-02 MED ORDER — FEBUXOSTAT 40 MG PO TABS: 40.0000 mg | ORAL_TABLET | Freq: Every day | ORAL | Status: DC

## 2014-11-02 MED ORDER — CARVEDILOL 12.5 MG PO TABS: 12.5000 mg | ORAL_TABLET | Freq: Two times a day (BID) | ORAL | Status: DC

## 2014-11-02 MED ORDER — AMLODIPINE-OLMESARTAN 5-40 MG PO TABS: 1.0000 | ORAL_TABLET | Freq: Every day | ORAL | Status: DC

## 2014-11-02 NOTE — Telephone Encounter (Signed)
Medication refilled per protocol. 

## 2014-11-04 ENCOUNTER — Telehealth: Payer: Self-pay | Admitting: Family Medicine

## 2014-11-04 MED ORDER — FEBUXOSTAT 40 MG PO TABS: 40.0000 mg | ORAL_TABLET | Freq: Every day | ORAL | Status: DC

## 2014-11-04 NOTE — Telephone Encounter (Signed)
Medication refilled per protocol. 

## 2014-12-06 ENCOUNTER — Other Ambulatory Visit: Payer: Self-pay | Admitting: Family Medicine

## 2014-12-20 ENCOUNTER — Other Ambulatory Visit: Payer: Self-pay | Admitting: Physician Assistant

## 2014-12-20 NOTE — Telephone Encounter (Signed)
Medication refilled per protocol. 

## 2015-04-04 ENCOUNTER — Other Ambulatory Visit: Payer: Self-pay | Admitting: Physician Assistant

## 2015-04-04 NOTE — Telephone Encounter (Signed)
Refill appropriate and filled per protocol. 

## 2015-04-11 ENCOUNTER — Other Ambulatory Visit: Payer: Self-pay | Admitting: Physician Assistant

## 2015-04-11 NOTE — Telephone Encounter (Signed)
Medication refilled per protocol. 

## 2015-04-12 ENCOUNTER — Other Ambulatory Visit: Payer: Self-pay | Admitting: Physician Assistant

## 2015-04-12 NOTE — Telephone Encounter (Signed)
Ok to refill 

## 2015-04-12 NOTE — Telephone Encounter (Signed)
Pt is age 81 y/o.  Tell him I am concerned about drop in BP, etc.  If he says he has been using med with no adverse affect and he still wants to use med despite possible adv affects, then can refill for prior quantity + 2 refills.

## 2015-04-14 NOTE — Telephone Encounter (Signed)
Trying to call pt, no answer.  Pharmacy keeps sending requests.  Sent back denied, have pt contact office.

## 2015-07-07 ENCOUNTER — Other Ambulatory Visit: Payer: Self-pay | Admitting: Physician Assistant

## 2015-07-07 NOTE — Telephone Encounter (Signed)
Refill appropriate and filled per protocol. 

## 2015-08-18 ENCOUNTER — Other Ambulatory Visit: Payer: Self-pay | Admitting: Physician Assistant

## 2015-08-21 ENCOUNTER — Encounter: Payer: Self-pay | Admitting: Family Medicine

## 2015-08-21 NOTE — Telephone Encounter (Signed)
Medication refill for one time only.  Patient needs to be seen.  Letter sent for patient to call and schedule 

## 2015-08-23 DIAGNOSIS — E785 Hyperlipidemia, unspecified: Secondary | ICD-10-CM | POA: Diagnosis not present

## 2015-08-23 DIAGNOSIS — Z23 Encounter for immunization: Secondary | ICD-10-CM | POA: Diagnosis not present

## 2015-08-23 DIAGNOSIS — I129 Hypertensive chronic kidney disease with stage 1 through stage 4 chronic kidney disease, or unspecified chronic kidney disease: Secondary | ICD-10-CM | POA: Diagnosis not present

## 2015-08-23 DIAGNOSIS — M109 Gout, unspecified: Secondary | ICD-10-CM | POA: Diagnosis not present

## 2015-08-23 DIAGNOSIS — N183 Chronic kidney disease, stage 3 (moderate): Secondary | ICD-10-CM | POA: Diagnosis not present

## 2015-09-08 ENCOUNTER — Other Ambulatory Visit: Payer: Self-pay | Admitting: Family Medicine

## 2015-09-08 ENCOUNTER — Other Ambulatory Visit: Payer: 59

## 2015-09-08 DIAGNOSIS — E785 Hyperlipidemia, unspecified: Secondary | ICD-10-CM | POA: Diagnosis not present

## 2015-09-08 DIAGNOSIS — Z0000 Encounter for general adult medical examination without abnormal findings: Secondary | ICD-10-CM

## 2015-09-08 DIAGNOSIS — I10 Essential (primary) hypertension: Secondary | ICD-10-CM

## 2015-09-08 DIAGNOSIS — N183 Chronic kidney disease, stage 3 unspecified: Secondary | ICD-10-CM

## 2015-09-08 DIAGNOSIS — Z79899 Other long term (current) drug therapy: Secondary | ICD-10-CM

## 2015-09-08 DIAGNOSIS — R739 Hyperglycemia, unspecified: Secondary | ICD-10-CM

## 2015-09-08 DIAGNOSIS — Z125 Encounter for screening for malignant neoplasm of prostate: Secondary | ICD-10-CM

## 2015-09-08 LAB — COMPLETE METABOLIC PANEL WITH GFR
ALT: 11 U/L (ref 9–46)
AST: 14 U/L (ref 10–35)
Albumin: 3.7 g/dL (ref 3.6–5.1)
Alkaline Phosphatase: 86 U/L (ref 40–115)
BUN: 15 mg/dL (ref 7–25)
CHLORIDE: 106 mmol/L (ref 98–110)
CO2: 28 mmol/L (ref 20–31)
Calcium: 8.8 mg/dL (ref 8.6–10.3)
Creat: 1.31 mg/dL — ABNORMAL HIGH (ref 0.70–1.11)
GFR, Est African American: 59 mL/min — ABNORMAL LOW (ref >=60)
GFR, Est Non African American: 51 mL/min — ABNORMAL LOW (ref >=60)
Glucose, Bld: 92 mg/dL (ref 70–99)
Potassium: 4.8 mmol/L (ref 3.5–5.3)
Sodium: 143 mmol/L (ref 135–146)
Total Bilirubin: 0.5 mg/dL (ref 0.2–1.2)
Total Protein: 6.2 g/dL (ref 6.1–8.1)

## 2015-09-08 LAB — CBC WITH DIFFERENTIAL/PLATELET
Basophils Absolute: 0 10*3/uL (ref 0.0–0.1)
Basophils Relative: 0 % (ref 0–1)
EOS ABS: 0.1 10*3/uL (ref 0.0–0.7)
Eosinophils Relative: 3 % (ref 0–5)
HEMATOCRIT: 37.2 % — AB (ref 39.0–52.0)
Hemoglobin: 12.9 g/dL — ABNORMAL LOW (ref 13.0–17.0)
LYMPHS ABS: 2 10*3/uL (ref 0.7–4.0)
LYMPHS PCT: 41 % (ref 12–46)
MCH: 27.6 pg (ref 26.0–34.0)
MCHC: 34.7 g/dL (ref 30.0–36.0)
MCV: 79.7 fL (ref 78.0–100.0)
MONOS PCT: 11 % (ref 3–12)
MPV: 9.1 fL (ref 8.6–12.4)
Monocytes Absolute: 0.5 10*3/uL (ref 0.1–1.0)
NEUTROS ABS: 2.2 10*3/uL (ref 1.7–7.7)
NEUTROS PCT: 45 % (ref 43–77)
Platelets: 195 10*3/uL (ref 150–400)
RBC: 4.67 MIL/uL (ref 4.22–5.81)
RDW: 13.8 % (ref 11.5–15.5)
WBC: 4.8 10*3/uL (ref 4.0–10.5)

## 2015-09-08 LAB — LIPID PANEL
CHOLESTEROL: 122 mg/dL — AB (ref 125–200)
HDL: 50 mg/dL (ref >=40)
LDL Cholesterol: 63 mg/dL (ref <130)
TRIGLYCERIDES: 47 mg/dL (ref <150)
Total CHOL/HDL Ratio: 2.4 Ratio (ref <=5.0)
VLDL: 9 mg/dL (ref <30)

## 2015-09-08 LAB — TSH: TSH: 4.584 u[IU]/mL — ABNORMAL HIGH (ref 0.350–4.500)

## 2015-09-09 LAB — PSA, MEDICARE: PSA: 2.2 ng/mL (ref <=4.00)

## 2015-09-09 LAB — HEMOGLOBIN A1C
Hgb A1c MFr Bld: 6.6 % — ABNORMAL HIGH (ref <5.7)
MEAN PLASMA GLUCOSE: 143 mg/dL — AB (ref <117)

## 2015-09-12 ENCOUNTER — Encounter: Payer: Self-pay | Admitting: Family Medicine

## 2015-09-13 ENCOUNTER — Encounter: Payer: Self-pay | Admitting: Physician Assistant

## 2015-09-13 ENCOUNTER — Encounter: Payer: 59 | Admitting: Physician Assistant

## 2015-09-13 ENCOUNTER — Ambulatory Visit (INDEPENDENT_AMBULATORY_CARE_PROVIDER_SITE_OTHER): Payer: 59 | Admitting: Physician Assistant

## 2015-09-13 VITALS — BP 132/84 | HR 68 | Temp 98.5°F | Resp 18 | Ht 67.0 in | Wt 171.0 lb

## 2015-09-13 DIAGNOSIS — N181 Chronic kidney disease, stage 1: Secondary | ICD-10-CM | POA: Diagnosis not present

## 2015-09-13 DIAGNOSIS — N529 Male erectile dysfunction, unspecified: Secondary | ICD-10-CM | POA: Diagnosis not present

## 2015-09-13 DIAGNOSIS — I5022 Chronic systolic (congestive) heart failure: Secondary | ICD-10-CM | POA: Diagnosis not present

## 2015-09-13 DIAGNOSIS — M109 Gout, unspecified: Secondary | ICD-10-CM | POA: Diagnosis not present

## 2015-09-13 DIAGNOSIS — N183 Chronic kidney disease, stage 3 unspecified: Secondary | ICD-10-CM

## 2015-09-13 DIAGNOSIS — Q600 Renal agenesis, unilateral: Secondary | ICD-10-CM | POA: Diagnosis not present

## 2015-09-13 DIAGNOSIS — E785 Hyperlipidemia, unspecified: Secondary | ICD-10-CM

## 2015-09-13 DIAGNOSIS — IMO0002 Reserved for concepts with insufficient information to code with codable children: Secondary | ICD-10-CM

## 2015-09-13 DIAGNOSIS — Z0000 Encounter for general adult medical examination without abnormal findings: Secondary | ICD-10-CM | POA: Diagnosis not present

## 2015-09-13 DIAGNOSIS — R739 Hyperglycemia, unspecified: Secondary | ICD-10-CM | POA: Diagnosis not present

## 2015-09-13 DIAGNOSIS — I10 Essential (primary) hypertension: Secondary | ICD-10-CM | POA: Diagnosis not present

## 2015-09-13 MED ORDER — SILDENAFIL CITRATE 100 MG PO TABS: 100.0000 mg | ORAL_TABLET | Freq: Every day | ORAL | Status: DC | PRN

## 2015-09-13 NOTE — Progress Notes (Signed)
Patient ID: John Hines MRN: [id number], DOB: Nov 24, 1931 81 y.o. Date of Encounter: 09/13/2015, 1:30 PM    Chief Complaint: Physical (CPE)  HPI: 81 y.o. y/o AA male here for CPE.   He has no complaints or concerns today. Says that he feels great. Says that he is still working, just like usual.  Review of Systems: Consitutional: No fever, chills, fatigue, night sweats, lymphadenopathy, or weight changes. Eyes: No visual changes, eye redness, or discharge. ENT/Mouth: Ears: No otalgia, tinnitus, hearing loss, discharge. Nose: No congestion, rhinorrhea, sinus pain, or epistaxis. Throat: No sore throat, post nasal drip, or teeth pain. Cardiovascular: No CP, palpitations, diaphoresis, DOE, edema, orthopnea, PND. Respiratory: No cough, hemoptysis, SOB, or wheezing. Gastrointestinal: No anorexia, dysphagia, reflux, pain, nausea, vomiting, hematemesis, diarrhea, constipation, BRBPR, or melena. Genitourinary: No dysuria, frequency, urgency, hematuria, incontinence, nocturia, decreased urinary stream, discharge, impotence, or testicular pain/masses. Musculoskeletal: No decreased ROM, myalgias, stiffness, joint swelling, or weakness. Skin: No rash, erythema, lesion changes, pain, warmth, jaundice, or pruritis. Neurological: No headache, dizziness, syncope, seizures, tremors, memory loss, coordination problems, or paresthesias. Psychological: No anxiety, depression, hallucinations, SI/HI. Endocrine: No fatigue, polydipsia, polyphagia, polyuria, or known diabetes. All other systems were reviewed and are otherwise negative.  Past Medical History  Diagnosis Date  . ED (erectile dysfunction)   . BPH (benign prostatic hyperplasia)   . Hyperlipidemia   . Hypertension   . Dyslipidemia   . Arthritis   . Gout   . Heart murmur   . Dysrhythmia 08/19/11    "low heart beat; maybe 25bpm; got RX; now 60bpm"  . Stroke [facility])     "mild stroke; dx'd 2010; don't know when it happened"  . Syncope  and collapse 08/19/11    "shallow breathing; sweating horribly"  . Chronic kidney disease 05/20/2013    CKD 3  . Solitary kidney Recognized 03/2006    Solitary Functioning Left Kidney  . Nonfunctioning kidney Recognized 03/2006    Chronically and Severely Hydronephrotic and Nonfunctioning Right Kidney  . CHF (congestive heart failure) ([place])     Nonischemic Cardiomyopathy. EF 25-35%  . Colon polyps   . Low back pain 08/12/2013     Past Surgical History  Procedure Laterality Date  . Knee arthroscopy  1996    right  . Cardiovascular stress test  08/30/2010    Small to moderate fixed inferoseptal defect. Severe global hypokinesis with inferior akinesis and septal dyskinesis-likely secondary to a prominent LBBB. Non-diagnostic for ischemia due to persantine.  . Transthoracic echocardiogram  08/20/2011    EF 45-50%, severe concentric hyperthrophy.    Home Meds:  Outpatient Prescriptions Prior to Visit  Medication Sig Dispense Refill  . aspirin EC 81 MG tablet Take 81 mg by mouth daily.      Marland Kitchen atorvastatin (LIPITOR) 80 MG tablet TAKE 1 TABLET (80 MG TOTAL) BY MOUTH DAILY. 90 tablet 1  . AZOR 5-40 MG tablet TAKE 1 TABLET BY MOUTH DAILY. 30 tablet 0  . carvedilol (COREG) 12.5 MG tablet TAKE 1 TABLET (12.5 MG TOTAL) BY MOUTH 2 (TWO) TIMES DAILY WITH A MEAL. 180 tablet 1  . febuxostat (ULORIC) 40 MG tablet Take 1 tablet (40 mg total) by mouth daily. 90 tablet 1  . ferrous sulfate 325 (65 FE) MG tablet TAKE 1 TABLET BY MOUTH EVERY DAY 30 tablet 5  . sildenafil (VIAGRA) 100 MG tablet Take 1 tablet (100 mg total) by mouth daily as needed for erectile dysfunction. 6 tablet 11  . sildenafil (REVATIO)  20 MG tablet TAKE 3-5 TABLETS BY MOUTH EVERY DAY AS NEEDED ERECTILE DYSFUNCTION (Patient not taking: Reported on 09/13/2015) 30 tablet 3  . azithromycin (ZITHROMAX) 250 MG tablet 2 tabs poqday1, 1 tab poqday 2-5 6 tablet 0  . trimethoprim-polymyxin b (POLYTRIM) ophthalmic solution Place 2 drops into the  left eye every 4 (four) hours. 10 mL 0   No facility-administered medications prior to visit.    Allergies:  Allergies  Allergen Reactions  . Penicillins Swelling    Social History   Social History  . Marital Status: Married    Spouse Name: N/A  . Number of Children: N/A  . Years of Education: N/A   Occupational History  . Not on file.   Social History Main Topics  . Smoking status: Never Smoker   . Smokeless tobacco: Never Used  . Alcohol Use: Yes     Comment: "social drinker; last time 08/15/11"  . Drug Use: No  . Sexual Activity: Yes   Other Topics Concern  . Not on file   Social History Narrative    Family History  Problem Relation Age of Onset  . Cancer Father     Physical Exam: Blood pressure 132/84, pulse 68, temperature 98.5 F (36.9 C), temperature source Oral, resp. rate 18, height 5\' 7"  (1.702 m), weight 171 lb (77.565 kg).  General: Well developed, well nourished AAM. Appears in no acute distress. HEENT: Normocephalic, atraumatic. Conjunctiva pink, sclera non-icteric. Pupils 2 mm constricting to 1 mm, round, regular, and equally reactive to light and accomodation. EOMI. Internal auditory canal clear. TMs with good cone of light and without pathology. Nasal mucosa pink. Nares are without discharge. No sinus tenderness. Oral mucosa pink. Pharynx without exudate.   Neck: Supple. Trachea midline. No thyromegaly. Full ROM. No lymphadenopathy. No carotid bruit. Lungs: Clear to auscultation bilaterally without wheezes, rales, or rhonchi. Breathing is of normal effort and unlabored. Cardiovascular: RRR with S1 S2. No murmurs, rubs, or gallops. Distal pulses 2+ symmetrically. No carotid or abdominal bruits. Abdomen: Soft, non-tender, non-distended with normoactive bowel sounds. No hepatosplenomegaly or masses. No rebound/guarding. No CVA tenderness. No hernias. Musculoskeletal: Full range of motion and 5/5 strength throughout. Skin: Warm and moist without  erythema, ecchymosis, wounds, or rash. Neuro: A+Ox3. CN II-XII grossly intact. Moves all extremities spontaneously. Full sensation throughout. Normal gait. DTR 2+ throughout upper and lower extremities.  Psych:  Responds to questions appropriately with a normal affect.   Assessment/Plan:  81 y.o. y/o AA male here for CPE  -1. Visit for preventive health examination  A. Screening Labs: He recently came in fasting for labs. All labs are excellent. Kidney function stable/slightly improved.  B. Screening For Prostate Cancer: PSA normal.   C. Screening For Colorectal Cancer:  Given age 30, does not need further screening  D. Immunizations: Flu------------------------- he has received influenza vaccine 08/23/15 Tetanus------------------- TD 09/23/2005 Pneumococcal------------ he received Pneumovax 23-- 11/22/11. Received Prevnar 13 on 09/22/13 Zostavax------------------- needs to find out coverage/cost and follow-up regarding this   2. Medicare annual wellness visit, subsequent This is documented below.  3. Hyperlipidemia Lipid panel is excellent. LFTs normal. Continue current dose of statin.  4. Essential hypertension Blood pressure at goal. Labs stable. Continue current medications.   Hyperglycemia This has remained stable.  Chronic systolic heart failure ([place]) He remains clinically stable regarding this. However I reviewed with him today that his last visit with Dr. Claiborne Billings was February 2015 and he was supposed to be following up with him annually. Patient  promises to go home and call and schedule follow-up appointment with cardiology to day.  Gout without tophus, unspecified cause, unspecified chronicity, unspecified site He states that he has had no gout flares.  Solitary kidney He has been evaluated by Dr. Lorrene Reid at [facility]. Renal function is stable on labs now.  Chronic kidney disease, stage 1 He has been evaluated by Dr. Lorrene Reid at [facility]. Renal function is stable on labs now.  Erectile dysfunction, unspecified erectile dysfunction type - sildenafil (VIAGRA) 100 MG tablet; Take 1 tablet (100 mg total) by mouth daily as needed for erectile dysfunction.  Dispense: 6 tablet; Refill: 11    Subjective:   Patient presents for Medicare Annual/Subsequent preventive examination.   Review Past Medical/Family/Social: This is all reviewed today. Documented above and in epic tabs.  Risk Factors  Current exercise habits: He continues to work. Dietary issues discussed:  He eats a low-sodium low-cholesterol low carbohydrate diet.  Cardiac risk factors: TIA, Age, Male, HTN, HLD  Depression Screen  (Note: if answer to either of the following is "Yes", a more complete depression screening is indicated)  Over the past two weeks, have you felt down, depressed or hopeless? No Over the past two weeks, have you felt little interest or pleasure in doing things? No Have you lost interest or pleasure in daily life? No Do you often feel hopeless? No Do you cry easily over simple problems? No   Activities of Daily Living  In your present state of health, do you have any difficulty performing the following activities?:  Driving? No  Managing money? No  Feeding yourself? No  Getting from bed to chair? No  Climbing a flight of stairs? No  Preparing food and eating?: No  Bathing or showering? No  Getting dressed: No  Getting to the toilet? No  Using the toilet:No  Moving around from place to place: No  In the past year have you fallen or had a near fall?:No  Are you sexually active? No  Do you have more than one partner? No   Hearing Difficulties: No  Do you often ask people to speak up or repeat themselves? No  Do you experience ringing or noises in your ears? No Do you have difficulty understanding soft or whispered voices? No  Do you feel that you have a problem with memory? No Do you often misplace items? No  Do you  feel safe at home? Yes  Cognitive Testing  Alert? Yes Normal Appearance?Yes  Oriented to person? Yes Place? Yes  Time? Yes  Recall of three objects? Yes  Can perform simple calculations? Yes  Displays appropriate judgment?Yes  Can read the correct time from a watch face?Yes   List the Names of Other Physician/Practitioners you currently use:  Dr. Shelva Majestic cardiology Dr. Jamal Maes [facility]  Indicate any recent Medical Services you may have received from other than [facility] providers in the past year (date may be approximate).  Dr. Jamal Maes-- [facility]  Screening Tests / Date--- all of this information is documented above. See above. Colonoscopy                     Zostavax  Mammogram  Influenza Vaccine  Tetanus/tdap    Assessment:    Annual wellness medicare exam   Plan:    During the course of the visit the patient was educated and counseled about appropriate screening and preventive services including:  Screening mammography  Colorectal cancer screening  Shingles vaccine. Prescription given to that she can get the vaccine at the pharmacy or Medicare part D.  Screen + for depression. PHQ- 9 score of 12 (moderate depression). We discussed the options of counseling versus possibly a medication. I encouraged her strongly think about the counseling. She is going through some medical problems currently and her husband is as well Mrs. been very stressful for her. She says she will think about it. She does have Xanax to use as needed. Though she may benefit from an SSRI for her more depressive type symptoms but she wants to hold off at this time.  I aksed her to please have her cardioloist send records since we have none on file.  Diet review for nutrition referral? Yes ____ Not Indicated __x__  Patient Instructions (the written plan) was given to the patient.  Medicare Attestation  I have personally reviewed:  The patient's medical  and social history  Their use of alcohol, tobacco or illicit drugs  Their current medications and supplements  The patient's functional ability including ADLs,fall risks, home safety risks, cognitive, and hearing and visual impairment  Diet and physical activities  Evidence for depression or mood disorders  The patient's weight, height, BMI, and visual acuity have been recorded in the chart. I have made referrals, counseling, and provided education to the patient based on review of the above and I have provided the patient with a written personalized care plan for preventive services.        Signed:   [address] [place], [place]  09/13/2015 1:30 PM

## 2015-09-15 ENCOUNTER — Other Ambulatory Visit: Payer: Self-pay | Admitting: Physician Assistant

## 2015-09-15 NOTE — Telephone Encounter (Signed)
Medication refilled per protocol. 

## 2015-10-27 DIAGNOSIS — E872 Acidosis: Secondary | ICD-10-CM | POA: Diagnosis not present

## 2015-10-27 DIAGNOSIS — N179 Acute kidney failure, unspecified: Secondary | ICD-10-CM | POA: Diagnosis not present

## 2015-10-27 DIAGNOSIS — E875 Hyperkalemia: Secondary | ICD-10-CM | POA: Diagnosis not present

## 2015-10-28 DIAGNOSIS — E872 Acidosis: Secondary | ICD-10-CM | POA: Diagnosis not present

## 2015-10-28 DIAGNOSIS — N179 Acute kidney failure, unspecified: Secondary | ICD-10-CM | POA: Diagnosis not present

## 2015-10-28 DIAGNOSIS — E875 Hyperkalemia: Secondary | ICD-10-CM | POA: Diagnosis not present

## 2015-10-29 DIAGNOSIS — E875 Hyperkalemia: Secondary | ICD-10-CM | POA: Diagnosis not present

## 2015-10-29 DIAGNOSIS — E872 Acidosis: Secondary | ICD-10-CM | POA: Diagnosis not present

## 2015-10-29 DIAGNOSIS — N179 Acute kidney failure, unspecified: Secondary | ICD-10-CM | POA: Diagnosis not present

## 2015-10-30 ENCOUNTER — Telehealth: Payer: Self-pay | Admitting: Physician Assistant

## 2015-10-30 DIAGNOSIS — E872 Acidosis: Secondary | ICD-10-CM | POA: Diagnosis not present

## 2015-10-30 DIAGNOSIS — N179 Acute kidney failure, unspecified: Secondary | ICD-10-CM | POA: Diagnosis not present

## 2015-10-30 DIAGNOSIS — E875 Hyperkalemia: Secondary | ICD-10-CM | POA: Diagnosis not present

## 2015-10-30 NOTE — Telephone Encounter (Signed)
This was already done.  Told pt to call pharmacy, if problem call us back

## 2015-10-30 NOTE — Telephone Encounter (Signed)
Patient calling to discuss changing from viagra to jildenafil 20 mg if possible to go to the [facility] [place] if possible  [phone] is his phone #

## 2015-10-31 DIAGNOSIS — E872 Acidosis: Secondary | ICD-10-CM | POA: Diagnosis not present

## 2015-10-31 DIAGNOSIS — E875 Hyperkalemia: Secondary | ICD-10-CM | POA: Diagnosis not present

## 2015-10-31 DIAGNOSIS — N179 Acute kidney failure, unspecified: Secondary | ICD-10-CM | POA: Diagnosis not present

## 2015-11-01 DIAGNOSIS — E875 Hyperkalemia: Secondary | ICD-10-CM | POA: Diagnosis not present

## 2015-11-01 DIAGNOSIS — N179 Acute kidney failure, unspecified: Secondary | ICD-10-CM | POA: Diagnosis not present

## 2015-11-01 DIAGNOSIS — E872 Acidosis: Secondary | ICD-10-CM | POA: Diagnosis not present

## 2015-11-02 DIAGNOSIS — E875 Hyperkalemia: Secondary | ICD-10-CM | POA: Diagnosis not present

## 2015-11-02 DIAGNOSIS — E872 Acidosis: Secondary | ICD-10-CM | POA: Diagnosis not present

## 2015-11-02 DIAGNOSIS — N179 Acute kidney failure, unspecified: Secondary | ICD-10-CM | POA: Diagnosis not present

## 2015-11-03 DIAGNOSIS — E875 Hyperkalemia: Secondary | ICD-10-CM | POA: Diagnosis not present

## 2015-11-03 DIAGNOSIS — N179 Acute kidney failure, unspecified: Secondary | ICD-10-CM | POA: Diagnosis not present

## 2015-11-03 DIAGNOSIS — E872 Acidosis: Secondary | ICD-10-CM | POA: Diagnosis not present

## 2015-11-04 DIAGNOSIS — E872 Acidosis: Secondary | ICD-10-CM | POA: Diagnosis not present

## 2015-11-04 DIAGNOSIS — N179 Acute kidney failure, unspecified: Secondary | ICD-10-CM | POA: Diagnosis not present

## 2015-11-04 DIAGNOSIS — E875 Hyperkalemia: Secondary | ICD-10-CM | POA: Diagnosis not present

## 2015-11-05 DIAGNOSIS — N179 Acute kidney failure, unspecified: Secondary | ICD-10-CM | POA: Diagnosis not present

## 2015-11-05 DIAGNOSIS — E872 Acidosis: Secondary | ICD-10-CM | POA: Diagnosis not present

## 2015-11-05 DIAGNOSIS — E875 Hyperkalemia: Secondary | ICD-10-CM | POA: Diagnosis not present

## 2015-11-24 ENCOUNTER — Other Ambulatory Visit: Payer: Self-pay | Admitting: Physician Assistant

## 2015-11-24 NOTE — Telephone Encounter (Signed)
Medication refilled per protocol. 

## 2015-11-28 ENCOUNTER — Ambulatory Visit: Payer: Self-pay | Admitting: Cardiovascular Disease

## 2015-12-16 ENCOUNTER — Other Ambulatory Visit: Payer: Self-pay | Admitting: Physician Assistant

## 2015-12-18 NOTE — Telephone Encounter (Signed)
Medication refilled per protocol. 

## 2016-01-03 ENCOUNTER — Other Ambulatory Visit: Payer: Self-pay | Admitting: Physician Assistant

## 2016-01-25 ENCOUNTER — Other Ambulatory Visit: Payer: Self-pay | Admitting: Physician Assistant

## 2016-01-25 NOTE — Telephone Encounter (Signed)
Medication refilled per protocol. 

## 2016-03-14 ENCOUNTER — Ambulatory Visit: Payer: 59 | Admitting: Physician Assistant

## 2016-03-21 ENCOUNTER — Ambulatory Visit (INDEPENDENT_AMBULATORY_CARE_PROVIDER_SITE_OTHER): Payer: Medicare Other | Admitting: Physician Assistant

## 2016-03-21 VITALS — BP 128/64 | HR 72 | Temp 98.6°F | Resp 16 | Ht 67.0 in | Wt 170.0 lb

## 2016-03-21 DIAGNOSIS — M109 Gout, unspecified: Secondary | ICD-10-CM

## 2016-03-21 DIAGNOSIS — N400 Enlarged prostate without lower urinary tract symptoms: Secondary | ICD-10-CM

## 2016-03-21 DIAGNOSIS — N183 Chronic kidney disease, stage 3 unspecified: Secondary | ICD-10-CM

## 2016-03-21 DIAGNOSIS — I10 Essential (primary) hypertension: Secondary | ICD-10-CM

## 2016-03-21 DIAGNOSIS — R739 Hyperglycemia, unspecified: Secondary | ICD-10-CM

## 2016-03-21 DIAGNOSIS — E785 Hyperlipidemia, unspecified: Secondary | ICD-10-CM

## 2016-03-21 DIAGNOSIS — E039 Hypothyroidism, unspecified: Secondary | ICD-10-CM

## 2016-03-21 DIAGNOSIS — I5022 Chronic systolic (congestive) heart failure: Secondary | ICD-10-CM

## 2016-03-21 DIAGNOSIS — Q600 Renal agenesis, unilateral: Secondary | ICD-10-CM

## 2016-03-21 DIAGNOSIS — IMO0002 Reserved for concepts with insufficient information to code with codable children: Secondary | ICD-10-CM

## 2016-03-21 LAB — LIPID PANEL
CHOL/HDL RATIO: 3.2 ratio (ref <=5.0)
CHOLESTEROL: 153 mg/dL (ref 125–200)
HDL: 48 mg/dL (ref >=40)
LDL Cholesterol: 93 mg/dL (ref <130)
TRIGLYCERIDES: 58 mg/dL (ref <150)
VLDL: 12 mg/dL (ref <30)

## 2016-03-21 LAB — URIC ACID: URIC ACID, SERUM: 3.6 mg/dL — AB (ref 4.0–8.0)

## 2016-03-21 LAB — COMPLETE METABOLIC PANEL WITH GFR
ALBUMIN: 3.8 g/dL (ref 3.6–5.1)
ALK PHOS: 97 U/L (ref 40–115)
ALT: 11 U/L (ref 9–46)
AST: 14 U/L (ref 10–35)
BUN: 18 mg/dL (ref 7–25)
CO2: 29 mmol/L (ref 20–31)
Calcium: 9.2 mg/dL (ref 8.6–10.3)
Chloride: 107 mmol/L (ref 98–110)
Creat: 1.31 mg/dL — ABNORMAL HIGH (ref 0.70–1.11)
GFR, EST NON AFRICAN AMERICAN: 50 mL/min — AB (ref >=60)
GFR, Est African American: 58 mL/min — ABNORMAL LOW (ref >=60)
GLUCOSE: 103 mg/dL — AB (ref 70–99)
POTASSIUM: 4.7 mmol/L (ref 3.5–5.3)
SODIUM: 143 mmol/L (ref 135–146)
Total Bilirubin: 0.7 mg/dL (ref 0.2–1.2)
Total Protein: 6.6 g/dL (ref 6.1–8.1)

## 2016-03-21 LAB — TSH: TSH: 4.14 m[IU]/L (ref 0.40–4.50)

## 2016-03-21 LAB — T4, FREE: FREE T4: 0.9 ng/dL (ref 0.8–1.8)

## 2016-03-21 MED ORDER — TAMSULOSIN HCL 0.4 MG PO CAPS: 0.4000 mg | ORAL_CAPSULE | Freq: Every day | ORAL | Status: DC

## 2016-03-21 NOTE — Progress Notes (Signed)
Patient ID: TELLY COREAS MRN: [id number], DOB: 01/11/1932 82 y.o. Date of Encounter: 03/21/2016, 8:54 AM    Chief Complaint: Routine OV--f/u hyperlipidemia, etc  HPI: 82 y.o. y/o AA male here for routine OV.   He has no complaints or concerns today. Says that he feels great. Says that he is still working, just like usual.  Says that he did want me to know about some mild pain in his left knee. Says it is just causing a little bit of a problem right now but if he gets worse may need treatment. Says that he had arthroscopic surgery to the right knee about 20 years ago and even at that time they told him that he would probably eventually have problems with the left knee. He says that when he squats down when he tries to get up he feels discomfort in the medial aspect of the knee. I discussed with him that he needs to decide how much it is bothering him and whether he wants me to do referral to orthopedics. He says he has wife has stage IV cancer and he does not want to have any type of surgery right now because he needs to be available to help care for her. Says that down the road he may need to do something but not right now. I told him to just call me and I can do a referral to orthopedics.  Today he also reports that he has noticed some change with his urination. Says that it takes him a long time to go. In the beginning does have some hesitancy and at the end has dribbling. He is having no nocturia. Says that he never wakes up to have to urinate during the night.  He is taking cholesterol medication as directed. No myalgias or other adverse effects.  He has had no gout flares since last visit.  At last visit I encouraged him to schedule appointment to follow-up with Dr. Claiborne Billings at cardiology. Today he says that he missed that appointment.  He does see Dr. Lorrene Reid at [facility] once a year.  Review of Systems: Consitutional: No fever, chills, fatigue, night sweats,  lymphadenopathy, or weight changes. Eyes: No visual changes, eye redness, or discharge. ENT/Mouth: Ears: No otalgia, tinnitus, hearing loss, discharge. Nose: No congestion, rhinorrhea, sinus pain, or epistaxis. Throat: No sore throat, post nasal drip, or teeth pain. Cardiovascular: No CP, palpitations, diaphoresis, DOE, edema, orthopnea, PND. Respiratory: No cough, hemoptysis, SOB, or wheezing. Gastrointestinal: No anorexia, dysphagia, reflux, pain, nausea, vomiting, hematemesis, diarrhea, constipation, BRBPR, or melena. Genitourinary: No dysuria, frequency, urgency, hematuria, incontinence, nocturia, decreased urinary stream, discharge, impotence, or testicular pain/masses. Musculoskeletal: No decreased ROM, myalgias, stiffness, joint swelling, or weakness. Skin: No rash, erythema, lesion changes, pain, warmth, jaundice, or pruritis. Neurological: No headache, dizziness, syncope, seizures, tremors, memory loss, coordination problems, or paresthesias. Psychological: No anxiety, depression, hallucinations, SI/HI. Endocrine: No fatigue, polydipsia, polyphagia, polyuria, or known diabetes. All other systems were reviewed and are otherwise negative.  Past Medical History  Diagnosis Date  . ED (erectile dysfunction)   . BPH (benign prostatic hyperplasia)   . Hyperlipidemia   . Hypertension   . Dyslipidemia   . Arthritis   . Gout   . Heart murmur   . Dysrhythmia 08/19/11    "low heart beat; maybe 25bpm; got RX; now 60bpm"  . Stroke [facility])     "mild stroke; dx'd 2010; don't know when it happened"  . Syncope and collapse 08/19/11    "  shallow breathing; sweating horribly"  . Chronic kidney disease 05/20/2013    CKD 3  . Solitary kidney Recognized 03/2006    Solitary Functioning Left Kidney  . Nonfunctioning kidney Recognized 03/2006    Chronically and Severely Hydronephrotic and Nonfunctioning Right Kidney  . CHF (congestive heart failure) ([place])     Nonischemic Cardiomyopathy. EF 25-35%  .  Colon polyps   . Low back pain 08/12/2013     Past Surgical History  Procedure Laterality Date  . Knee arthroscopy  1996    right  . Cardiovascular stress test  08/30/2010    Small to moderate fixed inferoseptal defect. Severe global hypokinesis with inferior akinesis and septal dyskinesis-likely secondary to a prominent LBBB. Non-diagnostic for ischemia due to persantine.  . Transthoracic echocardiogram  08/20/2011    EF 45-50%, severe concentric hyperthrophy.    Home Meds:  Outpatient Prescriptions Prior to Visit  Medication Sig Dispense Refill  . amLODipine-olmesartan (AZOR) 5-40 MG tablet TAKE 1 TABLET BY MOUTH DAILY. 90 tablet 1  . aspirin EC 81 MG tablet Take 81 mg by mouth daily.      Marland Kitchen atorvastatin (LIPITOR) 80 MG tablet TAKE 1 TABLET (80 MG TOTAL) BY MOUTH DAILY. 90 tablet 0  . carvedilol (COREG) 12.5 MG tablet TAKE 1 TABLET (12.5 MG TOTAL) BY MOUTH 2 (TWO) TIMES DAILY WITH A MEAL. 180 tablet 1  . ferrous sulfate 325 (65 FE) MG tablet TAKE 1 TABLET BY MOUTH EVERY DAY 30 tablet 5  . sildenafil (VIAGRA) 100 MG tablet Take 1 tablet (100 mg total) by mouth daily as needed for erectile dysfunction. 6 tablet 11  . ULORIC 40 MG tablet TAKE 1 TABLET (40 MG TOTAL) BY MOUTH DAILY. 90 tablet 1  . sildenafil (REVATIO) 20 MG tablet TAKE 3-5 TABLETS BY MOUTH EVERY DAY AS NEEDED ERECTILE DYSFUNCTION (Patient not taking: Reported on 09/13/2015) 30 tablet 3   No facility-administered medications prior to visit.    Allergies:  Allergies  Allergen Reactions  . Penicillins Swelling    Social History   Social History  . Marital Status: Married    Spouse Name: N/A  . Number of Children: N/A  . Years of Education: N/A   Occupational History  . Not on file.   Social History Main Topics  . Smoking status: Never Smoker   . Smokeless tobacco: Never Used  . Alcohol Use: Yes     Comment: "social drinker; last time 08/15/11"  . Drug Use: No  . Sexual Activity: Yes   Other Topics  Concern  . Not on file   Social History Narrative    Family History  Problem Relation Age of Onset  . Cancer Father     Physical Exam: Blood pressure 128/64, pulse 72, temperature 98.6 F (37 C), temperature source Oral, resp. rate 16, height 5\' 7"  (1.702 m), weight 170 lb (77.111 kg).  General: Well developed, well nourished AAM. Appears in no acute distress. Neck: Supple. Trachea midline. No thyromegaly. Full ROM. No lymphadenopathy. No carotid bruit. Lungs: Clear to auscultation bilaterally without wheezes, rales, or rhonchi. Breathing is of normal effort and unlabored. Cardiovascular: RRR with S1 S2. No murmurs, rubs, or gallops. Distal pulses 2+ symmetrically. No carotid or abdominal bruits. Abdomen: Soft, non-tender, non-distended with normoactive bowel sounds. No hepatosplenomegaly or masses. No rebound/guarding. No CVA tenderness. No hernias. Musculoskeletal: Full range of motion and 5/5 strength throughout. Skin: Warm and moist without erythema, ecchymosis, wounds, or rash. Neuro: A+Ox3. CN II-XII grossly intact. Moves all  extremities spontaneously. Full sensation throughout. Normal gait. Psych:  Responds to questions appropriately with a normal affect.   Assessment/Plan:  82 y.o. y/o AA male here for    Hyperlipidemia He is on statin. Recheck labs now to monitor.   Essential hypertension Blood pressure at goal. Check labs to monitor. Continue current medications.   Hyperglycemia This has remained stable. Recheck A1C now  Chronic systolic heart failure ([place]) He remains clinically stable regarding this. At [place] 08/2015---" However I reviewed with him today that his last visit with Dr. Claiborne Billings was February 2015 and he was supposed to be following up with him annually. Patient promises to go home and call and schedule follow-up appointment with cardiology to day." At [place] 02/2016-- he says that he missed his appointment with Dr. Claiborne Billings  Gout without tophus, unspecified cause,  unspecified chronicity, unspecified site He states that he has had no gout flares. Have not checked a uric acid level since 08/2013.  Recheck uric acid level now-- 02/2016  Solitary kidney He has been evaluated by Dr. Lorrene Reid at [facility]. F/U labs now. He sees Dr. Valentina Lucks once a year  Chronic kidney disease, stage 1 He has been evaluated by Dr. Lorrene Reid at [facility]. F/U labs now.  Erectile dysfunction, unspecified erectile dysfunction type - sildenafil (VIAGRA) 100 MG tablet; Take 1 tablet (100 mg total) by mouth daily as needed for erectile dysfunction.  Dispense: 6 tablet; Refill: 11  Hypothyroidism, unspecified hypothyroidism type TSH was slightly abnormal at labs 08/2015. Recheck TSH as well as free T4 now. - TSH - T4, free  BPH (benign prostatic hyperplasia) At OV 02/2016 he has symptoms consistent with BPH. We'll recheck PSA to make sure this is still normal and will start Flomax and see if this helps improve symptoms. - PSA, Medicare - tamsulosin (FLOMAX) 0.4 MG CAPS capsule; Take 1 capsule (0.4 mg total) by mouth daily.  Dispense: 30 capsule; Refill: 11  Knee Pain At visit 02/2016 he reports left knee pain. I told him that if he needs referral in the future to just call me and I will order referral. Note that the orthopedic doctor he saw in the past was a clinic that I have never heard of and he says that it was 20 years ago. Told him that we would just need to start with a new orthopedic for him if and when he needs follow-up for this.    THE FOLLOWING IS COPIED FROM HIS CPE NOTE 09/13/2015: -1. Visit for preventive health examination  A. Screening Labs: He recently came in fasting for labs. All labs are excellent. Kidney function stable/slightly improved.  B. Screening For Prostate Cancer: PSA normal.   C. Screening For Colorectal Cancer:  Given age 81, does not need further screening  D. Immunizations: Flu------------------------- he  has received influenza vaccine 08/23/15 Tetanus------------------- TD 09/23/2005 Pneumococcal------------ he received Pneumovax 23-- 11/22/11. Received Prevnar 13 on 09/22/13 Zostavax------------------- needs to find out coverage/cost and follow-up regarding this  Medicare annual wellness visit, subsequent   Routine f/u OV 6 months, sooner if needed.   Signed:   [address] [place], [place]  03/21/2016 8:54 AM

## 2016-03-22 LAB — HEMOGLOBIN A1C
Hgb A1c MFr Bld: 6.5 % — ABNORMAL HIGH (ref <5.7)
Mean Plasma Glucose: 140 mg/dL

## 2016-03-22 LAB — PSA, MEDICARE: PSA: 1.89 ng/mL (ref <=4.00)

## 2016-04-01 ENCOUNTER — Encounter: Payer: Self-pay | Admitting: Family Medicine

## 2016-04-01 ENCOUNTER — Encounter: Payer: Self-pay | Admitting: Physician Assistant

## 2016-04-01 ENCOUNTER — Ambulatory Visit (INDEPENDENT_AMBULATORY_CARE_PROVIDER_SITE_OTHER): Payer: Medicare Other | Admitting: Physician Assistant

## 2016-04-01 VITALS — BP 122/76 | HR 72 | Temp 98.1°F | Resp 18 | Wt 171.0 lb

## 2016-04-01 DIAGNOSIS — M25562 Pain in left knee: Secondary | ICD-10-CM

## 2016-04-01 NOTE — Progress Notes (Signed)
Patient ID: BAYRO TORELL MRN: [id number], DOB: [birth year], 82 y.o. Date of Encounter: 04/01/2016, 9:44 AM    Chief Complaint:  Chief Complaint  Patient presents with  . left knee pain    has been getting worse over past 2 mth     HPI: 82 y.o. year old AA male presents As above.  He had recent office visit with me and at that visit he reported that he been having some problems with his left knee. However at that visit said that his wife has stage IV cancer and he could not undergo surgery at this point because he needed to be available to help care for her. However today he says that the pain has worsened significantly all of a sudden since that visit. Says now he realizes he is given a have to do something or else he cannot help himself or help her. As the last week at work on Wednesday he noticed it getting worse and on that night and Thursday morning it was much worse. Had to stay out of work last Friday and has been out today. Points to the medial joint line and says that he feels pain there shooting across.     Home Meds:   Outpatient Prescriptions Prior to Visit  Medication Sig Dispense Refill  . amLODipine-olmesartan (AZOR) 5-40 MG tablet TAKE 1 TABLET BY MOUTH DAILY. 90 tablet 1  . aspirin EC 81 MG tablet Take 81 mg by mouth daily.      Marland Kitchen atorvastatin (LIPITOR) 80 MG tablet TAKE 1 TABLET (80 MG TOTAL) BY MOUTH DAILY. 90 tablet 0  . carvedilol (COREG) 12.5 MG tablet TAKE 1 TABLET (12.5 MG TOTAL) BY MOUTH 2 (TWO) TIMES DAILY WITH A MEAL. 180 tablet 1  . ferrous sulfate 325 (65 FE) MG tablet TAKE 1 TABLET BY MOUTH EVERY DAY 30 tablet 5  . sildenafil (VIAGRA) 100 MG tablet Take 1 tablet (100 mg total) by mouth daily as needed for erectile dysfunction. 6 tablet 11  . tamsulosin (FLOMAX) 0.4 MG CAPS capsule Take 1 capsule (0.4 mg total) by mouth daily. 30 capsule 11  . ULORIC 40 MG tablet TAKE 1 TABLET (40 MG TOTAL) BY MOUTH DAILY. 90 tablet 1   No facility-administered  medications prior to visit.    Allergies:  Allergies  Allergen Reactions  . Penicillins Swelling      Review of Systems: See HPI for pertinent ROS. All other ROS negative.    Physical Exam: Blood pressure 122/76, pulse 72, temperature 98.1 F (36.7 C), temperature source Oral, resp. rate 18, weight 171 lb (77.565 kg)., Body mass index is 26.78 kg/(m^2). General:  WNWD AAM. Appears in no acute distress. Neck: Supple. No thyromegaly. No lymphadenopathy. Lungs: Clear bilaterally to auscultation without wheezes, rales, or rhonchi. Breathing is unlabored. Heart: Regular rhythm. No murmurs, rubs, or gallops. Msk:  Strength and tone normal for age. Left Knee: Pain with palpation at medial joint line. Positive grind test.  No effusion. Neg anterior drawer.  Extremities/Skin: Warm and dry. Neuro: Alert and oriented X 3. Moves all extremities spontaneously. Gait is normal. CNII-XII grossly in tact. Psych:  Responds to questions appropriately with a normal affect.     ASSESSMENT AND PLAN:  82 y.o. year old male with  1. Left knee pain Will get him aapointment with Ortho . Kim calling Ortho office now patient is here and we will get him in either today or tomorrow. Will write note for out of work until  he has appointment with Ortho. - Ambulatory referral to Orthopedic Surgery   Signed, Olean Ree [place], [place], [facility] 04/01/2016 9:44 AM

## 2016-04-08 DIAGNOSIS — M25562 Pain in left knee: Secondary | ICD-10-CM | POA: Diagnosis not present

## 2016-04-24 ENCOUNTER — Other Ambulatory Visit: Payer: Self-pay | Admitting: Family Medicine

## 2016-04-24 MED ORDER — AMLODIPINE-OLMESARTAN 5-40 MG PO TABS: 1.0000 | ORAL_TABLET | Freq: Every day | ORAL | 1 refills | Status: DC

## 2016-04-24 NOTE — Telephone Encounter (Signed)
Medication refilled per protocol. 

## 2016-04-29 ENCOUNTER — Other Ambulatory Visit: Payer: Self-pay | Admitting: Family Medicine

## 2016-04-29 MED ORDER — FERROUS SULFATE 325 (65 FE) MG PO TABS: 325.0000 mg | ORAL_TABLET | Freq: Every day | ORAL | 3 refills | Status: DC

## 2016-04-29 NOTE — Telephone Encounter (Signed)
Medication refilled per protocol. 

## 2016-05-31 ENCOUNTER — Other Ambulatory Visit: Payer: Self-pay | Admitting: Physician Assistant

## 2016-05-31 NOTE — Telephone Encounter (Signed)
RX sent in for Uloric 40mg  tab

## 2016-06-26 ENCOUNTER — Other Ambulatory Visit: Payer: Self-pay | Admitting: Physician Assistant

## 2016-08-21 DIAGNOSIS — I129 Hypertensive chronic kidney disease with stage 1 through stage 4 chronic kidney disease, or unspecified chronic kidney disease: Secondary | ICD-10-CM | POA: Diagnosis not present

## 2016-08-21 DIAGNOSIS — N183 Chronic kidney disease, stage 3 (moderate): Secondary | ICD-10-CM | POA: Diagnosis not present

## 2016-08-21 DIAGNOSIS — M109 Gout, unspecified: Secondary | ICD-10-CM | POA: Diagnosis not present

## 2016-08-21 DIAGNOSIS — E785 Hyperlipidemia, unspecified: Secondary | ICD-10-CM | POA: Diagnosis not present

## 2016-10-08 ENCOUNTER — Other Ambulatory Visit: Payer: Self-pay | Admitting: Physician Assistant

## 2016-10-19 ENCOUNTER — Other Ambulatory Visit: Payer: Self-pay | Admitting: Physician Assistant

## 2016-10-21 NOTE — Telephone Encounter (Signed)
Rx filled per protocol  

## 2016-11-08 ENCOUNTER — Other Ambulatory Visit: Payer: Self-pay | Admitting: Physician Assistant

## 2017-01-11 ENCOUNTER — Other Ambulatory Visit: Payer: Self-pay | Admitting: Physician Assistant

## 2017-01-13 NOTE — Telephone Encounter (Signed)
Medication filled per protocol. Patient due for an office visit. Letter mailed to schedule an appointment

## 2017-01-30 ENCOUNTER — Encounter: Payer: Medicare Other | Admitting: Physician Assistant

## 2017-02-06 ENCOUNTER — Encounter: Payer: Self-pay | Admitting: Physician Assistant

## 2017-02-06 ENCOUNTER — Ambulatory Visit (INDEPENDENT_AMBULATORY_CARE_PROVIDER_SITE_OTHER): Payer: Medicare Other | Admitting: Physician Assistant

## 2017-02-06 VITALS — BP 132/74 | HR 57 | Temp 98.2°F | Resp 14 | Wt 168.0 lb

## 2017-02-06 DIAGNOSIS — M1000 Idiopathic gout, unspecified site: Secondary | ICD-10-CM | POA: Diagnosis not present

## 2017-02-06 DIAGNOSIS — Z0000 Encounter for general adult medical examination without abnormal findings: Secondary | ICD-10-CM | POA: Diagnosis not present

## 2017-02-06 DIAGNOSIS — N183 Chronic kidney disease, stage 3 unspecified: Secondary | ICD-10-CM

## 2017-02-06 DIAGNOSIS — I10 Essential (primary) hypertension: Secondary | ICD-10-CM | POA: Diagnosis not present

## 2017-02-06 DIAGNOSIS — E785 Hyperlipidemia, unspecified: Secondary | ICD-10-CM

## 2017-02-06 DIAGNOSIS — R739 Hyperglycemia, unspecified: Secondary | ICD-10-CM | POA: Diagnosis not present

## 2017-02-06 DIAGNOSIS — N529 Male erectile dysfunction, unspecified: Secondary | ICD-10-CM

## 2017-02-06 LAB — LIPID PANEL
CHOL/HDL RATIO: 3 ratio (ref <5.0)
Cholesterol: 163 mg/dL (ref <200)
HDL: 55 mg/dL (ref >40)
LDL Cholesterol: 95 mg/dL (ref <100)
TRIGLYCERIDES: 63 mg/dL (ref <150)
VLDL: 13 mg/dL (ref <30)

## 2017-02-06 LAB — COMPLETE METABOLIC PANEL WITH GFR
ALK PHOS: 95 U/L (ref 40–115)
ALT: 13 U/L (ref 9–46)
AST: 14 U/L (ref 10–35)
Albumin: 4.1 g/dL (ref 3.6–5.1)
BUN: 16 mg/dL (ref 7–25)
CO2: 26 mmol/L (ref 20–31)
Calcium: 9.2 mg/dL (ref 8.6–10.3)
Chloride: 108 mmol/L (ref 98–110)
Creat: 1.31 mg/dL — ABNORMAL HIGH (ref 0.70–1.11)
GFR, EST AFRICAN AMERICAN: 58 mL/min — AB (ref >=60)
GFR, EST NON AFRICAN AMERICAN: 50 mL/min — AB (ref >=60)
GLUCOSE: 104 mg/dL — AB (ref 70–99)
POTASSIUM: 4.5 mmol/L (ref 3.5–5.3)
Sodium: 143 mmol/L (ref 135–146)
TOTAL PROTEIN: 6.5 g/dL (ref 6.1–8.1)
Total Bilirubin: 0.7 mg/dL (ref 0.2–1.2)

## 2017-02-06 LAB — CBC WITH DIFFERENTIAL/PLATELET
BASOS PCT: 0 %
Basophils Absolute: 0 cells/uL (ref 0–200)
EOS ABS: 205 {cells}/uL (ref 15–500)
Eosinophils Relative: 5 %
HEMATOCRIT: 39 % (ref 38.5–50.0)
HEMOGLOBIN: 12.8 g/dL — AB (ref 13.0–17.0)
LYMPHS ABS: 1845 {cells}/uL (ref 850–3900)
LYMPHS PCT: 45 %
MCH: 27 pg (ref 27.0–33.0)
MCHC: 32.8 g/dL (ref 32.0–36.0)
MCV: 82.3 fL (ref 80.0–100.0)
MONO ABS: 410 {cells}/uL (ref 200–950)
MPV: 9.2 fL (ref 7.5–12.5)
Monocytes Relative: 10 %
Neutro Abs: 1640 cells/uL (ref 1500–7800)
Neutrophils Relative %: 40 %
Platelets: 186 10*3/uL (ref 140–400)
RBC: 4.74 MIL/uL (ref 4.20–5.80)
RDW: 14.2 % (ref 11.0–15.0)
WBC: 4.1 10*3/uL (ref 3.8–10.8)

## 2017-02-06 LAB — TSH: TSH: 3.82 mIU/L (ref 0.40–4.50)

## 2017-02-06 MED ORDER — SILDENAFIL CITRATE 100 MG PO TABS: 100.0000 mg | ORAL_TABLET | Freq: Every day | ORAL | 11 refills | Status: DC | PRN

## 2017-02-06 NOTE — Progress Notes (Signed)
Patient ID: John Hines MRN: [id number], DOB: [birth year] 83 y.o. Date of Encounter: 02/06/2017, 9:02 AM    Chief Complaint: Physical (CPE)  HPI: 83 y.o. y/o AA male here for CPE.   He has no complaints or concerns today. Says that he feels great. Says that he is still working, just like usual.  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 Says that he has always woken up at about 2:30 AM. Asked what time he goes to bed. Says sometimes 9 sometimes 10 sometimes 11. Says he likes going into work early like he does and then gets off by noon and then still has the whole day to go home and do what he wants. He mows the grass washes the cars and works in his garden. He lists off lots of vegetables that he is growing in his garden.  He continues to have no chest pressure or chest heaviness chest tightness with this exertion. Is noticing no increased shortness of breath. Is having no lower extremity edema.  He is taking his Lipitor as directed. This is causing no myalgias or other adverse effects. He is taking blood pressure medication as directed. Having no lightheadedness.  Review of Systems: Consitutional: No fever, chills, fatigue, night sweats, lymphadenopathy, or weight changes. Eyes: No visual changes, eye redness, or discharge. ENT/Mouth: Ears: No otalgia, tinnitus, hearing loss, discharge. Nose: No congestion, rhinorrhea, sinus pain, or epistaxis. Throat: No sore throat, post nasal drip, or teeth pain. Cardiovascular: No CP, palpitations, diaphoresis, DOE, edema, orthopnea, PND. Respiratory: No cough, hemoptysis, SOB, or wheezing. Gastrointestinal: No anorexia, dysphagia, reflux, pain, nausea, vomiting, hematemesis, diarrhea, constipation, BRBPR, or melena. Genitourinary: No dysuria, frequency, urgency, hematuria, incontinence,  nocturia, decreased urinary stream, discharge, impotence, or testicular pain/masses. Musculoskeletal: No decreased ROM, myalgias, stiffness, joint swelling, or weakness. Skin: No rash, erythema, lesion changes, pain, warmth, jaundice, or pruritis. Neurological: No headache, dizziness, syncope, seizures, tremors, memory loss, coordination problems, or paresthesias. Psychological: No anxiety, depression, hallucinations, SI/HI. Endocrine: No fatigue, polydipsia, polyphagia, polyuria, or known diabetes. All other systems were reviewed and are otherwise negative.  Past Medical History:  Diagnosis Date  . Arthritis   . BPH (benign prostatic hyperplasia)   . CHF (congestive heart failure) ([place])    Nonischemic Cardiomyopathy. EF 25-35%  . Chronic kidney disease 05/20/2013   CKD 3  . Colon polyps   . Dyslipidemia   . Dysrhythmia 08/19/11   "low heart beat; maybe 25bpm; got RX; now 60bpm"  . ED (erectile dysfunction)   . Gout   . Heart murmur   . Hyperlipidemia   . Hypertension   . Low back pain 08/12/2013  . Nonfunctioning kidney Recognized 03/2006   Chronically and Severely Hydronephrotic and Nonfunctioning Right Kidney  . Solitary kidney Recognized 03/2006   Solitary Functioning Left Kidney  . Stroke [facility])    "mild stroke; dx'd 2010; don't know when it happened"  . Syncope and collapse 08/19/11   "shallow breathing; sweating horribly"     Past Surgical History:  Procedure Laterality Date  . CARDIOVASCULAR STRESS TEST  08/30/2010   Small to moderate fixed inferoseptal defect. Severe global hypokinesis with inferior akinesis and septal dyskinesis-likely secondary to a prominent LBBB. Non-diagnostic for ischemia due to persantine.  Marland Kitchen KNEE ARTHROSCOPY  1996   right  . TRANSTHORACIC ECHOCARDIOGRAM  08/20/2011  EF 45-50%, severe concentric hyperthrophy.    Home Meds:  Outpatient Medications Prior to Visit  Medication Sig Dispense Refill  . amLODipine-olmesartan (AZOR) 5-40 MG tablet  TAKE 1 TABLET BY MOUTH DAILY. 90 tablet 1  . aspirin EC 81 MG tablet Take 81 mg by mouth daily.      Marland Kitchen atorvastatin (LIPITOR) 80 MG tablet TAKE 1 TABLET (80 MG TOTAL) BY MOUTH DAILY. 30 tablet 0  . carvedilol (COREG) 12.5 MG tablet TAKE 1 TABLET (12.5 MG TOTAL) BY MOUTH 2 (TWO) TIMES DAILY WITH A MEAL. 180 tablet 1  . ferrous sulfate 325 (65 FE) MG tablet Take 1 tablet (325 mg total) by mouth daily. 90 tablet 3  . sildenafil (VIAGRA) 100 MG tablet Take 1 tablet (100 mg total) by mouth daily as needed for erectile dysfunction. 6 tablet 11  . ULORIC 40 MG tablet TAKE 1 TABLET (40 MG TOTAL) BY MOUTH DAILY. 30 tablet 0  . tamsulosin (FLOMAX) 0.4 MG CAPS capsule Take 1 capsule (0.4 mg total) by mouth daily. 30 capsule 11   No facility-administered medications prior to visit.     Allergies:  Allergies  Allergen Reactions  . Penicillins Swelling    Social History   Social History  . Marital status: Married    Spouse name: N/A  . Number of children: N/A  . Years of education: N/A   Occupational History  . Not on file.   Social History Main Topics  . Smoking status: Never Smoker  . Smokeless tobacco: Never Used  . Alcohol use Yes     Comment: "social drinker; last time 08/15/11"  . Drug use: No  . Sexual activity: Yes   Other Topics Concern  . Not on file   Social History Narrative  . No narrative on file    Family History  Problem Relation Age of Onset  . Cancer Father     Physical Exam: Blood pressure 132/74, pulse (!) 57, temperature 98.2 F (36.8 C), temperature source Oral, resp. rate 14, weight 168 lb (76.2 kg), SpO2 98 %.  General: Well developed, well nourished AAM. Appears in no acute distress. HEENT: Normocephalic, atraumatic. Conjunctiva pink, sclera non-icteric. Pupils 2 mm constricting to 1 mm, round, regular, and equally reactive to light and accomodation. EOMI. Internal auditory canal clear. TMs with good cone of light and without pathology. Nasal mucosa  pink. Nares are without discharge. No sinus tenderness. Oral mucosa pink. Pharynx without exudate.   Neck: Supple. Trachea midline. No thyromegaly. Full ROM. No lymphadenopathy. No carotid bruit. Lungs: Clear to auscultation bilaterally without wheezes, rales, or rhonchi. Breathing is of normal effort and unlabored. Cardiovascular: RRR with S1 S2. No murmurs, rubs, or gallops. Distal pulses 2+ symmetrically. No carotid or abdominal bruits. Abdomen: Soft, non-tender, non-distended with normoactive bowel sounds. No hepatosplenomegaly or masses. No rebound/guarding. No CVA tenderness. No hernias. Musculoskeletal: Full range of motion and 5/5 strength throughout. Skin: Warm and moist without erythema, ecchymosis, wounds, or rash. Neuro: A+Ox3. CN II-XII grossly intact. Moves all extremities spontaneously. Full sensation throughout. Normal gait. DTR 2+ throughout upper and lower extremities.  Psych:  Responds to questions appropriately with a normal affect.   Assessment/Plan:  83 y.o. y/o AA male here for CPE  -1. Visit for preventive health examination  A. Screening Labs: He is fasting. Check labs now.  B. Screening For Prostate Cancer: PSA normal at last check. Will not recheck now given age 45.   C. Screening For Colorectal Cancer:  Given age  85, does not need further screening  D. Immunizations: Flu--------------------------- N/A Tetanus------------------- TD 09/23/2005----not updating now---not covered by Medicare Pneumococcal------------ he received Pneumovax 23-- 11/22/11. Received Prevnar 13 on 09/22/13 Zostavax/Shingrix------------------- needs to find out coverage/cost and follow-up regarding this   2. Medicare annual wellness visit, subsequent This is documented below.  3. Hyperlipidemia He is fasting. Check labs now. He is on Pravastatin  4. Essential hypertension Blood pressure at goal. Continue current medications. Check labs to monitor.   Hyperglycemia Recheck A1C to  monitor  Chronic systolic heart failure ([place]) He remains clinically stable regarding this. I reviewed with him today that his last visit with Dr. Claiborne Billings was February 2015 and he was supposed to be following up with him annually.  I did review Dr. Evette Georges LOV note. At that time, pt was on appropriate medications. Pt is clinically stable and asymptomatic.   Gout without tophus, unspecified cause, unspecified chronicity, unspecified site He states that he has had no gout flares.  Solitary kidney He has been evaluated by Dr. Lorrene Reid at [facility].He sees her annually. Check labs now to monitor.   Chronic kidney disease, stage 1 He has been evaluated by Dr. Lorrene Reid at [facility] He sees her annually. Check labs now to monitor.    Erectile dysfunction, unspecified erectile dysfunction type - sildenafil (VIAGRA) 100 MG tablet; Take 1 tablet (100 mg total) by mouth daily as needed for erectile dysfunction.  Dispense: 6 tablet; Refill: 11    Subjective:   Patient presents for Medicare Annual/Subsequent preventive examination.   Review Past Medical/Family/Social: This is all reviewed today. Documented above and in epic tabs.  Risk Factors  Current exercise habits: He continues to work. Dietary issues discussed:  He eats a low-sodium low-cholesterol low carbohydrate diet.  Cardiac risk factors: TIA, Age, Male, HTN, HLD  Depression Screen  (Note: if answer to either of the following is "Yes", a more complete depression screening is indicated)  Over the past two weeks, have you felt down, depressed or hopeless? No Over the past two weeks, have you felt little interest or pleasure in doing things? No Have you lost interest or pleasure in daily life? No Do you often feel hopeless? No Do you cry easily over simple problems? No   Activities of Daily Living  In your present state of health, do you have any difficulty performing the following activities?:    Driving? No  Managing money? No  Feeding yourself? No  Getting from bed to chair? No  Climbing a flight of stairs? No  Preparing food and eating?: No  Bathing or showering? No  Getting dressed: No  Getting to the toilet? No  Using the toilet:No  Moving around from place to place: No  In the past year have you fallen or had a near fall?:No  Are you sexually active? No  Do you have more than one partner? No   Hearing Difficulties: No  Do you often ask people to speak up or repeat themselves? No  Do you experience ringing or noises in your ears? No Do you have difficulty understanding soft or whispered voices? No  Do you feel that you have a problem with memory? No Do you often misplace items? No  Do you feel safe at home? Yes  Cognitive Testing  Alert? Yes Normal Appearance?Yes  Oriented to person? Yes Place? Yes  Time? Yes  Recall of three objects? Yes  Can perform simple calculations? Yes  Displays appropriate judgment?Yes  Can read  the correct time from a watch face?Yes   List the Names of Other Physician/Practitioners you currently use:  Dr. Shelva Majestic cardiology Dr. Jamal Maes [facility]  Indicate any recent Medical Services you may have received from other than [facility] providers in the past year (date may be approximate).  Dr. Jamal Maes-- [facility]  Screening Tests / Date--- all of this information is documented above. See above. Colonoscopy                     Zostavax  Mammogram  Influenza Vaccine  Tetanus/tdap    Assessment:    Annual wellness medicare exam   Plan:    During the course of the visit the patient was educated and counseled about appropriate screening and preventive services including:  Screening mammography  Colorectal cancer screening  Shingles vaccine. Prescription given to that she can get the vaccine at the pharmacy or Medicare part D.  Screen + for depression. PHQ- 9 score of 12 (moderate  depression). We discussed the options of counseling versus possibly a medication. I encouraged her strongly think about the counseling. She is going through some medical problems currently and her husband is as well Mrs. been very stressful for her. She says she will think about it. She does have Xanax to use as needed. Though she may benefit from an SSRI for her more depressive type symptoms but she wants to hold off at this time.  I aksed her to please have her cardioloist send records since we have none on file.  Diet review for nutrition referral? Yes ____ Not Indicated __x__  Patient Instructions (the written plan) was given to the patient.  Medicare Attestation  I have personally reviewed:  The patient's medical and social history  Their use of alcohol, tobacco or illicit drugs  Their current medications and supplements  The patient's functional ability including ADLs,fall risks, home safety risks, cognitive, and hearing and visual impairment  Diet and physical activities  Evidence for depression or mood disorders  The patient's weight, height, BMI, and visual acuity have been recorded in the chart. I have made referrals, counseling, and provided education to the patient based on review of the above and I have provided the patient with a written personalized care plan for preventive services.        Signed:   [address] [place], [place]  02/06/2017 9:02 AM

## 2017-02-07 LAB — URIC ACID: Uric Acid, Serum: 4.1 mg/dL (ref 4.0–8.0)

## 2017-02-07 LAB — HEMOGLOBIN A1C
HEMOGLOBIN A1C: 6.7 % — AB (ref <5.7)
Mean Plasma Glucose: 146 mg/dL

## 2017-02-14 ENCOUNTER — Telehealth: Payer: Self-pay | Admitting: Family Medicine

## 2017-02-14 NOTE — Telephone Encounter (Signed)
Pt made aware of recent lab work results.  Everything stable no medication changes.

## 2017-03-01 ENCOUNTER — Other Ambulatory Visit: Payer: Self-pay | Admitting: Physician Assistant

## 2017-03-03 NOTE — Telephone Encounter (Signed)
Refill appropriate 

## 2017-03-05 ENCOUNTER — Other Ambulatory Visit: Payer: Self-pay | Admitting: Physician Assistant

## 2017-04-02 ENCOUNTER — Other Ambulatory Visit: Payer: Self-pay | Admitting: Family Medicine

## 2017-05-13 ENCOUNTER — Other Ambulatory Visit: Payer: Self-pay | Admitting: Physician Assistant

## 2017-05-13 NOTE — Telephone Encounter (Signed)
Refill approrpriate

## 2017-05-21 ENCOUNTER — Other Ambulatory Visit: Payer: Self-pay | Admitting: Physician Assistant

## 2017-05-21 NOTE — Telephone Encounter (Signed)
Refill appropriate 

## 2017-05-28 ENCOUNTER — Other Ambulatory Visit: Payer: Self-pay | Admitting: Physician Assistant

## 2017-06-01 ENCOUNTER — Other Ambulatory Visit: Payer: Self-pay | Admitting: Physician Assistant

## 2017-06-22 ENCOUNTER — Other Ambulatory Visit: Payer: Self-pay | Admitting: Physician Assistant

## 2017-06-23 NOTE — Telephone Encounter (Signed)
Refill appropriate and filled per protocol. 

## 2017-07-22 ENCOUNTER — Other Ambulatory Visit: Payer: Self-pay

## 2017-07-22 MED ORDER — FEBUXOSTAT 40 MG PO TABS: 40.0000 mg | ORAL_TABLET | Freq: Every day | ORAL | 1 refills | Status: DC

## 2017-07-22 NOTE — Telephone Encounter (Signed)
Refill appropriate 

## 2017-08-07 ENCOUNTER — Other Ambulatory Visit: Payer: Self-pay

## 2017-08-07 ENCOUNTER — Ambulatory Visit (INDEPENDENT_AMBULATORY_CARE_PROVIDER_SITE_OTHER): Payer: Medicare Other | Admitting: Physician Assistant

## 2017-08-07 ENCOUNTER — Encounter: Payer: Self-pay | Admitting: Physician Assistant

## 2017-08-07 VITALS — BP 138/84 | HR 69 | Temp 97.8°F | Resp 16 | Wt 169.4 lb

## 2017-08-07 DIAGNOSIS — N289 Disorder of kidney and ureter, unspecified: Secondary | ICD-10-CM | POA: Diagnosis not present

## 2017-08-07 DIAGNOSIS — E785 Hyperlipidemia, unspecified: Secondary | ICD-10-CM

## 2017-08-07 DIAGNOSIS — I10 Essential (primary) hypertension: Secondary | ICD-10-CM | POA: Diagnosis not present

## 2017-08-07 DIAGNOSIS — N400 Enlarged prostate without lower urinary tract symptoms: Secondary | ICD-10-CM | POA: Diagnosis not present

## 2017-08-07 DIAGNOSIS — M1000 Idiopathic gout, unspecified site: Secondary | ICD-10-CM | POA: Diagnosis not present

## 2017-08-07 DIAGNOSIS — N183 Chronic kidney disease, stage 3 unspecified: Secondary | ICD-10-CM

## 2017-08-07 DIAGNOSIS — D509 Iron deficiency anemia, unspecified: Secondary | ICD-10-CM

## 2017-08-07 DIAGNOSIS — R739 Hyperglycemia, unspecified: Secondary | ICD-10-CM

## 2017-08-07 DIAGNOSIS — I5022 Chronic systolic (congestive) heart failure: Secondary | ICD-10-CM

## 2017-08-07 DIAGNOSIS — Z23 Encounter for immunization: Secondary | ICD-10-CM | POA: Diagnosis not present

## 2017-08-07 DIAGNOSIS — K635 Polyp of colon: Secondary | ICD-10-CM | POA: Diagnosis not present

## 2017-08-07 NOTE — Addendum Note (Signed)
Addended by: Vonna Kotyk A on: 08/07/2017 03:39 PM   Modules accepted: Orders

## 2017-08-07 NOTE — Progress Notes (Addendum)
Patient ID: John Hines MRN: [id number], DOB: [birth year], 83 y.o. Date of Encounter: @DATE @  Chief Complaint:  Chief Complaint  Patient presents with  . 6 month follow up    HPI: 83 y.o. year old male  presents with   He has no complaints or concerns today. Says that he feels great. Says that he is still working, just like usual.  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 Says that he has always woken up at about 2:30 AM. Asked what time he goes to bed. Says sometimes 9 sometimes 10 sometimes 11. Says he likes going into work early like he does and then gets off by noon and then still has the whole day to go home and do what he wants.  During the summer---He mows the grass washes the cars and works in his garden.  He continues to have no chest pressure or chest heaviness chest tightness with this exertion. Is noticing no increased shortness of breath. Is having no lower extremity edema.   He is taking his Lipitor as directed. This is causing no myalgias or other adverse effects. He is taking blood pressure medication as directed. Having no lightheadedness.  He has had no gout flares since last visit.  He had been seeing Dr. Lorrene Reid at [facility] on a routine basis but today he says that he thinks it has been about 2 years since his last visit with her.  Today I encouraged him to schedule follow-up and he says that he will go home and schedule this.  He also had been seeing Dr. Claiborne Billings at cardiology in the past but today he states that he thinks his last visit with him was about 3 years ago.  Also encouraged him to go ahead and schedule follow-up with him even though he is asymptomatic at this time.   ADDENDUM added 12/29/2017: Received note from Dr. Lorrene Reid at [facility].   Her note was dated 01/2018.   He presented there for routine follow-up.   Impression was "has had remarkably stable kidney function in his solitary kidney.  I think still okay for once a year follow-up."    Past Medical History:  Diagnosis Date  . Arthritis   . BPH (benign prostatic hyperplasia)   . CHF (congestive heart failure) ([place])    Nonischemic Cardiomyopathy. EF 25-35%  . Chronic kidney disease 05/20/2013   CKD 3  . Colon polyps   . Dyslipidemia   . Dysrhythmia 08/19/11   "low heart beat; maybe 25bpm; got RX; now 60bpm"  . ED (erectile dysfunction)   . Gout   . Heart murmur   . Hyperlipidemia   . Hypertension   . Low back pain 08/12/2013  . Nonfunctioning kidney Recognized 03/2006   Chronically and Severely Hydronephrotic and Nonfunctioning Right Kidney  . Solitary kidney Recognized 03/2006   Solitary Functioning Left Kidney  . Stroke [facility])    "mild stroke; dx'd 2010; don't know when it happened"  . Syncope and collapse 08/19/11   "shallow breathing; sweating horribly"     Home Meds: Outpatient Medications Prior to Visit  Medication Sig Dispense Refill  . amLODipine-olmesartan (AZOR) 5-40 MG tablet TAKE 1 TABLET BY MOUTH DAILY. 90 tablet 1  . aspirin EC 81 MG tablet Take 81 mg by mouth daily.      Marland Kitchen  atorvastatin (LIPITOR) 80 MG tablet TAKE 1 TABLET BY MOUTH EVERY DAY 90 tablet 0  . carvedilol (COREG) 12.5 MG tablet TAKE 1 TABLET (12.5 MG TOTAL) BY MOUTH 2 (TWO) TIMES DAILY WITH A MEAL. 180 tablet 0  . febuxostat (ULORIC) 40 MG tablet Take 1 tablet (40 mg total) by mouth daily. 90 tablet 1  . ferrous sulfate 325 (65 FE) MG tablet TAKE 1 TABLET (325 MG TOTAL) BY MOUTH DAILY. 90 tablet 2  . sildenafil (VIAGRA) 100 MG tablet Take 1 tablet (100 mg total) by mouth daily as needed for erectile dysfunction. 6 tablet 11   No facility-administered medications prior to visit.     Allergies:  Allergies  Allergen Reactions  . Penicillins Swelling    Social History   Socioeconomic History  . Marital  status: Married    Spouse name: Not on file  . Number of children: Not on file  . Years of education: Not on file  . Highest education level: Not on file  Social Needs  . Financial resource strain: Not on file  . Food insecurity - worry: Not on file  . Food insecurity - inability: Not on file  . Transportation needs - medical: Not on file  . Transportation needs - non-medical: Not on file  Occupational History  . Not on file  Tobacco Use  . Smoking status: Never Smoker  . Smokeless tobacco: Never Used  Substance and Sexual Activity  . Alcohol use: Yes    Comment: "social drinker; last time 08/15/11"  . Drug use: No  . Sexual activity: Yes  Other Topics Concern  . Not on file  Social History Narrative  . Not on file    Family History  Problem Relation Age of Onset  . Cancer Father      Review of Systems:  See HPI for pertinent ROS. All other ROS negative.    Physical Exam: Blood pressure 138/84, pulse 69, temperature 97.8 F (36.6 C), temperature source Oral, resp. rate 16, weight 76.8 kg (169 lb 6.4 oz), SpO2 98 %., There is no height or weight on file to calculate BMI. General: WNWD AAM. Appears in no acute distress. Neck: Supple. No thyromegaly. No lymphadenopathy. No carotid bruit. Lungs: Clear bilaterally to auscultation without wheezes, rales, or rhonchi. Breathing is unlabored. Heart: RRR with S1 S2. No murmurs, rubs, or gallops. Abdomen: Soft, non-tender, non-distended with normoactive bowel sounds. No hepatomegaly. No rebound/guarding. No obvious abdominal masses. Musculoskeletal:  Strength and tone normal for age. Extremities/Skin: Warm and dry. No LE edema.  Neuro: Alert and oriented X 3. Moves all extremities spontaneously. Gait is normal. CNII-XII grossly in tact. Psych:  Responds to questions appropriately with a normal affect.     ASSESSMENT AND PLAN:  83 y.o. year old male with   Hyperlipidemia He is on statin. Recheck labs now to monitor.    Essential hypertension Blood pressure at goal. Check labs to monitor. Continue current medications.   Hyperglycemia This has remained stable. Recheck A1C now  Chronic systolic heart failure ([place]) He remains clinically stable regarding this. At [place] 08/2015---" However I reviewed with him today that his last visit with Dr. Claiborne Billings was February 2015 and he was supposed to be following up with him annually. Patient promises to go home and call and schedule follow-up appointment with cardiology to day." At [place] 02/2016-- he says that he missed his appointment with Dr. Claiborne Billings 08/07/2017: Reports that he has not seen Dr. Claiborne Billings in several years.  Told  him to go ahead and follow-up with him.  Gout without tophus, unspecified cause, unspecified chronicity, unspecified site He states that he has had no gout flares. Have not checked a uric acid level since 08/2013.  Recheck uric acid level now-- 02/2016 08/07/17: He reports no recent gout flares.  He is on [place].  Recheck lab to monitor.  Solitary kidney He has been evaluated by Dr. Lorrene Reid at [facility]. F/U labs now. He sees Dr. Lorrene Reid once a year 08/07/17: Check lab to monitor now.  Also told him to call and schedule follow-up visit with Dr. Lorrene Reid and he is agreeable to do so. ADDENDUM added 12/29/2017: Received note from Dr. Lorrene Reid at [facility].   Her note was dated 01/2018.  He presented there for routine follow-up.   Impression was "has had remarkably stable kidney function in his solitary kidney.  I think still okay for once a year follow-up."   Erectile dysfunction, unspecified erectile dysfunction type Continue Viagra as needed.  Chronic kidney disease, stage III (moderate) (HCC) He has been evaluated by Dr. Lorrene Reid at [facility]. F/U labs now. He sees Dr. Lorrene Reid once a year 08/07/17: Check lab to monitor now.  Also told him to call and schedule follow-up visit with Dr. Lorrene Reid and he is  agreeable to do so. - CBC with Differential/Platelet - COMPLETE METABOLIC PANEL WITH GFR ADDENDUM added 12/29/2017: Received note from Dr. Lorrene Reid at [facility].   Her note was dated 01/2018.  He presented there for routine follow-up.   Impression was "has had remarkably stable kidney function in his solitary kidney.  I think still okay for once a year follow-up."  Nonfunctioning kidney - COMPLETE METABOLIC PANEL WITH GFR ADDENDUM added 12/29/2017: Received note from Dr. Lorrene Reid at [facility].   Her note was dated 01/2018.  He presented there for routine follow-up.   Impression was "has had remarkably stable kidney function in his solitary kidney.  I think still okay for once a year follow-up."  Iron deficiency anemia, unspecified iron deficiency anemia type Is on iron supplement.  Check lab to monitor. - CBC with Differential/Platelet  We will give flu vaccine today.  Will check labs.  Will plan for follow-up office visit 6 months, sooner if needed.  Signed, [address] [place], [place], [facility] 08/07/2017 8:05 AM

## 2017-08-08 LAB — COMPLETE METABOLIC PANEL WITH GFR
AG RATIO: 1.5 (calc) (ref 1.0–2.5)
ALT: 11 U/L (ref 9–46)
AST: 13 U/L (ref 10–35)
Albumin: 4 g/dL (ref 3.6–5.1)
Alkaline phosphatase (APISO): 102 U/L (ref 40–115)
BILIRUBIN TOTAL: 0.8 mg/dL (ref 0.2–1.2)
BUN/Creatinine Ratio: 11 (calc) (ref 6–22)
BUN: 14 mg/dL (ref 7–25)
CALCIUM: 9 mg/dL (ref 8.6–10.3)
CHLORIDE: 107 mmol/L (ref 98–110)
CO2: 28 mmol/L (ref 20–32)
Creat: 1.23 mg/dL — ABNORMAL HIGH (ref 0.70–1.11)
GFR, EST NON AFRICAN AMERICAN: 54 mL/min/{1.73_m2} — AB (ref >=60)
GFR, Est African American: 63 mL/min/{1.73_m2} (ref >=60)
GLOBULIN: 2.6 g/dL (ref 1.9–3.7)
Glucose, Bld: 108 mg/dL — ABNORMAL HIGH (ref 65–99)
POTASSIUM: 4.2 mmol/L (ref 3.5–5.3)
SODIUM: 142 mmol/L (ref 135–146)
Total Protein: 6.6 g/dL (ref 6.1–8.1)

## 2017-08-08 LAB — HEMOGLOBIN A1C
HEMOGLOBIN A1C: 6.8 %{Hb} — AB (ref <5.7)
MEAN PLASMA GLUCOSE: 148 (calc)
eAG (mmol/L): 8.2 (calc)

## 2017-08-08 LAB — CBC WITH DIFFERENTIAL/PLATELET
Basophils Absolute: 22 cells/uL (ref 0–200)
Basophils Relative: 0.5 %
Eosinophils Absolute: 163 cells/uL (ref 15–500)
Eosinophils Relative: 3.8 %
HEMATOCRIT: 38 % — AB (ref 38.5–50.0)
Hemoglobin: 13.1 g/dL — ABNORMAL LOW (ref 13.2–17.1)
LYMPHS ABS: 1699 {cells}/uL (ref 850–3900)
MCH: 27.8 pg (ref 27.0–33.0)
MCHC: 34.5 g/dL (ref 32.0–36.0)
MCV: 80.5 fL (ref 80.0–100.0)
MPV: 9.9 fL (ref 7.5–12.5)
Monocytes Relative: 11.8 %
NEUTROS PCT: 44.4 %
Neutro Abs: 1909 cells/uL (ref 1500–7800)
Platelets: 215 10*3/uL (ref 140–400)
RBC: 4.72 10*6/uL (ref 4.20–5.80)
RDW: 13 % (ref 11.0–15.0)
Total Lymphocyte: 39.5 %
WBC: 4.3 10*3/uL (ref 3.8–10.8)
WBCMIX: 507 {cells}/uL (ref 200–950)

## 2017-08-08 LAB — LIPID PANEL
CHOL/HDL RATIO: 2.7 (calc) (ref <5.0)
CHOLESTEROL: 133 mg/dL (ref <200)
HDL: 49 mg/dL (ref >40)
LDL Cholesterol (Calc): 70 mg/dL (calc)
NON-HDL CHOLESTEROL (CALC): 84 mg/dL (ref <130)
Triglycerides: 49 mg/dL (ref <150)

## 2017-08-08 LAB — URIC ACID: Uric Acid, Serum: 6.6 mg/dL (ref 4.0–8.0)

## 2017-08-12 ENCOUNTER — Other Ambulatory Visit: Payer: Self-pay

## 2017-08-12 MED ORDER — SITAGLIPTIN PHOSPHATE 50 MG PO TABS: 50.0000 mg | ORAL_TABLET | Freq: Every day | ORAL | 0 refills | Status: DC

## 2017-08-12 NOTE — Progress Notes (Signed)
erro  neous encounter

## 2017-08-13 ENCOUNTER — Telehealth: Payer: Self-pay

## 2017-08-13 NOTE — Telephone Encounter (Signed)
Patient called and stated when he went to pick up his Januvia it was going to be $400. Patient is requesting another rx that will be cheaper

## 2017-08-18 NOTE — Telephone Encounter (Signed)
Have pharmacy find out what other medicines in that class are covered by his insurance.

## 2017-08-19 NOTE — Telephone Encounter (Signed)
Spoke with Maudie Mercury with patient  insurance company the [place] is a tier 3 and the only other alternative is glyxambi.

## 2017-08-20 NOTE — Telephone Encounter (Signed)
Spoke with patient about provider recommendations. Januvia has been removed from med list

## 2017-08-20 NOTE — Telephone Encounter (Signed)
A1c is not that high.  Will simply not add any medication at this time.  Remove Januvia off of medication list.  Tell patient to be extra careful and with his diet and decrease carbohydrates----avoid any type of sweets as far as cakes pies etc. and decrease bread pasta potatoes.

## 2017-08-20 NOTE — Telephone Encounter (Signed)
Spoke with patient daughter I can not provide any information to her. Left message for patient to return my call

## 2017-08-20 NOTE — Telephone Encounter (Signed)
Pts daughter called asking questions about med again, please call back this number [phone]

## 2017-08-25 ENCOUNTER — Other Ambulatory Visit: Payer: Self-pay

## 2017-08-25 MED ORDER — CARVEDILOL 12.5 MG PO TABS: ORAL_TABLET | ORAL | 0 refills | Status: DC

## 2017-08-25 NOTE — Telephone Encounter (Signed)
rx filled per protocol  

## 2017-09-03 ENCOUNTER — Other Ambulatory Visit: Payer: Self-pay

## 2017-09-03 ENCOUNTER — Encounter: Payer: Self-pay | Admitting: Physician Assistant

## 2017-09-03 ENCOUNTER — Ambulatory Visit (INDEPENDENT_AMBULATORY_CARE_PROVIDER_SITE_OTHER): Payer: Medicare Other | Admitting: Physician Assistant

## 2017-09-03 VITALS — BP 136/74 | HR 79 | Temp 98.0°F | Resp 14 | Wt 170.2 lb

## 2017-09-03 DIAGNOSIS — M25562 Pain in left knee: Secondary | ICD-10-CM

## 2017-09-03 DIAGNOSIS — M545 Low back pain, unspecified: Secondary | ICD-10-CM

## 2017-09-03 NOTE — Progress Notes (Addendum)
Patient ID: John Hines MRN: [id number], DOB: 06/27/1932, 83 y.o. Date of Encounter: 09/03/2017, 2:59 PM    Chief Complaint:  Chief Complaint  Patient presents with  . fell in [employer]    back pain, left leg pain      HPI: 83 y.o. year old male presents with above.   He reports that he stepped in some water in [place] ---on his way out of [place].  Slipped and fell.  Says that he "did kind of a split"--his right leg went forward and his left leg kind of went back but was bent at the knee.  This is happened on Monday at about 5:30 PM--- 09/01/2017.  At first, it initially "scared him--he was afraid he had broken a bone".  But, says that once people helped him get up, then he was okay. The rest of that night, he felt fine.  On Tuesday morning--- yesterday morning--- he felt some discomfort from the left knee down the left lower leg.  He applied ice to the left knee and that all got better.  This morning every time he would step up onto his little ladder, he would feel pain in both sides of his low back. 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  States that his left knee and left lower leg has not bothered him anymore since Tuesday morning.  Has felt no further pain there.  The knee has not locked up or given way.  At times feels some mild achiness in both sides of his low back.  No pain no numbness no tingling no weakness down either leg.  No incontinence of bowel or bladder.  No saddle anesthesia.  Has been using no medication to treat this. Has had no other areas of pain or concern.   ADDENDUM ADDED 10/13/2017: Received OV note from [facility] dated 09/30/2017---they did injection to left knee and left lumbar trigger point/ SI region     Home Meds:   Outpatient Medications Prior to Visit  Medication Sig  Dispense Refill  . amLODipine-olmesartan (AZOR) 5-40 MG tablet TAKE 1 TABLET BY MOUTH DAILY. 90 tablet 1  . aspirin EC 81 MG tablet Take 81 mg by mouth daily.      Marland Kitchen atorvastatin (LIPITOR) 80 MG tablet TAKE 1 TABLET BY MOUTH EVERY DAY 90 tablet 0  . carvedilol (COREG) 12.5 MG tablet TAKE 1 TABLET (12.5 MG TOTAL) BY MOUTH 2 (TWO) TIMES DAILY WITH A MEAL. 180 tablet 0  . febuxostat (ULORIC) 40 MG tablet Take 1 tablet (40 mg total) by mouth daily. 90 tablet 1  . ferrous sulfate 325 (65 FE) MG tablet TAKE 1 TABLET (325 MG TOTAL) BY MOUTH DAILY. 90 tablet 2  . sildenafil (VIAGRA) 100 MG tablet Take 1 tablet (100 mg total) by mouth daily as needed for erectile dysfunction. 6 tablet 11   No facility-administered medications prior to visit.     Allergies:  Allergies  Allergen Reactions  . Penicillins Swelling      Review of Systems: See HPI for pertinent ROS. All other ROS negative.    Physical Exam: Blood pressure 136/74, pulse 79, temperature 98 F (36.7 C), temperature source Oral, resp. rate 14, weight 77.2 kg (170 lb 3.2 oz), SpO2 98 %., Body mass index is 26.66 kg/m. General:  WNWD AAM. Vita Barley  in no acute distress. Neck: Supple. No thyromegaly. No lymphadenopathy. Lungs: Clear bilaterally to auscultation without wheezes, rales, or rhonchi. Breathing is unlabored. Heart: Regular rhythm. No murmurs, rubs, or gallops. Msk:  Strength and tone normal for age. Bilateral Low Back:  --Inspection: Normal. No ecchymosis, no abrasion.   --He has mild tenderness with palpation of bilateral low back about L5 level. Left Knee: --Inspection: Normal.  No ecchymosis, no abrasion or laceration.  No effusion or swelling. --Palpation: There is some tenderness with palpation along the medial joint line.  No other area of tenderness. Extremities/Skin: Warm and dry.  Neuro: Alert and oriented X 3. Moves all extremities spontaneously. Gait is normal. CNII-XII grossly in tact. Psych:  Responds to  questions appropriately with a normal affect.     ASSESSMENT AND PLAN:  83 y.o. year old male with  1. Acute pain of left knee At this time the pain in his left knee has resolved.  Told him to follow-up with me if develops significant pain in the knee or if he notices any locking up or giving way of the knee. ADDENDUM ADDED 10/13/2017: Received OV note from [place] dated 09/30/2017---they did injection to left knee and left lumbar trigger point/ SI region   2. Acute bilateral low back pain without sciatica Recommend that he apply heat to the area using a heating pad.  Also recommend slowly gently stretching low back. Discussed to avoid NSAIDs, given his CKD.  Discussed that if he needs any medication for pain, to use Tylenol.  If symptoms worsen beyond that, follow-up. ADDENDUM ADDED 10/13/2017: Received OV note from [facility] dated 09/30/2017---they did injection to left knee and left lumbar trigger point/ SI region    Signed, [facility] [place], [place], [facility] 09/03/2017 2:59 PM

## 2017-09-05 ENCOUNTER — Other Ambulatory Visit: Payer: Self-pay

## 2017-09-05 MED ORDER — ATORVASTATIN CALCIUM 80 MG PO TABS: 80.0000 mg | ORAL_TABLET | Freq: Every day | ORAL | 0 refills | Status: DC

## 2017-09-09 DIAGNOSIS — M1712 Unilateral primary osteoarthritis, left knee: Secondary | ICD-10-CM | POA: Diagnosis not present

## 2017-09-09 DIAGNOSIS — M25521 Pain in right elbow: Secondary | ICD-10-CM | POA: Diagnosis not present

## 2017-09-09 DIAGNOSIS — M25562 Pain in left knee: Secondary | ICD-10-CM | POA: Diagnosis not present

## 2017-09-09 DIAGNOSIS — M545 Low back pain: Secondary | ICD-10-CM | POA: Diagnosis not present

## 2017-09-30 DIAGNOSIS — M1712 Unilateral primary osteoarthritis, left knee: Secondary | ICD-10-CM | POA: Diagnosis not present

## 2017-09-30 DIAGNOSIS — M545 Low back pain: Secondary | ICD-10-CM | POA: Diagnosis not present

## 2017-09-30 DIAGNOSIS — M533 Sacrococcygeal disorders, not elsewhere classified: Secondary | ICD-10-CM | POA: Diagnosis not present

## 2017-11-12 ENCOUNTER — Ambulatory Visit: Payer: Self-pay | Admitting: Physician Assistant

## 2017-12-01 ENCOUNTER — Other Ambulatory Visit: Payer: Self-pay | Admitting: Physician Assistant

## 2017-12-01 MED ORDER — CARVEDILOL 12.5 MG PO TABS: ORAL_TABLET | ORAL | 0 refills | Status: DC

## 2017-12-16 ENCOUNTER — Other Ambulatory Visit: Payer: Self-pay

## 2017-12-16 MED ORDER — ATORVASTATIN CALCIUM 80 MG PO TABS: 80.0000 mg | ORAL_TABLET | Freq: Every day | ORAL | 0 refills | Status: DC

## 2017-12-22 DIAGNOSIS — I129 Hypertensive chronic kidney disease with stage 1 through stage 4 chronic kidney disease, or unspecified chronic kidney disease: Secondary | ICD-10-CM | POA: Diagnosis not present

## 2017-12-22 DIAGNOSIS — E785 Hyperlipidemia, unspecified: Secondary | ICD-10-CM | POA: Diagnosis not present

## 2017-12-22 DIAGNOSIS — N183 Chronic kidney disease, stage 3 (moderate): Secondary | ICD-10-CM | POA: Diagnosis not present

## 2017-12-22 DIAGNOSIS — M109 Gout, unspecified: Secondary | ICD-10-CM | POA: Diagnosis not present

## 2017-12-23 ENCOUNTER — Other Ambulatory Visit: Payer: Self-pay | Admitting: Physician Assistant

## 2018-01-22 ENCOUNTER — Encounter: Payer: Self-pay | Admitting: Cardiovascular Disease

## 2018-01-22 ENCOUNTER — Ambulatory Visit: Payer: Medicare Other | Admitting: Cardiovascular Disease

## 2018-01-22 VITALS — BP 138/78 | HR 68 | Ht 67.0 in | Wt 171.0 lb

## 2018-01-22 DIAGNOSIS — Z8739 Personal history of other diseases of the musculoskeletal system and connective tissue: Secondary | ICD-10-CM

## 2018-01-22 DIAGNOSIS — E785 Hyperlipidemia, unspecified: Secondary | ICD-10-CM

## 2018-01-22 DIAGNOSIS — I10 Essential (primary) hypertension: Secondary | ICD-10-CM

## 2018-01-22 DIAGNOSIS — Z8673 Personal history of transient ischemic attack (TIA), and cerebral infarction without residual deficits: Secondary | ICD-10-CM | POA: Diagnosis not present

## 2018-01-22 DIAGNOSIS — I447 Left bundle-branch block, unspecified: Secondary | ICD-10-CM

## 2018-01-22 DIAGNOSIS — I5022 Chronic systolic (congestive) heart failure: Secondary | ICD-10-CM

## 2018-01-22 NOTE — Progress Notes (Signed)
Cardiology Office Note    Date:  01/24/2018   ID:  John Hines, DOB 1932-07-19, MRN [id number]  PCP:  Orlena Sheldon, PA-C  Cardiologist:  Shelva Majestic, MD   New evaluation: re-establishment of cardiology care   History of Present Illness:  John Hines is a 84 y.o. male  who presents to the office today to reestablish cardiology care.    John Hines  suffered a right cerebellar ischemic infarction in the territory of the right posterior inferior cerebellar artery remotely. He has a history of hypertension as well as hyperlipidemia. In October 2011 his ejection fraction was 25-35% and subsequent echo Doppler assessment in February 2012 showed an ejection fraction in the range of 35-45%. He had grade 2 diastolic dysfunction with left bundle branch block related to asynchrony and also elevation of left atrial pressures by tissue Doppler. He had mild MR as well as mild aortic sclerosis with mild RV dilatation and mild delayed dilation. RV systolic pressure was 27.  He has been on therapy for hypertension. He  has a solitary functioning left kidney and a nonfunctioning right kidney. There is also a history of dyslipidemia, arthritis, BPH, erectile dysfunction.  He sees Karis Juba for primary care. I had seen him for an initial evaluation in 2012 and after not seeing him in several years he was last seen in February 2015.  In February 2015 an echo Doppler study showed an EF of 35 - 40% and he had grade 1 diastolic dysfunction.  His mild aortic sclerosis with mild AR and trivial MR and TR.  He is now 84 years old and remains very active.  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  Starts work at 4 AM and typically is done by 1:59 PM in the afternoon. He tries to go to bed by 9 PM but wakes up at 2:30 AM for work. His wife has stage IV cancer.  He was last seen by Karis Juba in November 2018.  He sees Dr. Lorrene Reid at [place]  [facility].  He has been taking amlodipine/olmesartain 5/40 nadolol 12.5 mg twice a day for hypertension.  He is on atorvastatin 80 mg for hyperlipidemia.  He takes supplemental iron for iron deficiency anemia.  He has a history of gout and takes Uloric 40 mg.  He denies any chest pain.  He denies palpitations.  He is able to cut the grass without symptoms.  He presents to reestablish cardiology care.  Past Medical History:  Diagnosis Date  . Arthritis   . BPH (benign prostatic hyperplasia)   . CHF (congestive heart failure) ([place])    Nonischemic Cardiomyopathy. EF 25-35%  . Chronic kidney disease 05/20/2013   CKD 3  . Colon polyps   . Dyslipidemia   . Dysrhythmia 08/19/11   "low heart beat; maybe 25bpm; got RX; now 60bpm"  . ED (erectile dysfunction)   . Gout   . Heart murmur   . Hyperlipidemia   . Hypertension   . Low back pain 08/12/2013  . Nonfunctioning kidney Recognized 03/2006   Chronically and Severely Hydronephrotic and Nonfunctioning Right Kidney  . Solitary kidney Recognized 03/2006   Solitary Functioning Left Kidney  . Stroke [facility])    "mild stroke; dx'd 2010; don't know when it happened"  . Syncope and collapse 08/19/11   "shallow breathing; sweating horribly"    Past Surgical History:  Procedure Laterality Date  .  CARDIOVASCULAR STRESS TEST  08/30/2010   Small to moderate fixed inferoseptal defect. Severe global hypokinesis with inferior akinesis and septal dyskinesis-likely secondary to a prominent LBBB. Non-diagnostic for ischemia due to persantine.  Marland Kitchen KNEE ARTHROSCOPY  1996   right  . TRANSTHORACIC ECHOCARDIOGRAM  08/20/2011   EF 45-50%, severe concentric hyperthrophy.    Current Medications: Outpatient Medications Prior to Visit  Medication Sig Dispense Refill  . amLODipine-olmesartan (AZOR) 5-40 MG tablet TAKE 1 TABLET BY MOUTH EVERY DAY 90 tablet 1  . aspirin EC 81 MG tablet Take 81 mg by mouth daily.      Marland Kitchen atorvastatin (LIPITOR) 80 MG tablet Take 1 tablet  (80 mg total) by mouth daily. 90 tablet 0  . carvedilol (COREG) 12.5 MG tablet TAKE 1 TABLET (12.5 MG TOTAL) BY MOUTH 2 (TWO) TIMES DAILY WITH A MEAL. 180 tablet 0  . ferrous sulfate 325 (65 FE) MG tablet TAKE 1 TABLET (325 MG TOTAL) BY MOUTH DAILY. 90 tablet 2  . sildenafil (VIAGRA) 100 MG tablet Take 1 tablet (100 mg total) by mouth daily as needed for erectile dysfunction. 6 tablet 11  . ULORIC 40 MG tablet TAKE 1 TABLET BY MOUTH EVERY DAY 90 tablet 0   No facility-administered medications prior to visit.      Allergies:   Penicillins   Social History   Socioeconomic History  . Marital status: Married    Spouse name: Not on file  . Number of children: Not on file  . Years of education: Not on file  . Highest education level: Not on file  Occupational History  . Not on file  Social Needs  . Financial resource strain: Not on file  . Food insecurity:    Worry: Not on file    Inability: Not on file  . Transportation needs:    Medical: Not on file    Non-medical: Not on file  Tobacco Use  . Smoking status: Never Smoker  . Smokeless tobacco: Never Used  Substance and Sexual Activity  . Alcohol use: Yes    Comment: "social drinker; last time 08/15/11"  . Drug use: No  . Sexual activity: Yes  Lifestyle  . Physical activity:    Days per week: Not on file    Minutes per session: Not on file  . Stress: Not on file  Relationships  . Social connections:    Talks on phone: Not on file    Gets together: Not on file    Attends religious service: Not on file    Active member of club or organization: Not on file    Attends meetings of clubs or organizations: Not on file    Relationship status: Not on file  Other Topics Concern  . Not on file  Social History Narrative  . Not on file    He is married.  There is no tobacco history.  Family History:  The patient's family history includes Cancer in his father.  Parents are deceased  ROS General: Negative; No fevers, chills, or  night sweats;  HEENT: Negative; No changes in vision or hearing, sinus congestion, difficulty swallowing Pulmonary: Negative; No cough, wheezing, shortness of breath, hemoptysis Cardiovascular: Negative; No chest pain, presyncope, syncope, palpitations GI: Negative; No nausea, vomiting, diarrhea, or abdominal pain GU: Negative; No dysuria, hematuria, or difficulty voiding Musculoskeletal: Negative; no myalgias, joint pain, or weakness Hematologic/Oncology: Negative; no easy bruising, bleeding Endocrine: Negative; no heat/cold intolerance; no diabetes Neuro: Negative; no changes in balance, headaches Skin: Negative;  No rashes or skin lesions Psychiatric: Negative; No behavioral problems, depression Sleep: Negative; No snoring, daytime sleepiness, hypersomnolence, bruxism, restless legs, hypnogognic hallucinations, no cataplexy Other comprehensive 14 point system review is negative.   PHYSICAL EXAM:   VS:  BP 138/78 (BP Location: Right Arm)   Pulse 68   Ht '5\' 7"'  (1.702 m)   Wt 171 lb (77.6 kg)   BMI 26.78 kg/m     Repeat blood pressure by me was 140/80  Wt Readings from Last 3 Encounters:  01/22/18 171 lb (77.6 kg)  09/03/17 170 lb 3.2 oz (77.2 kg)  08/07/17 169 lb 6.4 oz (76.8 kg)    General: Alert, oriented, no distress.  Skin: normal turgor, no rashes, warm and dry HEENT: Normocephalic, atraumatic. Pupils equal round and reactive to light; sclera anicteric; extraocular muscles intact; Fundi mild increase vessel tortuosity.  No AV nicking.  Disc flat Nose without nasal septal hypertrophy Mouth/Parynx benign; Mallinpatti scale 3 Neck: No JVD, no carotid bruits; normal carotid upstroke Lungs: clear to ausculatation and percussion; no wheezing or rales Chest wall: without tenderness to palpitation Heart: PMI not displaced, RRR, s1 s2 normal, 1/6 systolic murmur, no diastolic murmur, no rubs, gallops, thrills, or heaves Abdomen: soft, nontender; no hepatosplenomehaly, BS+;  abdominal aorta nontender and not dilated by palpation. Back: no CVA tenderness Pulses 2+ Musculoskeletal: full range of motion, normal strength, no joint deformities Extremities: no clubbing cyanosis or edema, Homan's sign negative  Neurologic: grossly nonfocal; Cranial nerves grossly wnl Psychologic: Normal mood and affect   Studies/Labs Reviewed:   EKG:  EKG is ordered today.  ECG (independently read by me): Normal sinus rhythm with a PAC.  Left bundle branch block with repolarization changes.  PR interval 170 ms, QTc interval '4 6 9 ' ms.   February 2015 ECG (independently read by me): Sinus rhythm at 67 beats per minute with left bundle-branch block and associated repolarization changes. No ectopy.  LABS: Recent Labs: BMP Latest Ref Rng & Units 08/07/2017 02/06/2017 03/21/2016  Glucose 65 - 99 mg/dL 108(H) 104(H) 103(H)  BUN 7 - 25 mg/dL '14 16 18  ' Creatinine 0.70 - 1.11 mg/dL 1.23(H) 1.31(H) 1.31(H)  BUN/Creat Ratio 6 - 22 (calc) 11 - -  Sodium 135 - 146 mmol/L 142 143 143  Potassium 3.5 - 5.3 mmol/L 4.2 4.5 4.7  Chloride 98 - 110 mmol/L 107 108 107  CO2 20 - 32 mmol/L '28 26 29  ' Calcium 8.6 - 10.3 mg/dL 9.0 9.2 9.2     Hepatic Function Latest Ref Rng & Units 08/07/2017 02/06/2017 03/21/2016  Total Protein 6.1 - 8.1 g/dL 6.6 6.5 6.6  Albumin 3.6 - 5.1 g/dL - 4.1 3.8  AST 10 - 35 U/L '13 14 14  ' ALT 9 - 46 U/L '11 13 11  ' Alk Phosphatase 40 - 115 U/L - 95 97  Total Bilirubin 0.2 - 1.2 mg/dL 0.8 0.7 0.7    CBC Latest Ref Rng & Units 08/07/2017 02/06/2017 09/08/2015  WBC 3.8 - 10.8 Thousand/uL 4.3 4.1 4.8  Hemoglobin 13.2 - 17.1 g/dL 13.1(L) 12.8(L) 12.9(L)  Hematocrit 38.5 - 50.0 % 38.0(L) 39.0 37.2(L)  Platelets 140 - 400 Thousand/uL 215 186 195   Lab Results  Component Value Date   MCV 80.5 08/07/2017   MCV 82.3 02/06/2017   MCV 79.7 09/08/2015   Lab Results  Component Value Date   TSH 3.82 02/06/2017   Lab Results  Component Value Date   HGBA1C 6.8 (H) 08/07/2017  BNP No results found for: BNP  ProBNP    Component Value Date/Time   PROBNP 145.8 08/19/2011 1831     Lipid Panel     Component Value Date/Time   CHOL 133 08/07/2017 0830   TRIG 49 08/07/2017 0830   HDL 49 08/07/2017 0830   CHOLHDL 2.7 08/07/2017 0830   VLDL 13 02/06/2017 0917   LDLCALC 70 08/07/2017 0830     RADIOLOGY: No results found.   Additional studies/ records that were reviewed today include:  I reviewed the patient's prior echo Doppler studies, remote office visit, and most recent office visit with [facility]    ASSESSMENT:    1. Chronic systolic heart failure ([place])   2. Left bundle branch block   3. History of stroke   4. Essential hypertension   5. Hyperlipidemia LDL goal <70   6. History of gout      PLAN:  John Hines is an 84 year old gentleman who has a long-standing history of hypertension, and solitary functioning left kidney with a nonfunctioning right kidney.  He has a history of hyperlipidemia, arthritis, BPH and gout.  He continues to be hard-working and currently is working 7 days per week.  He typically is getting only approximately 5-1/2 hours of sleep per night to a maximum of 6.  With his 7 day a week work habit, he is not having opportunity for sleep catch-up on off weekends.  We discussed the need for increased sleep duration with optimal sleep at least 78 hours if at all possible.  He is doing remarkably well from a functional standpoint despite previous documentation of LV dysfunction and also his prior right cerebellar ischemic infarction.  He has not had an echo almost 2-1/2 years.  I have recommended a follow-up echo Doppler study for reassessment.  I reviewed laboratory done by Karis Juba in November 2018.  Lipid studies were excellent with an LDL of 70 on atorvastatin 80 mg.  He has upper normal blood pressure, currently on combination therapy with amlodipine and olmesartan  5/40 mg and carvedilol 12.5 mg twice  a day.  He has one functioning kidney.  Creatinine was 1.23, which has been stable over the last several years.  He continues to be on iron supplement for the parathyroid deficiency anemia.  Hemoglobin was 13.1, hematocrit 38.0 with an MCV of 80.5.  He has not had any recent episodes of gout and is on Uloric  He continues to take a baby aspirin.  His ECG remained stable with sinus rhythm left bundle branch block, and a PAC.  Marland Kitchen  He will continue current therapy.  I will contact him regarding his echo results.  I will see him in 6 months for reevaluation.  Medication Adjustments/Labs and Tests Ordered: Current medicines are reviewed at length with the patient today.  Concerns regarding medicines are outlined above.  Medication changes, Labs and Tests ordered today are listed in the Patient Instructions below. Patient Instructions  Medication Instructions:  Your physician recommends that you continue on your current medications as directed. Please refer to the Current Medication list given to you today.  Testing/Procedures: Your physician has requested that you have an echocardiogram. Echocardiography is a painless test that uses sound waves to create images of your heart. It provides your doctor with information about the size and shape of your heart and how well your heart's chambers and valves are working. This procedure takes approximately one hour. There are no restrictions for this procedure.  This will be done at our [facility] location:  [place] wants you to follow-up in: 6 months with Dr. Claiborne Billings.  You will receive a reminder letter in the mail two months in advance. If you don't receive a letter, please call our office to schedule the follow-up appointment.    If you need a refill on your cardiac medications before your next appointment, please call your pharmacy.      Signed, Shelva Majestic, MD  01/24/2018 3:30 PM    [facility] [address], [place], [place], [place]  [address] Phone: [phone]

## 2018-01-22 NOTE — Patient Instructions (Signed)
Medication Instructions:  Your physician recommends that you continue on your current medications as directed. Please refer to the Current Medication list given to you today.  Testing/Procedures: Your physician has requested that you have an echocardiogram. Echocardiography is a painless test that uses sound waves to create images of your heart. It provides your doctor with information about the size and shape of your heart and how well your heart's chambers and valves are working. This procedure takes approximately one hour. There are no restrictions for this procedure.  This will be done at our [facility] location:  [place] wants you to follow-up in: 6 months with Dr. Claiborne Billings.  You will receive a reminder letter in the mail two months in advance. If you don't receive a letter, please call our office to schedule the follow-up appointment.    If you need a refill on your cardiac medications before your next appointment, please call your pharmacy.

## 2018-01-24 ENCOUNTER — Encounter: Payer: Self-pay | Admitting: Cardiovascular Disease

## 2018-01-29 ENCOUNTER — Other Ambulatory Visit: Payer: Self-pay

## 2018-01-29 ENCOUNTER — Ambulatory Visit: Payer: Medicare Other | Admitting: Cardiovascular Disease

## 2018-01-29 ENCOUNTER — Ambulatory Visit (HOSPITAL_COMMUNITY): Payer: Medicare Other | Attending: Internal Medicine

## 2018-01-29 DIAGNOSIS — Q600 Renal agenesis, unilateral: Secondary | ICD-10-CM | POA: Insufficient documentation

## 2018-01-29 DIAGNOSIS — I340 Nonrheumatic mitral (valve) insufficiency: Secondary | ICD-10-CM | POA: Insufficient documentation

## 2018-01-29 DIAGNOSIS — I447 Left bundle-branch block, unspecified: Secondary | ICD-10-CM

## 2018-01-29 DIAGNOSIS — E785 Hyperlipidemia, unspecified: Secondary | ICD-10-CM | POA: Insufficient documentation

## 2018-01-29 DIAGNOSIS — I110 Hypertensive heart disease with heart failure: Secondary | ICD-10-CM | POA: Insufficient documentation

## 2018-01-29 DIAGNOSIS — I5022 Chronic systolic (congestive) heart failure: Secondary | ICD-10-CM | POA: Diagnosis not present

## 2018-02-04 ENCOUNTER — Ambulatory Visit: Payer: Medicare Other | Admitting: Physician Assistant

## 2018-02-11 ENCOUNTER — Other Ambulatory Visit: Payer: Self-pay

## 2018-02-11 ENCOUNTER — Ambulatory Visit (INDEPENDENT_AMBULATORY_CARE_PROVIDER_SITE_OTHER): Payer: Medicare Other | Admitting: Physician Assistant

## 2018-02-11 ENCOUNTER — Encounter: Payer: Self-pay | Admitting: Physician Assistant

## 2018-02-11 VITALS — BP 136/80 | HR 72 | Temp 98.1°F | Resp 14 | Ht 67.0 in | Wt 171.8 lb

## 2018-02-11 DIAGNOSIS — I5022 Chronic systolic (congestive) heart failure: Secondary | ICD-10-CM | POA: Diagnosis not present

## 2018-02-11 DIAGNOSIS — N183 Chronic kidney disease, stage 3 unspecified: Secondary | ICD-10-CM

## 2018-02-11 DIAGNOSIS — M1000 Idiopathic gout, unspecified site: Secondary | ICD-10-CM

## 2018-02-11 DIAGNOSIS — N289 Disorder of kidney and ureter, unspecified: Secondary | ICD-10-CM

## 2018-02-11 DIAGNOSIS — I10 Essential (primary) hypertension: Secondary | ICD-10-CM | POA: Diagnosis not present

## 2018-02-11 DIAGNOSIS — R739 Hyperglycemia, unspecified: Secondary | ICD-10-CM | POA: Diagnosis not present

## 2018-02-11 DIAGNOSIS — E785 Hyperlipidemia, unspecified: Secondary | ICD-10-CM

## 2018-02-11 NOTE — Progress Notes (Signed)
Patient ID: John Hines MRN: [id number], DOB: Nov 15, 1931, 84 y.o. Date of Encounter: @DATE @  Chief Complaint:  Chief Complaint  Patient presents with  . check cholesterol  . Blood Pressure Check  . discuss uloric    HPI: 84 y.o. year old male  presents with above.   02/11/2018:  His daughter accompanies him for visit today.  However, they do not have any specific concerns to address.  They both report that he is continued to do very well.  Has seemed to be very stable from a health perspective and is continued to be extremely active.  He has no complaints or concerns today. Says that he feels great. Says that he is still working, just like usual.  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 Says that he has always woken up at about 2:30 AM. Asked what time he goes to bed. Says sometimes 9 sometimes 10 sometimes 11. Says he likes going into work early like he does and then gets off by noon and then still has the whole day to go home and do what he wants.  During the summer---He mows the grass washes the cars and works in his garden.  He continues to have no chest pressure or chest heaviness chest tightness with this exertion. Is noticing no increased shortness of breath. Is having no lower extremity edema.   He is taking his Lipitor as directed. This is causing no myalgias or other adverse effects. He is taking blood pressure medication as directed. Having no lightheadedness.  He has had no gout flares since last visit. At [place] 02/11/2018----patient and daughter states that they had recently seen information stating that there was a lawsuit against Uloric saying that Uloric causes heart attack and stroke.  Therefore he has stopped taking the Uloric.  Has been off of it for 2 weeks.  Has had no gout flares.  Is very strict with  his diet and careful with his dietary intake hoping that this is helping prevent gout.  He had been seeing Dr. Lorrene Reid at [facility]. I recently received note from her.   ADDENDUM added 12/29/2017: Received note from Dr. Lorrene Reid at [facility].   Her note was dated 01/2018.  He presented there for routine follow-up.   Impression was "has had remarkably stable kidney function in his solitary kidney.  I think still okay for once a year follow-up."  Also he had seen Dr. Claiborne Billings in the past for cardiology but then he got a period of time without follow-up there.  Recently referred him back to cardiology just for routine follow-up.  Today I reviewed his office visit note from his visit with Dr. Claiborne Billings 01/22/2018.  At that visit Dr. Claiborne Billings felt that he was clinically stable and doing very well.  He did order an echocardiogram for follow-up evaluation.  They are reviewed that he did have echo on 01/29/2018.  This showed the EF had dropped to 20 to 25%.  Dr. Evette Georges result note and noted that at the next office visit he would discuss prophylactic ICD.  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  Today reviewed information with patient and daughter and discussed that he had done remarkably well regarding his solitary kidney and also that he  is doing remarkably well clinically given his EF of 20 to 25%.  Even after this discussion, he still reports that he has noticed no increased shortness of breath, is having no orthopnea, is having no lower extremity edema.   Past Medical History:  Diagnosis Date  . Arthritis   . BPH (benign prostatic hyperplasia)   . CHF (congestive heart failure) ([place])    Nonischemic Cardiomyopathy. EF 25-35%  . Chronic kidney disease 05/20/2013   CKD 3  . Colon polyps   . Dyslipidemia   . Dysrhythmia 08/19/11   "low heart beat; maybe 25bpm; got RX; now 60bpm"  . ED (erectile dysfunction)   . Gout     . Heart murmur   . Hyperlipidemia   . Hypertension   . Low back pain 08/12/2013  . Nonfunctioning kidney Recognized 03/2006   Chronically and Severely Hydronephrotic and Nonfunctioning Right Kidney  . Solitary kidney Recognized 03/2006   Solitary Functioning Left Kidney  . Stroke [facility])    "mild stroke; dx'd 2010; don't know when it happened"  . Syncope and collapse 08/19/11   "shallow breathing; sweating horribly"     Home Meds: Outpatient Medications Prior to Visit  Medication Sig Dispense Refill  . amLODipine-olmesartan (AZOR) 5-40 MG tablet TAKE 1 TABLET BY MOUTH EVERY DAY 90 tablet 1  . aspirin EC 81 MG tablet Take 81 mg by mouth daily.      Marland Kitchen atorvastatin (LIPITOR) 80 MG tablet Take 1 tablet (80 mg total) by mouth daily. 90 tablet 0  . carvedilol (COREG) 12.5 MG tablet TAKE 1 TABLET (12.5 MG TOTAL) BY MOUTH 2 (TWO) TIMES DAILY WITH A MEAL. 180 tablet 0  . ferrous sulfate 325 (65 FE) MG tablet TAKE 1 TABLET (325 MG TOTAL) BY MOUTH DAILY. 90 tablet 2  . sildenafil (VIAGRA) 100 MG tablet Take 1 tablet (100 mg total) by mouth daily as needed for erectile dysfunction. 6 tablet 11  . ULORIC 40 MG tablet TAKE 1 TABLET BY MOUTH EVERY DAY 90 tablet 0   No facility-administered medications prior to visit.     Allergies:  Allergies  Allergen Reactions  . Penicillins Swelling    Social History   Socioeconomic History  . Marital status: Married    Spouse name: Not on file  . Number of children: Not on file  . Years of education: Not on file  . Highest education level: Not on file  Occupational History  . Not on file  Social Needs  . Financial resource strain: Not on file  . Food insecurity:    Worry: Not on file    Inability: Not on file  . Transportation needs:    Medical: Not on file    Non-medical: Not on file  Tobacco Use  . Smoking status: Never Smoker  . Smokeless tobacco: Never Used  Substance and Sexual Activity  . Alcohol use: Yes    Comment: "social drinker;  last time 08/15/11"  . Drug use: No  . Sexual activity: Yes  Lifestyle  . Physical activity:    Days per week: Not on file    Minutes per session: Not on file  . Stress: Not on file  Relationships  . Social connections:    Talks on phone: Not on file    Gets together: Not on file    Attends religious service: Not on file    Active member of club or organization: Not on file    Attends meetings of clubs or organizations: Not  on file    Relationship status: Not on file  . Intimate partner violence:    Fear of current or ex partner: Not on file    Emotionally abused: Not on file    Physically abused: Not on file    Forced sexual activity: Not on file  Other Topics Concern  . Not on file  Social History Narrative  . Not on file    Family History  Problem Relation Age of Onset  . Cancer Father      Review of Systems:  See HPI for pertinent ROS. All other ROS negative.    Physical Exam: Blood pressure 136/80, pulse 72, temperature 98.1 F (36.7 C), temperature source Oral, resp. rate 14, height 5\' 7"  (1.702 m), weight 77.9 kg (171 lb 12.8 oz), SpO2 98 %., Body mass index is 26.91 kg/m. General:  Very Pleasant. WNWD AAM. Appears in no acute distress. Neck: Supple. No thyromegaly. No lymphadenopathy. No carotid bruits. Lungs: Clear bilaterally to auscultation without wheezes, rales, or rhonchi. Breathing is unlabored. Heart: RRR with S1 S2. No murmurs, rubs, or gallops. Abdomen: Soft, non-tender, non-distended with normoactive bowel sounds. No hepatomegaly. No rebound/guarding. No obvious abdominal masses. Musculoskeletal:  Strength and tone normal for age. Extremities/Skin: Warm and dry. No LE edema.  Neuro: Alert and oriented X 3. Moves all extremities spontaneously. Gait is normal. CNII-XII grossly in tact. Psych:  Responds to questions appropriately with a normal affect.     ASSESSMENT AND PLAN:  84 y.o. year old male with   Hyperlipidemia He is on statin. Recheck  labs now to monitor.   Essential hypertension Blood pressure at goal. Check labs to monitor. Continue current medications.   Hyperglycemia 02/11/2018: Reviewed that at his last visit, last labs on 08/07/2017 A1c came back 6.8.  At that time had recommended to add Januvia 50 mg daily.  ( Given CKD would avoid metformin and higher dose Januvia.  Given CHF would avoid Actos). However there are then phone notes documented on 08/13/2017.  Stated that the Januvia was going to cost $400.   Decided that A1c was not that high at that we simply would not add any medication at that time.   Would plan for patient to be extra careful with his diet and decrease carbohydrates and sweets and would wait to recheck and monitor A1c. Today he and daughter report that he is very careful with his diet and very compliant.  Recheck A1c now to monitor.   Chronic systolic heart failure ([place]) He remains clinically stable regarding this. At [place] 08/2015---" However I reviewed with him today that his last visit with Dr. Claiborne Billings was February 2015 and he was supposed to be following up with him annually. Patient promises to go home and call and schedule follow-up appointment with cardiology to day." At [place] 02/2016-- he says that he missed his appointment with Dr. Claiborne Billings 08/07/2017: Reports that he has not seen Dr. Claiborne Billings in several years.  Told him to go ahead and follow-up with him. 02/11/2018: Did have recent follow-up visit with Dr. Claiborne Billings on 01/22/2018.  Had echocardiogram 01/29/2018.  EF was down to 20 to 25%.  Dr. Claiborne Billings noted that at the next office visit he would discuss prophylactic ICD.  Patient clinically stable despite decreased EF.  Gout without tophus, unspecified cause, unspecified chronicity, unspecified site He states that he has had no gout flares. Have not checked a uric acid level since 08/2013.  Recheck uric acid level now-- 02/2016 08/07/17: He reports  no recent gout flares.  He is on U [place].  Recheck lab to  monitor. 02/11/2018: Today's visit patient and daughter reports that they had seen reports against Uloric stating that Uloric causes heart attacks and strokes that he has stopped the Uloric and has been off of it for 2 weeks.  Has had no gout flares.  At this point I have told him to stay off of Uloric.  Cannot use allopurinol because of his CKD.  He will monitor for gout flares.  Will continue strict diet.  Solitary kidney He has been evaluated by Dr. Lorrene Reid at [facility]. F/U labs now. He sees Dr. Lorrene Reid once a year 08/07/17: Check lab to monitor now.  Also told him to call and schedule follow-up visit with Dr. Lorrene Reid and he is agreeable to do so. ADDENDUM added 12/29/2017: Received note from Dr. Lorrene Reid at [facility].   Her note was dated 01/2018.  He presented there for routine follow-up.   Impression was "has had remarkably stable kidney function in his solitary kidney.  I think still okay for once a year follow-up."   Erectile dysfunction, unspecified erectile dysfunction type Continue Viagra as needed.  Chronic kidney disease, stage III (moderate) (HCC) He has been evaluated by Dr. Lorrene Reid at [facility]. F/U labs now. He sees Dr. Lorrene Reid once a year 08/07/17: Check lab to monitor now.  Also told him to call and schedule follow-up visit with Dr. Lorrene Reid and he is agreeable to do so. - CBC with Differential/Platelet - COMPLETE METABOLIC PANEL WITH GFR ADDENDUM added 12/29/2017: Received note from Dr. Lorrene Reid at [facility].   Her note was dated 01/2018.  He presented there for routine follow-up.   Impression was "has had remarkably stable kidney function in his solitary kidney.  I think still okay for once a year follow-up."  Nonfunctioning kidney - COMPLETE METABOLIC PANEL WITH GFR ADDENDUM added 12/29/2017: Received note from Dr. Lorrene Reid at [facility].   Her note was dated 01/2018.  He presented there for routine  follow-up.   Impression was "has had remarkably stable kidney function in his solitary kidney.  I think still okay for once a year follow-up."  Iron deficiency anemia, unspecified iron deficiency anemia type Is on iron supplement.  Check lab to monitor. - CBC with Differential/Platelet---was checked by me at lab 07/2017---this was stable---wait to recheck---suspect Dr Lorrene Reid is checking this lab at her visits as well.   Will check labs.  Will plan for follow-up office visit 6 months, sooner if needed.  Signed, [address] [place], [place], [facility] 02/11/2018 10:20 AM

## 2018-02-12 LAB — COMPLETE METABOLIC PANEL WITH GFR
AG RATIO: 1.8 (calc) (ref 1.0–2.5)
ALT: 16 U/L (ref 9–46)
AST: 17 U/L (ref 10–35)
Albumin: 4 g/dL (ref 3.6–5.1)
Alkaline phosphatase (APISO): 101 U/L (ref 40–115)
BUN/Creatinine Ratio: 13 (calc) (ref 6–22)
BUN: 16 mg/dL (ref 7–25)
CHLORIDE: 109 mmol/L (ref 98–110)
CO2: 29 mmol/L (ref 20–32)
Calcium: 8.9 mg/dL (ref 8.6–10.3)
Creat: 1.2 mg/dL — ABNORMAL HIGH (ref 0.70–1.11)
GFR, EST AFRICAN AMERICAN: 64 mL/min/{1.73_m2} (ref >=60)
GFR, Est Non African American: 55 mL/min/{1.73_m2} — ABNORMAL LOW (ref >=60)
GLOBULIN: 2.2 g/dL (ref 1.9–3.7)
Glucose, Bld: 99 mg/dL (ref 65–99)
POTASSIUM: 4.5 mmol/L (ref 3.5–5.3)
SODIUM: 142 mmol/L (ref 135–146)
TOTAL PROTEIN: 6.2 g/dL (ref 6.1–8.1)
Total Bilirubin: 0.7 mg/dL (ref 0.2–1.2)

## 2018-02-12 LAB — HEMOGLOBIN A1C
EAG (MMOL/L): 8.2 (calc)
HEMOGLOBIN A1C: 6.8 %{Hb} — AB (ref <5.7)
MEAN PLASMA GLUCOSE: 148 (calc)

## 2018-02-27 ENCOUNTER — Telehealth: Payer: Self-pay | Admitting: *Deleted

## 2018-02-27 NOTE — Telephone Encounter (Signed)
Called and left message to call back to schedule OV with Dr. Claiborne Billings 6/20

## 2018-02-27 NOTE — Telephone Encounter (Signed)
Patient scheduled.

## 2018-03-12 ENCOUNTER — Encounter: Payer: Self-pay | Admitting: Cardiovascular Disease

## 2018-03-12 ENCOUNTER — Ambulatory Visit: Payer: Medicare Other | Admitting: Cardiovascular Disease

## 2018-03-12 VITALS — BP 126/78 | HR 64 | Ht 66.0 in | Wt 171.0 lb

## 2018-03-12 DIAGNOSIS — Q600 Renal agenesis, unilateral: Secondary | ICD-10-CM

## 2018-03-12 DIAGNOSIS — I447 Left bundle-branch block, unspecified: Secondary | ICD-10-CM

## 2018-03-12 DIAGNOSIS — I10 Essential (primary) hypertension: Secondary | ICD-10-CM | POA: Diagnosis not present

## 2018-03-12 DIAGNOSIS — E785 Hyperlipidemia, unspecified: Secondary | ICD-10-CM

## 2018-03-12 DIAGNOSIS — IMO0002 Reserved for concepts with insufficient information to code with codable children: Secondary | ICD-10-CM

## 2018-03-12 DIAGNOSIS — Z8673 Personal history of transient ischemic attack (TIA), and cerebral infarction without residual deficits: Secondary | ICD-10-CM

## 2018-03-12 DIAGNOSIS — I5022 Chronic systolic (congestive) heart failure: Secondary | ICD-10-CM | POA: Diagnosis not present

## 2018-03-12 MED ORDER — SACUBITRIL-VALSARTAN 49-51 MG PO TABS: 1.0000 | ORAL_TABLET | Freq: Two times a day (BID) | ORAL | Status: DC

## 2018-03-12 MED ORDER — AMLODIPINE BESYLATE 5 MG PO TABS: 5.0000 mg | ORAL_TABLET | Freq: Every day | ORAL | 1 refills | Status: DC

## 2018-03-12 NOTE — Progress Notes (Signed)
Cardiology Office Note    Date:  03/14/2018   ID:  ASHELY JOSHUA, DOB Jul 23, 1932, MRN [id number]  PCP:  Orlena Sheldon, PA-C  Cardiologist:  Shelva Majestic, MD   New evaluation: re-establishment of cardiology care   History of Present Illness:  John Hines is a 84 y.o. male  who presents to the office today to reestablish cardiology care.    Mr. John Hines  suffered a right cerebellar ischemic infarction in the territory of the right posterior inferior cerebellar artery remotely. He has a history of hypertension as well as hyperlipidemia. In October 2011 his ejection fraction was 25-35% and subsequent echo Doppler assessment in February 2012 showed an ejection fraction in the range of 35-45%. He had grade 2 diastolic dysfunction with left bundle branch block related to asynchrony and also elevation of left atrial pressures by tissue Doppler. He had mild MR as well as mild aortic sclerosis with mild RV dilatation and mild delayed dilation. RV systolic pressure was 27.  He has been on therapy for hypertension. He  has a solitary functioning left kidney and a nonfunctioning right kidney. There is also a history of dyslipidemia, arthritis, BPH, erectile dysfunction.  He sees Karis Juba for primary care. I had seen him for an initial evaluation in 2012 and after not seeing him in several years he was last seen in February 2015.  In February 2015 an echo Doppler study showed an EF of 35 - 40% and he had grade 1 diastolic dysfunction.  His mild aortic sclerosis with mild AR and trivial MR and TR.  He is now 84 years old and remains very active.  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  Starts work at 4 AM and typically is done by 1:59 PM in the afternoon. He tries to go to bed by 9 PM but wakes up at 2:30 AM for work. His wife has stage IV cancer.  He was last seen by Karis Juba in November 2018.  He sees Dr. Lorrene Reid at [place]  [facility].  He has been taking amlodipine/olmesartain 5/40 nadolol 12.5 mg twice a day for hypertension.  He is on atorvastatin 80 mg for hyperlipidemia.  He takes supplemental iron for iron deficiency anemia.  He has a history of gout and takes Uloric 40 mg.  He denies any chest pain.  He denies palpitations.  He is able to cut the grass without symptoms.  When I saw him in May 2 to reestablish cardiology care recommended that he undergo a follow-up echo Doppler study which was done on Jan 29, 2018.  On this study, EF was now 20 to 25%.  There was grade 1 diastolic dysfunction.  Patient has remained relatively asymptomatic but upon further questioning he does note some shortness of breath with activity and most likely has class II heart failure symptoms.  He saw Karis Juba in follow-up.  He continues to be on ablation therapy with amlodipine/olmesartan 5/40 daily, carvedilol 12.5 mg aspirin and atorvastatin 80 mg.  He presents for reevaluation.  Past Medical History:  Diagnosis Date  . Arthritis   . BPH (benign prostatic hyperplasia)   . CHF (congestive heart failure) ([place])    Nonischemic Cardiomyopathy. EF 25-35%  . Chronic kidney disease 05/20/2013   CKD 3  . Colon polyps   . Dyslipidemia   . Dysrhythmia 08/19/11   "low heart beat; maybe 25bpm; got RX;  now 60bpm"  . ED (erectile dysfunction)   . Gout   . Heart murmur   . Hyperlipidemia   . Hypertension   . Low back pain 08/12/2013  . Nonfunctioning kidney Recognized 03/2006   Chronically and Severely Hydronephrotic and Nonfunctioning Right Kidney  . Solitary kidney Recognized 03/2006   Solitary Functioning Left Kidney  . Stroke [facility])    "mild stroke; dx'd 2010; don't know when it happened"  . Syncope and collapse 08/19/11   "shallow breathing; sweating horribly"    Past Surgical History:  Procedure Laterality Date  . CARDIOVASCULAR STRESS TEST  08/30/2010   Small to moderate fixed inferoseptal defect. Severe global hypokinesis  with inferior akinesis and septal dyskinesis-likely secondary to a prominent LBBB. Non-diagnostic for ischemia due to persantine.  Marland Kitchen KNEE ARTHROSCOPY  1996   right  . TRANSTHORACIC ECHOCARDIOGRAM  08/20/2011   EF 45-50%, severe concentric hyperthrophy.    Current Medications: Outpatient Medications Prior to Visit  Medication Sig Dispense Refill  . aspirin EC 81 MG tablet Take 81 mg by mouth daily.      Marland Kitchen atorvastatin (LIPITOR) 80 MG tablet Take 1 tablet (80 mg total) by mouth daily. 90 tablet 0  . carvedilol (COREG) 12.5 MG tablet Take 12.5 mg by mouth daily.    . ferrous sulfate 325 (65 FE) MG tablet TAKE 1 TABLET (325 MG TOTAL) BY MOUTH DAILY. 90 tablet 2  . sildenafil (VIAGRA) 100 MG tablet Take 1 tablet (100 mg total) by mouth daily as needed for erectile dysfunction. 6 tablet 11  . amLODipine-olmesartan (AZOR) 5-40 MG tablet TAKE 1 TABLET BY MOUTH EVERY DAY 90 tablet 1  . carvedilol (COREG) 12.5 MG tablet TAKE 1 TABLET (12.5 MG TOTAL) BY MOUTH 2 (TWO) TIMES DAILY WITH A MEAL. 180 tablet 0   No facility-administered medications prior to visit.      Allergies:   Penicillins   Social History   Socioeconomic History  . Marital status: Married    Spouse name: Not on file  . Number of children: Not on file  . Years of education: Not on file  . Highest education level: Not on file  Occupational History  . Not on file  Social Needs  . Financial resource strain: Not on file  . Food insecurity:    Worry: Not on file    Inability: Not on file  . Transportation needs:    Medical: Not on file    Non-medical: Not on file  Tobacco Use  . Smoking status: Never Smoker  . Smokeless tobacco: Never Used  Substance and Sexual Activity  . Alcohol use: Yes    Comment: "social drinker; last time 08/15/11"  . Drug use: No  . Sexual activity: Yes  Lifestyle  . Physical activity:    Days per week: Not on file    Minutes per session: Not on file  . Stress: Not on file  Relationships    . Social connections:    Talks on phone: Not on file    Gets together: Not on file    Attends religious service: Not on file    Active member of club or organization: Not on file    Attends meetings of clubs or organizations: Not on file    Relationship status: Not on file  Other Topics Concern  . Not on file  Social History Narrative  . Not on file    He is married.  There is no tobacco history.  Family History:  The  patient's family history includes Cancer in his father.  Parents are deceased  ROS General: Negative; No fevers, chills, or night sweats;  HEENT: Negative; No changes in vision or hearing, sinus congestion, difficulty swallowing Pulmonary: Negative; No cough, wheezing, shortness of breath, hemoptysis Cardiovascular: Negative; No chest pain, presyncope, syncope, palpitations GI: Negative; No nausea, vomiting, diarrhea, or abdominal pain GU: Negative; No dysuria, hematuria, or difficulty voiding Musculoskeletal: Negative; no myalgias, joint pain, or weakness Hematologic/Oncology: Negative; no easy bruising, bleeding Endocrine: Negative; no heat/cold intolerance; no diabetes Neuro: Negative; no changes in balance, headaches Skin: Negative; No rashes or skin lesions Psychiatric: Negative; No behavioral problems, depression Sleep: Negative; No snoring, daytime sleepiness, hypersomnolence, bruxism, restless legs, hypnogognic hallucinations, no cataplexy Other comprehensive 14 point system review is negative.   PHYSICAL EXAM:   VS:  BP 126/78   Pulse 64   Ht '5\' 6"'  (1.676 m)   Wt 171 lb (77.6 kg)   SpO2 96%   BMI 27.60 kg/m     Repeat blood pressure by me was 140/78  Wt Readings from Last 3 Encounters:  03/12/18 171 lb (77.6 kg)  02/11/18 171 lb 12.8 oz (77.9 kg)  01/22/18 171 lb (77.6 kg)    General: Alert, oriented, no distress.  Skin: normal turgor, no rashes, warm and dry HEENT: Normocephalic, atraumatic. Pupils equal round and reactive to light; sclera  anicteric; extraocular muscles intact;  Nose without nasal septal hypertrophy Mouth/Parynx benign; Mallinpatti scale 3 Neck: No JVD, no carotid bruits; normal carotid upstroke Lungs: clear to ausculatation and percussion; no wheezing or rales Chest wall: without tenderness to palpitation Heart: PMI not displaced, RRR, s1 s2 normal, 1/6 systolic murmur, no diastolic murmur, no rubs, gallops, thrills, or heaves Abdomen: soft, nontender; no hepatosplenomehaly, BS+; abdominal aorta nontender and not dilated by palpation. Back: no CVA tenderness Pulses 2+ Musculoskeletal: full range of motion, normal strength, no joint deformities Extremities: no clubbing cyanosis or edema, Homan's sign negative  Neurologic: grossly nonfocal; Cranial nerves grossly wnl Psychologic: Normal mood and affect   Studies/Labs Reviewed:   EKG:  EKG is ordered today.  ECG (independently read by me): Normal sinus rhythm at 64 bpm.  Left bundle branch block with repolarization changes.  QTc interval 470 ms.  No ectopy.  May 10/2017 ECG (independently read by me): Normal sinus rhythm with a PAC.  Left bundle branch block with repolarization changes.  PR interval 170 ms, QTc interval '4 6 9 ' ms.  February 2015 ECG (independently read by me): Sinus rhythm at 67 beats per minute with left bundle-branch block and associated repolarization changes. No ectopy.  LABS: Recent Labs: BMP Latest Ref Rng & Units 02/11/2018 08/07/2017 02/06/2017  Glucose 65 - 99 mg/dL 99 108(H) 104(H)  BUN 7 - 25 mg/dL '16 14 16  ' Creatinine 0.70 - 1.11 mg/dL 1.20(H) 1.23(H) 1.31(H)  BUN/Creat Ratio 6 - 22 (calc) 13 11 -  Sodium 135 - 146 mmol/L 142 142 143  Potassium 3.5 - 5.3 mmol/L 4.5 4.2 4.5  Chloride 98 - 110 mmol/L 109 107 108  CO2 20 - 32 mmol/L '29 28 26  ' Calcium 8.6 - 10.3 mg/dL 8.9 9.0 9.2     Hepatic Function Latest Ref Rng & Units 02/11/2018 08/07/2017 02/06/2017  Total Protein 6.1 - 8.1 g/dL 6.2 6.6 6.5  Albumin 3.6 - 5.1 g/dL - -  4.1  AST 10 - 35 U/L '17 13 14  ' ALT 9 - 46 U/L '16 11 13  ' Alk Phosphatase 40 - 115 U/L - -  95  Total Bilirubin 0.2 - 1.2 mg/dL 0.7 0.8 0.7    CBC Latest Ref Rng & Units 08/07/2017 02/06/2017 09/08/2015  WBC 3.8 - 10.8 Thousand/uL 4.3 4.1 4.8  Hemoglobin 13.2 - 17.1 g/dL 13.1(L) 12.8(L) 12.9(L)  Hematocrit 38.5 - 50.0 % 38.0(L) 39.0 37.2(L)  Platelets 140 - 400 Thousand/uL 215 186 195   Lab Results  Component Value Date   MCV 80.5 08/07/2017   MCV 82.3 02/06/2017   MCV 79.7 09/08/2015   Lab Results  Component Value Date   TSH 3.82 02/06/2017   Lab Results  Component Value Date   HGBA1C 6.8 (H) 02/11/2018     BNP No results found for: BNP  ProBNP    Component Value Date/Time   PROBNP 145.8 08/19/2011 1831     Lipid Panel     Component Value Date/Time   CHOL 133 08/07/2017 0830   TRIG 49 08/07/2017 0830   HDL 49 08/07/2017 0830   CHOLHDL 2.7 08/07/2017 0830   VLDL 13 02/06/2017 0917   LDLCALC 70 08/07/2017 0830     RADIOLOGY: No results found.   Additional studies/ records that were reviewed today include:  I reviewed the patient's prior echo Doppler studies, remote office visit, and most recent office visit with [facility]    ASSESSMENT:    1. Essential hypertension   2. Chronic systolic heart failure ([place])   3. Left bundle branch block   4. Hyperlipidemia LDL goal <70   5. History of stroke   6. Solitary functioning left kidney with a nonfunctioning right kidney kidney      PLAN:  Mr. Deyton Ellenbecker is an 84 year old gentleman who has a long-standing history of hypertension, and solitary functioning left kidney with a nonfunctioning right kidney.  He has a history of hyperlipidemia, arthritis, BPH and gout.  He continues to be hard-working and currently is working 7 days per week.  He typically is getting only approximately 5-1/2 hours of sleep per night to a maximum of 6.  With his 7 day a week work habit, he is not having opportunity for  sleep catch-up on off weekends.  When I initially saw him, we discussed the need for him to improve sleep duration.  He was remaining essentially asymptomatic but upon further questioning he does admit to some shortness of breath with significant activity.  I reviewed his most recent echo Doppler study which now shows reduced heart function and EF at 20 to 25%.  I believe he has New York Heart Association class II symptoms.  I feel he is a good candidate for initiation of Entresto.  For this reason I will discontinue his Azor combination therapy with amlodipine and olmesartan.  I will give a prescription for on amlodipine 5 mg and will discontinue his ARB olmesartan.  Beginning tomorrow, he will start Entresto 49/51 mg twice daily and I have discussed this with the pharmacist who will see him in 2 weeks to make certain he is tolerating this dose and obtain a repeat bmet and pro-BNP.  Ultimately if blood pressure allows we may be able to titrate him up to 97/103 mg bid when I see him back in follow-up of his pharmacy evaluation.  Once he is on maximum dose Entresto, I will then reassess LV function.  I discussed the potential need for prophylactic ICD today with he and his daughter.  He continues to be on atorvastatin 80 mg daily for hyperlipidemia.  Most recent creatinine was 1.2 on Feb 11, 2018.   Medication Adjustments/Labs and Tests Ordered: Current medicines are reviewed at length with the patient today.  Concerns regarding medicines are outlined above.  Medication changes, Labs and Tests ordered today are listed in the Patient Instructions below. Patient Instructions  Medication Instructions: START Entresto 49-34m twice daily STOP the Amlodipine-olmesartan  START the Amlopdipine 5 mg daily  If you need a refill on your cardiac medications before your next appointment, please call your pharmacy.   Labwork: Your provider would like for you to return in 2 weeks to have the following labs drawn:  BMET. Pro-BNP. You do not need an appointment for the lab. Once in our office lobby there is a podium where you can sign in and ring the doorbell to alert [place]at you are here. The lab is open from 8:00 am to 4:30 pm; closed for lunch from 12:45pm-1:45pm.   Follow-Up: Your physician wants you to follow-up in 2 weeks with pharmd and 6-8 weeks with Dr. KClaiborne Billings  [facility]         Signed, TShelva Majestic MD  03/14/2018 10:03 AM    [place] [place]  [id number]: [phone]

## 2018-03-12 NOTE — Patient Instructions (Signed)
Medication Instructions: START Entresto 49-51mg  twice daily STOP the Amlodipine-olmesartan  START the Amlopdipine 5 mg daily  If you need a refill on your cardiac medications before your next appointment, please call your pharmacy.   Labwork: Your provider would like for you to return in 2 weeks to have the following labs drawn: BMET. Pro-BNP. You do not need an appointment for the lab. Once in our office lobby there is a podium where you can sign in and ring the doorbell to alert [place] that you are here. The lab is open from 8:00 am to 4:30 pm; closed for lunch from 12:45pm-1:45pm.   Follow-Up: Your physician wants you to follow-up in 2 weeks with pharmd and 6-8 weeks with Dr. Claiborne Billings.  Thank you for choosing Heartcare at [facility]!!

## 2018-03-14 ENCOUNTER — Encounter: Payer: Self-pay | Admitting: Cardiovascular Disease

## 2018-03-25 ENCOUNTER — Telehealth: Payer: Self-pay | Admitting: Cardiovascular Disease

## 2018-03-25 NOTE — Telephone Encounter (Signed)
New Message   Pt's daughter is calling, wanting to know if being nauseated is associated with Heart problems. Please call

## 2018-03-25 NOTE — Telephone Encounter (Signed)
Returned call to daughter. Explained she is not on DPR. Explained that I can only speak with patient/wife John Hines. She states that her mom states she is "overreacting". She states patient has been sick on his stomach for a few days and she is alarmed by this. She plans to call back to office when she arrives at her parent's home so verbal authorization to speak with her can be OK'ed by patient.

## 2018-03-27 ENCOUNTER — Ambulatory Visit: Payer: Medicare Other

## 2018-03-27 DIAGNOSIS — I5022 Chronic systolic (congestive) heart failure: Secondary | ICD-10-CM | POA: Diagnosis not present

## 2018-03-27 DIAGNOSIS — I10 Essential (primary) hypertension: Secondary | ICD-10-CM | POA: Diagnosis not present

## 2018-03-27 NOTE — Progress Notes (Deleted)
Patient ID: [name]                 DOB: June 22, 1932                      MRN: [id number]     HPI: John Hines is a 84 y.o. male referred by Dr. Claiborne Hines to pharmacist clinic for medication titration. PMH includes hypertension, syncope, LBBB, heart failure with EF 25%, CKD stage III, BPH, and hyperlipidemia. Patient presents to clinic for HF medication titration.   Carvedilol, Entresto and spironolactone  Current HTN meds:  Amlodipine 5mg  daily Carvedilol 12.5mg  daily ENTRESTO 49-51mg  twice daily  BP goal: 130/80  Family History: reports cancer in father  Social History: denies tobacco use; reports rare alcohol intake   Diet:   Exercise:   Home BP readings:   Wt Readings from Last 3 Encounters:  03/12/18 171 lb (77.6 kg)  02/11/18 171 lb 12.8 oz (77.9 kg)  01/22/18 171 lb (77.6 kg)   BP Readings from Last 3 Encounters:  03/12/18 126/78  02/11/18 136/80  01/22/18 138/78   Pulse Readings from Last 3 Encounters:  03/12/18 64  02/11/18 72  01/22/18 68    Renal function: CrCl cannot be calculated (Patient's most recent lab result is older than the maximum 21 days allowed.).  Past Medical History:  Diagnosis Date  . Arthritis   . BPH (benign prostatic hyperplasia)   . CHF (congestive heart failure) ([place])    Nonischemic Cardiomyopathy. EF 25-35%  . Chronic kidney disease 05/20/2013   CKD 3  . Colon polyps   . Dyslipidemia   . Dysrhythmia 08/19/11   "low heart beat; maybe 25bpm; got RX; now 60bpm"  . ED (erectile dysfunction)   . Gout   . Heart murmur   . Hyperlipidemia   . Hypertension   . Low back pain 08/12/2013  . Nonfunctioning kidney Recognized 03/2006   Chronically and Severely Hydronephrotic and Nonfunctioning Right Kidney  . Solitary kidney Recognized 03/2006   Solitary Functioning Left Kidney  . Stroke [facility])    "mild stroke; dx'd 2010; don't know when it happened"  . Syncope and collapse 08/19/11   "shallow breathing; sweating horribly"     Current Outpatient Medications on File Prior to Visit  Medication Sig Dispense Refill  . amLODipine (NORVASC) 5 MG tablet Take 1 tablet (5 mg total) by mouth daily. 180 tablet 1  . aspirin EC 81 MG tablet Take 81 mg by mouth daily.      Marland Kitchen atorvastatin (LIPITOR) 80 MG tablet Take 1 tablet (80 mg total) by mouth daily. 90 tablet 0  . carvedilol (COREG) 12.5 MG tablet Take 12.5 mg by mouth daily.    . ferrous sulfate 325 (65 FE) MG tablet TAKE 1 TABLET (325 MG TOTAL) BY MOUTH DAILY. 90 tablet 2  . sacubitril-valsartan (ENTRESTO) 49-51 MG Take 1 tablet by mouth 2 (two) times daily. 60 tablet   . sildenafil (VIAGRA) 100 MG tablet Take 1 tablet (100 mg total) by mouth daily as needed for erectile dysfunction. 6 tablet 11   No current facility-administered medications on file prior to visit.     Allergies  Allergen Reactions  . Penicillins Swelling    There were no vitals taken for this visit.  No problem-specific Assessment & Plan notes found for this encounter.  Eldene Plocher Rodriguez-Guzman PharmD, BCPS, [place] [address] [place] [address] 03/27/2018 1:12 PM

## 2018-03-28 LAB — BASIC METABOLIC PANEL
BUN / CREAT RATIO: 10 (ref 10–24)
BUN: 15 mg/dL (ref 8–27)
CO2: 26 mmol/L (ref 20–29)
CREATININE: 1.46 mg/dL — AB (ref 0.76–1.27)
Calcium: 8.8 mg/dL (ref 8.6–10.2)
Chloride: 108 mmol/L — ABNORMAL HIGH (ref 96–106)
GFR calc Af Amer: 50 mL/min/{1.73_m2} — ABNORMAL LOW (ref >59)
GFR calc non Af Amer: 44 mL/min/{1.73_m2} — ABNORMAL LOW (ref >59)
Glucose: 134 mg/dL — ABNORMAL HIGH (ref 65–99)
Potassium: 4.4 mmol/L (ref 3.5–5.2)
SODIUM: 146 mmol/L — AB (ref 134–144)

## 2018-03-28 LAB — PRO B NATRIURETIC PEPTIDE: NT-Pro BNP: 456 pg/mL (ref 0–486)

## 2018-03-30 ENCOUNTER — Other Ambulatory Visit: Payer: Self-pay | Admitting: Physician Assistant

## 2018-04-03 ENCOUNTER — Other Ambulatory Visit: Payer: Self-pay | Admitting: Physician Assistant

## 2018-04-17 ENCOUNTER — Other Ambulatory Visit: Payer: Self-pay | Admitting: Physician Assistant

## 2018-04-23 ENCOUNTER — Ambulatory Visit (INDEPENDENT_AMBULATORY_CARE_PROVIDER_SITE_OTHER): Payer: Medicare Other | Admitting: Pharmacist

## 2018-04-23 VITALS — BP 126/62 | HR 62 | Ht 66.0 in | Wt 168.4 lb

## 2018-04-23 DIAGNOSIS — I5022 Chronic systolic (congestive) heart failure: Secondary | ICD-10-CM

## 2018-04-23 MED ORDER — SACUBITRIL-VALSARTAN 49-51 MG PO TABS: 1.0000 | ORAL_TABLET | Freq: Two times a day (BID) | ORAL | 2 refills | Status: DC

## 2018-04-23 MED ORDER — ATORVASTATIN CALCIUM 80 MG PO TABS: 80.0000 mg | ORAL_TABLET | Freq: Every day | ORAL | 1 refills | Status: DC

## 2018-04-23 NOTE — Patient Instructions (Addendum)
Return for a follow up appointment in 3 weeks with Dr Claiborne Billings  Check your blood pressure at home daily (if able) and keep record of the readings.  Take your BP meds as follows: *RESUME taking Entresto 49-51mg  twice daily* *Blood work today* *Continue lifestyle modification*  Bring all of your meds, your BP cuff and your record of home blood pressures to your next appointment.  Exercise as you're able, try to walk approximately 30 minutes per day.  Keep salt intake to a minimum, especially watch canned and prepared boxed foods.  Eat more fresh fruits and vegetables and fewer canned items.  Avoid eating in fast food restaurants.    HOW TO TAKE YOUR BLOOD PRESSURE: . Rest 5 minutes before taking your blood pressure. .  Don't smoke or drink caffeinated beverages for at least 30 minutes before. . Take your blood pressure before (not after) you eat. . Sit comfortably with your back supported and both feet on the floor (don't cross your legs). . Elevate your arm to heart level on a table or a desk. . Use the proper sized cuff. It should fit smoothly and snugly around your bare upper arm. There should be enough room to slip a fingertip under the cuff. The bottom edge of the cuff should be 1 inch above the crease of the elbow. . Ideally, take 3 measurements at one sitting and record the average.

## 2018-04-23 NOTE — Progress Notes (Signed)
Patient ID: DEADRIAN TOYA                 DOB: 1931/10/03                      MRN: [id number]     HPI: John Hines is a 84 y.o. male referred by Dr. Claiborne Billings to HTN clinic. PMH includes HF with EF 25-35%, CKD-3, hyperlipidemia, and hypertension.  Denies dizziness, stomach discomfort, chest pain, headaches, swelling or increased fatigue. 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 During medication reconciliation was noticed patient is taking ENTRESTO daily instead of twice daily.   Current HTN meds:  Amlodipine 5mg  every evening Carvedilol 12.5mg  every evening ENTRESTO 49-51mg  daily  BP goal: 130/80  Family History:  family history includes Cancer in his father.  Parents are deceased  Social History: denies tobacco; reports occasional alcohol use  Diet: working on low sodium diet  Exercise: activity of daily living  Home BP readings:   Wt Readings from Last 3 Encounters:  04/23/18 168 lb 6.4 oz (76.4 kg)  03/12/18 171 lb (77.6 kg)  02/11/18 171 lb 12.8 oz (77.9 kg)   BP Readings from Last 3 Encounters:  04/23/18 126/62  03/12/18 126/78  02/11/18 136/80   Pulse Readings from Last 3 Encounters:  04/23/18 62  03/12/18 64  02/11/18 72    Past Medical History:  Diagnosis Date  . Arthritis   . BPH (benign prostatic hyperplasia)   . CHF (congestive heart failure) ([place])    Nonischemic Cardiomyopathy. EF 25-35%  . Chronic kidney disease 05/20/2013   CKD 3  . Colon polyps   . Dyslipidemia   . Dysrhythmia 08/19/11   "low heart beat; maybe 25bpm; got RX; now 60bpm"  . ED (erectile dysfunction)   . Gout   . Heart murmur   . Hyperlipidemia   . Hypertension   . Low back pain 08/12/2013  . Nonfunctioning kidney Recognized 03/2006   Chronically and Severely Hydronephrotic and Nonfunctioning Right Kidney  . Solitary kidney Recognized 03/2006   Solitary Functioning Left Kidney  . Stroke [facility])    "mild stroke; dx'd 2010; don't know when  it happened"  . Syncope and collapse 08/19/11   "shallow breathing; sweating horribly"    Current Outpatient Medications on File Prior to Visit  Medication Sig Dispense Refill  . amLODipine (NORVASC) 5 MG tablet Take 1 tablet (5 mg total) by mouth daily. 180 tablet 1  . aspirin EC 81 MG tablet Take 81 mg by mouth daily.      . carvedilol (COREG) 12.5 MG tablet Take 12.5 mg by mouth daily.    . ferrous sulfate 325 (65 FE) MG tablet TAKE 1 TABLET (325 MG TOTAL) BY MOUTH DAILY. 90 tablet 2  . sildenafil (VIAGRA) 100 MG tablet Take 1 tablet (100 mg total) by mouth daily as needed for erectile dysfunction. 6 tablet 11   No current facility-administered medications on file prior to visit.     Allergies  Allergen Reactions  . Penicillins Swelling    Blood pressure 126/62, pulse 62, height 5\' 6"  (1.676 m), weight 168 lb 6.4 oz (76.4 kg).  Chronic systolic heart failure Blood pressure and heart rate remain appropriate for ENTRESTO and carvedilol titration but most recent BMET showed worsening renal function and patient was taking only 1 tablet of ENTRESTO instead of 2 tablets per day. Will repeat BMET today, increase ENTRESTO to 49/51mg  twice daily, and  follow up in 4 weeks for further medication titration if appropriate.   Annisa Mazzarella Rodriguez-Guzman PharmD, BCPS, [place] [address] [place] [address] 04/24/2018 5:29 PM

## 2018-04-24 ENCOUNTER — Encounter: Payer: Self-pay | Admitting: Pharmacist

## 2018-04-24 LAB — BASIC METABOLIC PANEL
BUN/Creatinine Ratio: 11 (ref 10–24)
BUN: 15 mg/dL (ref 8–27)
CALCIUM: 9.1 mg/dL (ref 8.6–10.2)
CO2: 24 mmol/L (ref 20–29)
Chloride: 107 mmol/L — ABNORMAL HIGH (ref 96–106)
Creatinine, Ser: 1.31 mg/dL — ABNORMAL HIGH (ref 0.76–1.27)
GFR calc Af Amer: 57 mL/min/{1.73_m2} — ABNORMAL LOW (ref >59)
GFR, EST NON AFRICAN AMERICAN: 50 mL/min/{1.73_m2} — AB (ref >59)
Glucose: 81 mg/dL (ref 65–99)
POTASSIUM: 4.8 mmol/L (ref 3.5–5.2)
Sodium: 144 mmol/L (ref 134–144)

## 2018-04-24 NOTE — Assessment & Plan Note (Signed)
Blood pressure and heart rate remain appropriate for ENTRESTO and carvedilol titration but most recent BMET showed worsening renal function and patient was taking only 1 tablet of ENTRESTO instead of 2 tablets per day. Will repeat BMET today, increase ENTRESTO to 49/51mg  twice daily, and follow up in 4 weeks for further medication titration if appropriate.

## 2018-04-30 ENCOUNTER — Encounter: Payer: Self-pay | Admitting: *Deleted

## 2018-05-21 ENCOUNTER — Other Ambulatory Visit: Payer: Self-pay

## 2018-05-21 ENCOUNTER — Ambulatory Visit: Payer: Medicare Other | Admitting: Cardiovascular Disease

## 2018-05-21 ENCOUNTER — Encounter: Payer: Self-pay | Admitting: Cardiovascular Disease

## 2018-05-21 VITALS — BP 172/78 | HR 78 | Ht 67.0 in | Wt 169.2 lb

## 2018-05-21 DIAGNOSIS — I5022 Chronic systolic (congestive) heart failure: Secondary | ICD-10-CM | POA: Diagnosis not present

## 2018-05-21 DIAGNOSIS — Z79899 Other long term (current) drug therapy: Secondary | ICD-10-CM

## 2018-05-21 DIAGNOSIS — IMO0002 Reserved for concepts with insufficient information to code with codable children: Secondary | ICD-10-CM

## 2018-05-21 DIAGNOSIS — I447 Left bundle-branch block, unspecified: Secondary | ICD-10-CM | POA: Diagnosis not present

## 2018-05-21 DIAGNOSIS — E785 Hyperlipidemia, unspecified: Secondary | ICD-10-CM

## 2018-05-21 DIAGNOSIS — I10 Essential (primary) hypertension: Secondary | ICD-10-CM

## 2018-05-21 DIAGNOSIS — Q600 Renal agenesis, unilateral: Secondary | ICD-10-CM

## 2018-05-21 MED ORDER — SACUBITRIL-VALSARTAN 97-103 MG PO TABS: 1.0000 | ORAL_TABLET | Freq: Two times a day (BID) | ORAL | 5 refills | Status: DC

## 2018-05-21 NOTE — Progress Notes (Signed)
Cardiology Office Note    Date:  05/23/2018   ID:  John Hines, DOB 14-Nov-1931, MRN [id number]  PCP:  Orlena Sheldon, PA-C  Cardiologist:  Shelva Majestic, MD   F/UEvaluation  History of Present Illness:  John Hines is a 84 y.o. male  who in by me in May 2019 to reestablish cardiology care.  He presents for follow-up evaluation.  John Hines  suffered a right cerebellar ischemic infarction in the territory of the right posterior inferior cerebellar artery remotely. He has a history of hypertension as well as hyperlipidemia. In October 2011 his ejection fraction was 25-35% and subsequent echo Doppler assessment in February 2012 showed an ejection fraction in the range of 35-45%. He had grade 2 diastolic dysfunction with left bundle branch block related to asynchrony and also elevation of left atrial pressures by tissue Doppler. He had mild MR as well as mild aortic sclerosis with mild RV dilatation and mild delayed dilation. RV systolic pressure was 27.  He has been on therapy for hypertension. He  has a solitary functioning left kidney and a nonfunctioning right kidney. There is also a history of dyslipidemia, arthritis, BPH, erectile dysfunction.  He sees Karis Juba for primary care. I had seen him for an initial evaluation in 2012 and after not seeing him in several years he was last seen in February 2015.  In February 2015 an echo Doppler study showed an EF of 35 - 40% and he had grade 1 diastolic dysfunction.  His mild aortic sclerosis with mild AR and trivial MR and TR.  He is 84 years old and remains very active.  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  Starts work at 4 AM and typically is done by 1:59 PM in the afternoon. He tries to go to bed by 9 PM but wakes up at 2:30 AM for work. His wife has stage IV cancer.  He was last seen by Karis Juba in November 2018.  He sees Dr. Lorrene Reid at [facility].  He  has been taking amlodipine/olmesartain 5/40 nadolol 12.5 mg twice a day for hypertension.  He is on atorvastatin 80 mg for hyperlipidemia.  He takes supplemental iron for iron deficiency anemia.  He has a history of gout and takes Uloric 40 mg.  He denies any chest pain.  He denies palpitations.  He is able to cut the grass without symptoms.  When I saw him in May 2 to reestablish cardiology care recommended that he undergo a follow-up echo Doppler study which was done on Jan 29, 2018.  On this study, EF was now 20 to 25%.  There was grade 1 diastolic dysfunction.  He was  relatively asymptomatic but upon further questioning he does note some shortness of breath with activity and most likely has class II heart failure symptoms.  He saw Karis Juba in follow-up.  He was on  with amlodipine/olmesartan 5/40 daily, carvedilol 12.5 mg aspirin and atorvastatin 80 mg.    Last saw him, I recommended discontinuance of Azor combination therapy and placed him on amlodipine 5 mg alone and discontinue the olmesartan.  The following day I recommended initiation of Entresto 49/51 mg twice a day.  He will be seen by our pharmacist tolerating therapy well.  Renal function was stable with a creatinine of 1.3.  He denies any chest pain.  He denies significant shortness of breath.  He essentially is asymptomatic.  He presents for follow-up evaluation and probable dose titration.  Past Medical History:  Diagnosis Date  . Arthritis   . BPH (benign prostatic hyperplasia)   . CHF (congestive heart failure) ([place])    Nonischemic Cardiomyopathy. EF 25-35%  . Chronic kidney disease 05/20/2013   CKD 3  . Colon polyps   . Dyslipidemia   . Dysrhythmia 08/19/11   "low heart beat; maybe 25bpm; got RX; now 60bpm"  . ED (erectile dysfunction)   . Gout   . Heart murmur   . Hyperlipidemia   . Hypertension   . Low back pain 08/12/2013  . Nonfunctioning kidney Recognized 03/2006   Chronically and Severely Hydronephrotic and  Nonfunctioning Right Kidney  . Solitary kidney Recognized 03/2006   Solitary Functioning Left Kidney  . Stroke [facility])    "mild stroke; dx'd 2010; don't know when it happened"  . Syncope and collapse 08/19/11   "shallow breathing; sweating horribly"    Past Surgical History:  Procedure Laterality Date  . CARDIOVASCULAR STRESS TEST  08/30/2010   Small to moderate fixed inferoseptal defect. Severe global hypokinesis with inferior akinesis and septal dyskinesis-likely secondary to a prominent LBBB. Non-diagnostic for ischemia due to persantine.  Marland Kitchen KNEE ARTHROSCOPY  1996   right  . TRANSTHORACIC ECHOCARDIOGRAM  08/20/2011   EF 45-50%, severe concentric hyperthrophy.    Current Medications: Outpatient Medications Prior to Visit  Medication Sig Dispense Refill  . amLODipine (NORVASC) 5 MG tablet Take 1 tablet (5 mg total) by mouth daily. 180 tablet 1  . aspirin EC 81 MG tablet Take 81 mg by mouth daily.      Marland Kitchen atorvastatin (LIPITOR) 80 MG tablet Take 1 tablet (80 mg total) by mouth daily. 90 tablet 1  . carvedilol (COREG) 12.5 MG tablet Take 12.5 mg by mouth daily.    . ferrous sulfate 325 (65 FE) MG tablet TAKE 1 TABLET (325 MG TOTAL) BY MOUTH DAILY. 90 tablet 2  . sildenafil (VIAGRA) 100 MG tablet Take 1 tablet (100 mg total) by mouth daily as needed for erectile dysfunction. 6 tablet 11  . sacubitril-valsartan (ENTRESTO) 49-51 MG Take 1 tablet by mouth 2 (two) times daily. 60 tablet 2   No facility-administered medications prior to visit.      Allergies:   Penicillins   Social History   Socioeconomic History  . Marital status: Married    Spouse name: Not on file  . Number of children: Not on file  . Years of education: Not on file  . Highest education level: Not on file  Occupational History  . Not on file  Social Needs  . Financial resource strain: Not on file  . Food insecurity:    Worry: Not on file    Inability: Not on file  . Transportation needs:    Medical: Not on  file    Non-medical: Not on file  Tobacco Use  . Smoking status: Never Smoker  . Smokeless tobacco: Never Used  Substance and Sexual Activity  . Alcohol use: Yes    Comment: "social drinker; last time 08/15/11"  . Drug use: No  . Sexual activity: Yes  Lifestyle  . Physical activity:    Days per week: Not on file    Minutes per session: Not on file  . Stress: Not on file  Relationships  . Social connections:    Talks on phone: Not on file    Gets together: Not on file    Attends  religious service: Not on file    Active member of club or organization: Not on file    Attends meetings of clubs or organizations: Not on file    Relationship status: Not on file  Other Topics Concern  . Not on file  Social History Narrative  . Not on file    He is married.  There is no tobacco history.  Family History:  The patient's family history includes Cancer in his father.  Parents are deceased  ROS General: Negative; No fevers, chills, or night sweats;  HEENT: Negative; No changes in vision or hearing, sinus congestion, difficulty swallowing Pulmonary: Negative; No cough, wheezing, shortness of breath, hemoptysis Cardiovascular: Negative; No chest pain, presyncope, syncope, palpitations GI: Negative; No nausea, vomiting, diarrhea, or abdominal pain GU: Negative; No dysuria, hematuria, or difficulty voiding Musculoskeletal: Negative; no myalgias, joint pain, or weakness Hematologic/Oncology: Negative; no easy bruising, bleeding Endocrine: Negative; no heat/cold intolerance; no diabetes Neuro: Negative; no changes in balance, headaches Skin: Negative; No rashes or skin lesions Psychiatric: Negative; No behavioral problems, depression Sleep: Negative; No snoring, daytime sleepiness, hypersomnolence, bruxism, restless legs, hypnogognic hallucinations, no cataplexy Other comprehensive 14 point system review is negative.   PHYSICAL EXAM:   VS:  BP (!) 172/78   Pulse 78   Ht _0  (1.702  m)   Wt 169 lb 3.2 oz (76.7 kg)   BMI 26.50 kg/m     BP by me 148/78  Wt Readings from Last 3 Encounters:  05/21/18 169 lb 3.2 oz (76.7 kg)  04/23/18 168 lb 6.4 oz (76.4 kg)  03/12/18 171 lb (77.6 kg)      Physical Exam BP (!) 172/78   Pulse 78   Ht _1  (1.702 m)   Wt 169 lb 3.2 oz (76.7 kg)   BMI 26.50 kg/m  General: Alert, oriented, no distress.  Skin: normal turgor, no rashes, warm and dry HEENT: Normocephalic, atraumatic. Pupils equal round and reactive to light; sclera anicteric; extraocular muscles intact;  Nose without nasal septal hypertrophy Mouth/Parynx benign; Mallinpatti scale 3 Neck: No JVD, no carotid bruits; normal carotid upstroke Lungs: clear to ausculatation and percussion; no wheezing or rales Chest wall: without tenderness to palpitation Heart: PMI not displaced, RRR, s1 s2 normal, 1/6 systolic murmur, no diastolic murmur, no rubs, gallops, thrills, or heaves Abdomen: soft, nontender; no hepatosplenomehaly, BS+; abdominal aorta nontender and not dilated by palpation. Back: no CVA tenderness Pulses 2+ Musculoskeletal: full range of motion, normal strength, no joint deformities Extremities: no clubbing cyanosis or edema, Homan's sign negative  Neurologic: grossly nonfocal; Cranial nerves grossly wnl Psychologic: Normal mood and affectt   Studies/Labs Reviewed:   EKG:  EKG is ordered today.  ECG (independently read by me): Normal sinus rhythm at 78 bpm.  PAC.  Left bundle branch block with repolarization changes.  March 12, 2018 ECG (independently read by me): Normal sinus rhythm at 64 bpm.  Left bundle branch block with repolarization changes.  QTc interval 470 ms.  No ectopy.  May 10/2017 ECG (independently read by me): Normal sinus rhythm with a PAC.  Left bundle branch block with repolarization changes.  PR interval 170 ms, QTc interval _2 ms.  February 2015 ECG (independently read by me): Sinus rhythm at 67 beats per minute with left  bundle-branch block and associated repolarization changes. No ectopy.  LABS: Recent Labs: BMP Latest Ref Rng & Units 04/23/2018 03/27/2018 02/11/2018  Glucose 65 - 99 mg/dL 81 134(H) 99  BUN 8 -  27 mg/dL _0 Creatinine 0.76 - 1.27 mg/dL 1.31(H) 1.46(H) 1.20(H)  BUN/Creat Ratio 10 - _1 Sodium 134 - 144 mmol/L 144 146(H) 142  Potassium 3.5 - 5.2 mmol/L 4.8 4.4 4.5  Chloride 96 - 106 mmol/L 107(H) 108(H) 109  CO2 20 - 29 mmol/L _2 Calcium 8.6 - 10.2 mg/dL 9.1 8.8 8.9     Hepatic Function Latest Ref Rng & Units 02/11/2018 08/07/2017 02/06/2017  Total Protein 6.1 - 8.1 g/dL 6.2 6.6 6.5  Albumin 3.6 - 5.1 g/dL - - 4.1  AST 10 - 35 U/L _3 ALT 9 - 46 U/L _4 Alk Phosphatase 40 - 115 U/L - - 95  Total Bilirubin 0.2 - 1.2 mg/dL 0.7 0.8 0.7    CBC Latest Ref Rng & Units 08/07/2017 02/06/2017 09/08/2015  WBC 3.8 - 10.8 Thousand/uL 4.3 4.1 4.8  Hemoglobin 13.2 - 17.1 g/dL 13.1(L) 12.8(L) 12.9(L)  Hematocrit 38.5 - 50.0 % 38.0(L) 39.0 37.2(L)  Platelets 140 - 400 Thousand/uL 215 186 195   Lab Results  Component Value Date   MCV 80.5 08/07/2017   MCV 82.3 02/06/2017   MCV 79.7 09/08/2015   Lab Results  Component Value Date   TSH 3.82 02/06/2017   Lab Results  Component Value Date   HGBA1C 6.8 (H) 02/11/2018     BNP No results found for: BNP  ProBNP    Component Value Date/Time   PROBNP 456 03/27/2018 1349   PROBNP 145.8 08/19/2011 1831     Lipid Panel     Component Value Date/Time   CHOL 133 08/07/2017 0830   TRIG 49 08/07/2017 0830   HDL 49 08/07/2017 0830   CHOLHDL 2.7 08/07/2017 0830   VLDL 13 02/06/2017 0917   LDLCALC 70 08/07/2017 0830     RADIOLOGY: No results found.   Additional studies/ records that were reviewed today include:  I reviewed the patient's prior echo Doppler studies, remote office visit, and most recent office visit with [facility]    ASSESSMENT:    1. Chronic systolic heart failure ([place])     2. Essential hypertension   3. Hyperlipidemia LDL goal <70   4. Medication management   5. Left bundle branch block   6. Solitary functioning left kidney with a nonfunctioning right kidney kidney      PLAN:  John Hines is an 84 year old gentleman who has a long-standing history of hypertension, and solitary functioning left kidney with a nonfunctioning right kidney.  He has a history of hyperlipidemia, arthritis, BPH and gout.  He is hard-working and currently is working 7 days per week.  He typically is getting only approximately 5-1/2 hours of sleep per night to a maximum of 6.  With his 7 day a week work habit, he is not having opportunity for sleep catch-up on off weekends.  When I initially saw him, we discussed the need for him to improve sleep duration.  He was remaining essentially asymptomatic but upon further questioning he admitted to some shortness of breath with significant activity.  His most recent echo Doppler study which  shows reduced heart function and EF at 20 to 25%.  I saw him I felt he was a good candidate for Entresto initiation and had probable New [place] Heart Association class II symptoms.  I initiated Entresto at 49/51 mg and he has been on this therapy and feeling well.  His previous mild shortness  of breath has essentially resolved.  His blood pressure today remains adequate for further titration and is still slightly elevated.  I will now increase Entresto to 97/103 mg twice a day.  Repeat laboratory will be obtained including chemistry in addition to proBNP.  She is fully titrated to maximum therapy Entresto he may then be a candidate for further titration of carvedilol and possible initiation of spironolactone but I will not do this presently particularly with his solitary kidney .  Once on maximal therapy he will ultimately undergo follow-up echo cardiographic evaluation for reassessment of LV function.  He has chronic left bundle branch block.  If LV function   remains depressed we discussed potential prophylactic ICD  Implantation.  I will seee him in 2 months for follow-up evaluation   Medication Adjustments/Labs and Tests Ordered: Current medicines are reviewed at length with the patient today.  Concerns regarding medicines are outlined above.  Medication changes, Labs and Tests ordered today are listed in the Patient Instructions below. Patient Instructions  Medication Instructions:  INCREASE Entresto to 97/103 mg two times daily  Labwork: Please return for FASTING labs in 2-3weeks (CMET, Lipid, ProBNP)  Our in office lab hours are Monday-Friday 8:00-4:00, closed for lunch 12:45-1:45 pm.  No appointment needed.  Follow-Up: 10/28 at 1140 with Dr. Claiborne Billings  Any Other Special Instructions Will Be Listed Below (If Applicable).     If you need a refill on your cardiac medications before your next appointment, please call your pharmacy.      Signed, Shelva Majestic, MD  05/23/2018 1:47 PM    [facility] [address] [place], [place], [place]  [address] Phone: [phone]

## 2018-05-21 NOTE — Patient Instructions (Signed)
Medication Instructions:  INCREASE Entresto to 97/103 mg two times daily  Labwork: Please return for FASTING labs in 2-3weeks (CMET, Lipid, ProBNP)  Our in office lab hours are Monday-Friday 8:00-4:00, closed for lunch 12:45-1:45 pm.  No appointment needed.  Follow-Up: 10/28 at 1140 with Dr. Claiborne Billings  Any Other Special Instructions Will Be Listed Below (If Applicable).     If you need a refill on your cardiac medications before your next appointment, please call your pharmacy.

## 2018-05-21 NOTE — Patient Outreach (Signed)
[facility]) Care Management  05/21/2018  MUATH HALLAM [birth year] [id number]   Medication Adherence call to Mr. Kristoffer Bala patient  is showing past due on Amlodipine/Olmesartan 5/40 mg spoke with patient he said he pick up this medication in June and has about another month go patient wont be due until September. Mr. Marple is showing past due under [place].  [facility] Direct Dial [phone]  Fax [phone] [email or web address]

## 2018-05-23 ENCOUNTER — Encounter: Payer: Self-pay | Admitting: Cardiovascular Disease

## 2018-06-12 ENCOUNTER — Telehealth: Payer: Self-pay | Admitting: Physician Assistant

## 2018-06-12 DIAGNOSIS — N529 Male erectile dysfunction, unspecified: Secondary | ICD-10-CM

## 2018-06-12 MED ORDER — SILDENAFIL CITRATE 100 MG PO TABS: 100.0000 mg | ORAL_TABLET | Freq: Every day | ORAL | 11 refills | Status: DC | PRN

## 2018-06-12 NOTE — Telephone Encounter (Signed)
Refill on Viagra to [facility].

## 2018-06-12 NOTE — Telephone Encounter (Signed)
rx sent to pharmacy

## 2018-06-17 ENCOUNTER — Emergency Department (HOSPITAL_COMMUNITY)
Admission: EM | Admit: 2018-06-17 | Discharge: 2018-06-17 | Disposition: A | Discharge status: Home / Self Care | Payer: Medicare Other | Attending: Emergency Medicine | Admitting: Emergency Medicine

## 2018-06-17 ENCOUNTER — Emergency Department (HOSPITAL_COMMUNITY): Payer: Medicare Other

## 2018-06-17 ENCOUNTER — Encounter (HOSPITAL_COMMUNITY): Payer: Self-pay

## 2018-06-17 DIAGNOSIS — I499 Cardiac arrhythmia, unspecified: Secondary | ICD-10-CM | POA: Diagnosis not present

## 2018-06-17 DIAGNOSIS — R55 Syncope and collapse: Secondary | ICD-10-CM | POA: Diagnosis not present

## 2018-06-17 DIAGNOSIS — I5022 Chronic systolic (congestive) heart failure: Secondary | ICD-10-CM | POA: Insufficient documentation

## 2018-06-17 DIAGNOSIS — N183 Chronic kidney disease, stage 3 (moderate): Secondary | ICD-10-CM | POA: Insufficient documentation

## 2018-06-17 DIAGNOSIS — E876 Hypokalemia: Secondary | ICD-10-CM | POA: Diagnosis not present

## 2018-06-17 DIAGNOSIS — Y999 Unspecified external cause status: Secondary | ICD-10-CM | POA: Diagnosis not present

## 2018-06-17 DIAGNOSIS — R0902 Hypoxemia: Secondary | ICD-10-CM | POA: Diagnosis not present

## 2018-06-17 DIAGNOSIS — I130 Hypertensive heart and chronic kidney disease with heart failure and stage 1 through stage 4 chronic kidney disease, or unspecified chronic kidney disease: Secondary | ICD-10-CM | POA: Insufficient documentation

## 2018-06-17 DIAGNOSIS — Y9289 Other specified places as the place of occurrence of the external cause: Secondary | ICD-10-CM | POA: Diagnosis not present

## 2018-06-17 DIAGNOSIS — Z79899 Other long term (current) drug therapy: Secondary | ICD-10-CM | POA: Diagnosis not present

## 2018-06-17 DIAGNOSIS — R42 Dizziness and giddiness: Secondary | ICD-10-CM | POA: Diagnosis not present

## 2018-06-17 DIAGNOSIS — Y9389 Activity, other specified: Secondary | ICD-10-CM | POA: Diagnosis not present

## 2018-06-17 DIAGNOSIS — R000 Tachycardia, unspecified: Secondary | ICD-10-CM | POA: Diagnosis not present

## 2018-06-17 DIAGNOSIS — Z7982 Long term (current) use of aspirin: Secondary | ICD-10-CM | POA: Insufficient documentation

## 2018-06-17 DIAGNOSIS — X58XXXA Exposure to other specified factors, initial encounter: Secondary | ICD-10-CM | POA: Diagnosis not present

## 2018-06-17 DIAGNOSIS — T18128A Food in esophagus causing other injury, initial encounter: Secondary | ICD-10-CM

## 2018-06-17 DIAGNOSIS — Z743 Need for continuous supervision: Secondary | ICD-10-CM | POA: Diagnosis not present

## 2018-06-17 DIAGNOSIS — I4891 Unspecified atrial fibrillation: Secondary | ICD-10-CM | POA: Diagnosis present

## 2018-06-17 LAB — BASIC METABOLIC PANEL
Anion gap: 7 (ref 5–15)
BUN: 18 mg/dL (ref 8–23)
CHLORIDE: 111 mmol/L (ref 98–111)
CO2: 24 mmol/L (ref 22–32)
Calcium: 8.6 mg/dL — ABNORMAL LOW (ref 8.9–10.3)
Creatinine, Ser: 1.57 mg/dL — ABNORMAL HIGH (ref 0.61–1.24)
GFR calc non Af Amer: 39 mL/min — ABNORMAL LOW (ref >60)
GFR, EST AFRICAN AMERICAN: 45 mL/min — AB (ref >60)
Glucose, Bld: 164 mg/dL — ABNORMAL HIGH (ref 70–99)
Potassium: 3.1 mmol/L — ABNORMAL LOW (ref 3.5–5.1)
Sodium: 142 mmol/L (ref 135–145)

## 2018-06-17 LAB — CBC
HEMATOCRIT: 38.9 % — AB (ref 39.0–52.0)
Hemoglobin: 12.6 g/dL — ABNORMAL LOW (ref 13.0–17.0)
MCH: 27.7 pg (ref 26.0–34.0)
MCHC: 32.4 g/dL (ref 30.0–36.0)
MCV: 85.5 fL (ref 78.0–100.0)
Platelets: 161 10*3/uL (ref 150–400)
RBC: 4.55 MIL/uL (ref 4.22–5.81)
RDW: 13.2 % (ref 11.5–15.5)
WBC: 5.7 10*3/uL (ref 4.0–10.5)

## 2018-06-17 LAB — I-STAT TROPONIN, ED: Troponin i, poc: 0.03 ng/mL (ref 0.00–0.08)

## 2018-06-17 MED ORDER — POTASSIUM CHLORIDE 20 MEQ/15ML (10%) PO SOLN
40.0000 meq | Freq: Two times a day (BID) | ORAL | Status: DC
  Administered 2018-06-17: 40 meq via ORAL
  Filled 2018-06-17: qty 30

## 2018-06-17 NOTE — ED Provider Notes (Signed)
[facility] EMERGENCY DEPARTMENT Provider Note   CSN: [id number] Arrival date & time: 06/17/18  2033     History   Chief Complaint Chief Complaint  Patient presents with  . Atrial Fibrillation    HPI John Hines is a 84 y.o. male.  HPI   Patient is an 84 year old male with PMHx of HFrEF (EF 25-35%), CVA, HTN, and CKD 3 who presents with palpitations and presyncope this evening that began after getting a piece of chicken stuck in his throat.  He was unable to pass the chicken with water and subsequently vomited clear fluid.  He became diaphoretic and "foggy feeling" as though he would syncopize.  Upon EMS arrival he was tachycardic to 170s and hypotensive however had improvement after IVF bolus.  Patient states the chicken then spontaneously passed and he became asymptomatic.  His heart rate improved to 90s.  Of note he has had significant stress in his life as his wife passed away this morning.  Currently he is asymptomatic and reports feeling much better.  Otherwise a normal state of health.  Past Medical History:  Diagnosis Date  . Arthritis   . BPH (benign prostatic hyperplasia)   . CHF (congestive heart failure) ([place])    Nonischemic Cardiomyopathy. EF 25-35%  . Chronic kidney disease 05/20/2013   CKD 3  . Colon polyps   . Dyslipidemia   . Dysrhythmia 08/19/11   "low heart beat; maybe 25bpm; got RX; now 60bpm"  . ED (erectile dysfunction)   . Gout   . Heart murmur   . Hyperlipidemia   . Hypertension   . Low back pain 08/12/2013  . Nonfunctioning kidney Recognized 03/2006   Chronically and Severely Hydronephrotic and Nonfunctioning Right Kidney  . Solitary kidney Recognized 03/2006   Solitary Functioning Left Kidney  . Stroke [facility])    "mild stroke; dx'd 2010; don't know when it happened"  . Syncope and collapse 08/19/11   "shallow breathing; sweating horribly"    Patient Active Problem List   Diagnosis Date Noted  . Iron deficiency anemia  08/07/2017  . BPH (benign prostatic hyperplasia) 03/21/2016  . Low back pain 08/12/2013  . Eczema 08/12/2013  . Chronic kidney disease   . Solitary kidney   . Nonfunctioning kidney   . CHF (congestive heart failure) ([place])   . Colon polyps   . Chronic systolic heart failure ([place]) 03/11/2013  . Gout 03/11/2013  . Syncope 08/21/2011  . Chronic kidney disease, stage III (moderate) ([place]) 08/21/2011  . Hyperglycemia 08/21/2011  . Left bundle branch block 08/21/2011  . Hyperlipidemia   . Hypertension   . Dyslipidemia     Past Surgical History:  Procedure Laterality Date  . CARDIOVASCULAR STRESS TEST  08/30/2010   Small to moderate fixed inferoseptal defect. Severe global hypokinesis with inferior akinesis and septal dyskinesis-likely secondary to a prominent LBBB. Non-diagnostic for ischemia due to persantine.  Marland Kitchen KNEE ARTHROSCOPY  1996   right  . TRANSTHORACIC ECHOCARDIOGRAM  08/20/2011   EF 45-50%, severe concentric hyperthrophy.        Home Medications    Prior to Admission medications   Medication Sig Start Date End Date Taking? Authorizing Provider  amLODipine (NORVASC) 5 MG tablet Take 1 tablet (5 mg total) by mouth daily. 03/12/18 06/17/18 Yes Troy Sine, MD  aspirin EC 81 MG tablet Take 81 mg by mouth daily.     Yes [provider]  carvedilol (COREG) 12.5 MG tablet Take 12.5 mg by mouth  daily.   Yes [provider]  cholecalciferol (VITAMIN D) 1000 units tablet Take 1,000 Units by mouth daily.   Yes [provider]  ferrous sulfate 325 (65 FE) MG tablet TAKE 1 TABLET (325 MG TOTAL) BY MOUTH DAILY. 03/30/18  Yes Orlena Sheldon, PA-C  Omega-3 Fatty Acids (FISH OIL) 1000 MG CAPS Take 1,000 mg by mouth daily.   Yes [provider]  sacubitril-valsartan (ENTRESTO) 97-103 MG Take 1 tablet by mouth 2 (two) times daily. 05/21/18  Yes Troy Sine, MD  sildenafil (VIAGRA) 100 MG tablet Take 1 tablet (100 mg total) by mouth daily as needed for  erectile dysfunction. 06/12/18  Yes Dena Billet B, PA-C  atorvastatin (LIPITOR) 80 MG tablet Take 1 tablet (80 mg total) by mouth daily. Patient not taking: Reported on 06/17/2018 04/23/18   Troy Sine, MD    Family History Family History  Problem Relation Age of Onset  . Cancer Father     Social History Social History   Tobacco Use  . Smoking status: Never Smoker  . Smokeless tobacco: Never Used  Substance Use Topics  . Alcohol use: Yes    Comment: "social drinker; last time 08/15/11"  . Drug use: No     Allergies   Penicillins   Review of Systems Review of Systems  Constitutional: Positive for diaphoresis. Negative for chills and fever.  HENT: Negative for sore throat and trouble swallowing.   Eyes: Negative for pain and visual disturbance.  Respiratory: Negative for cough and shortness of breath.   Cardiovascular: Negative for chest pain and palpitations.  Gastrointestinal: Negative for abdominal pain, nausea and vomiting.  Genitourinary: Negative for dysuria and hematuria.  Musculoskeletal: Negative for arthralgias and back pain.  Skin: Negative for color change and rash.  Neurological: Positive for light-headedness. Negative for seizures and syncope.  All other systems reviewed and are negative.    Physical Exam Updated Vital Signs BP 132/78 (BP Location: Right Arm)   Pulse 89   Temp 97.7 F (36.5 C) (Oral)   Resp 16   Ht 5\' 7"  (1.702 m)   Wt 77.6 kg   SpO2 97%   BMI 26.78 kg/m   Physical Exam  Constitutional: He is oriented to person, place, and time. He appears well-developed and well-nourished. No distress.  HENT:  Head: Normocephalic and atraumatic.  Mouth/Throat: Oropharynx is clear and moist.  Eyes: Pupils are equal, round, and reactive to light. Conjunctivae and EOM are normal.  Neck: Normal range of motion. Neck supple.  Cardiovascular: Normal rate, regular rhythm and intact distal pulses.  Pulmonary/Chest: Effort normal and breath sounds  normal. No respiratory distress.  Abdominal: Soft. He exhibits no distension. There is no tenderness. There is no guarding.  Musculoskeletal: Normal range of motion. He exhibits no edema.  Neurological: He is alert and oriented to person, place, and time.  Skin: Skin is warm and dry. Capillary refill takes less than 2 seconds. No rash noted.  Psychiatric: He has a normal mood and affect.  Nursing note and vitals reviewed.    ED Treatments / Results  Labs (all labs ordered are listed, but only abnormal results are displayed) Labs Reviewed  BASIC METABOLIC PANEL - Abnormal; Notable for the following components:      Result Value   Potassium 3.1 (*)    Glucose, Bld 164 (*)    Creatinine, Ser 1.57 (*)    Calcium 8.6 (*)    GFR calc non Af Amer 39 (*)  GFR calc Af Amer 45 (*)    All other components within normal limits  CBC - Abnormal; Notable for the following components:   Hemoglobin 12.6 (*)    HCT 38.9 (*)    All other components within normal limits  I-STAT TROPONIN, ED    EKG EKG Interpretation  Date/Time:  Wednesday June 17 2018 20:50:04 EDT Ventricular Rate:  100 PR Interval:    QRS Duration: 156 QT Interval:  400 QTC Calculation: 516 R Axis:   -71 Text Interpretation:  Sinus tachycardia Atrial premature complexes in couplets Left bundle branch block rate slower compared to earlier in the day Confirmed by Sherwood Gambler [phone]) on 06/17/2018 9:08:48 PM Also confirmed by Sherwood Gambler [phone]), editor Lynder Parents [phone])  on 06/18/2018 9:37:10 AM   Radiology Dg Chest 2 View  Result Date: 06/17/2018 CLINICAL DATA:  Tachycardia after awakening EXAM: CHEST - 2 VIEW COMPARISON:  08/19/2011 FINDINGS: Stable cardiomegaly with aortic atherosclerosis. Slight elevation of the right hemidiaphragm is chronic and stable in appearance. No pulmonary consolidation, effusion or pneumothorax. Mild degenerative change noted along the dorsal spine. IMPRESSION: No active  cardiopulmonary disease. Redemonstration of mild cardiomegaly and aortic atherosclerosis without significant change. Electronically Signed   By: Ashley Royalty M.D.   On: 06/17/2018 22:04    Procedures Procedures (including critical care time)  Medications Ordered in ED Medications - No data to display   Initial Impression / Assessment and Plan / ED Course  I have reviewed the triage vital signs and the nursing notes.  Pertinent labs & imaging results that were available during my care of the patient were reviewed by me and considered in my medical decision making (see chart for details).    Patient is an 84 year old male with PMHx of HFrEF (EF 25-35%), CVA, HTN, and CKD 3 who presents with palpitations and presyncope this evening that began after getting a piece of chicken stuck in his throat.  Chicken was passed spontaneously and symptoms resolved.  On arrival he is HDS and well/nontoxic appearing.  Exam as above unremarkable.  Initial EKG with sinus tachycardia to 130s with LVH however repeat 1 hour later without intervention shows slower rate at 100.  LBBB present.  No evidence of acute ischemic changes, abnormal intervals, or dysrhythmia.  No change when compared to prior from 05/21/18.  CXR without acute cardiopulmonary process.  Labs obtain significant for mild Cr elevation of 1.57 (baseline 1.20-1.30) and hypokalemia (3.1) which is likely 2/2 poor p.o. Intake.  Etiology of tachycardia and presyncope likely 2/2 food bolus impaction that resolved spontaneously and significant stress as patient's wife passed away this morning.  Doubt ACS as troponin negative, EKG without acute ischemia, and patient without chest pain.  No falls reported. No PTX or pneumonia on CXR.  Patient stable for d/c home.  Old records reviewed.  Imaging and labs reviewed and interpreted by myself and attending and used in the MDM.  Addressed patient question and concerns.  Reviewed discharged instructions with strict  precautions given.  Advised patient to schedule follow-up with primary care provider.  Patient verbalized understanding and agrees with plan.  Patient stable at discharge.  The plan for this patient was discussed with Dr. Regenia Skeeter who voiced agreement and who oversaw evaluation and treatment of this patient.   Final Clinical Impressions(s) / ED Diagnoses   Final diagnoses:  Hypokalemia  Pre-syncope  Food impaction of esophagus, initial encounter    ED Discharge Orders    None  Fabian November, MD 06/19/18 [id number]    Sherwood Gambler, MD 06/22/18 [phone]

## 2018-06-17 NOTE — ED Notes (Signed)
PT states understanding of care given, follow up care. PT ambulated from ED to car with a steady gait.  

## 2018-06-17 NOTE — Discharge Instructions (Addendum)
Follow up with primary care doctor in 2 days.  Return to ED for any worsening or other concerns.  Drink plenty of fluids and stick to a soft diet over the next few days.

## 2018-06-17 NOTE — ED Triage Notes (Signed)
Pt has been stressed today due to pt wife passing away this morning, pt woke up and started to have fast heart rate and felt he was going to pass out. Pt called ems. When ems arrived heart rate was in the 170's and bp was dropped to the 70's. Ems gave some fluids and was able to be palpated. Pt heart rate on arrival went up to 150's and then dropped to 96.

## 2018-07-09 DIAGNOSIS — I5022 Chronic systolic (congestive) heart failure: Secondary | ICD-10-CM | POA: Diagnosis not present

## 2018-07-09 DIAGNOSIS — Z79899 Other long term (current) drug therapy: Secondary | ICD-10-CM | POA: Diagnosis not present

## 2018-07-09 DIAGNOSIS — I10 Essential (primary) hypertension: Secondary | ICD-10-CM | POA: Diagnosis not present

## 2018-07-09 DIAGNOSIS — E785 Hyperlipidemia, unspecified: Secondary | ICD-10-CM | POA: Diagnosis not present

## 2018-07-10 LAB — COMPREHENSIVE METABOLIC PANEL
ALK PHOS: 97 IU/L (ref 39–117)
ALT: 12 IU/L (ref 0–44)
AST: 14 IU/L (ref 0–40)
Albumin/Globulin Ratio: 1.8 (ref 1.2–2.2)
Albumin: 4 g/dL (ref 3.5–4.7)
BUN/Creatinine Ratio: 10 (ref 10–24)
BUN: 13 mg/dL (ref 8–27)
Bilirubin Total: 0.7 mg/dL (ref 0.0–1.2)
CO2: 22 mmol/L (ref 20–29)
Calcium: 9 mg/dL (ref 8.6–10.2)
Chloride: 108 mmol/L — ABNORMAL HIGH (ref 96–106)
Creatinine, Ser: 1.35 mg/dL — ABNORMAL HIGH (ref 0.76–1.27)
GFR calc Af Amer: 55 mL/min/{1.73_m2} — ABNORMAL LOW (ref >59)
GFR, EST NON AFRICAN AMERICAN: 48 mL/min/{1.73_m2} — AB (ref >59)
GLOBULIN, TOTAL: 2.2 g/dL (ref 1.5–4.5)
Glucose: 69 mg/dL (ref 65–99)
Potassium: 4.2 mmol/L (ref 3.5–5.2)
SODIUM: 147 mmol/L — AB (ref 134–144)
Total Protein: 6.2 g/dL (ref 6.0–8.5)

## 2018-07-10 LAB — LIPID PANEL
CHOLESTEROL TOTAL: 134 mg/dL (ref 100–199)
Chol/HDL Ratio: 2.7 ratio (ref 0.0–5.0)
HDL: 49 mg/dL (ref >39)
LDL Calculated: 76 mg/dL (ref 0–99)
TRIGLYCERIDES: 47 mg/dL (ref 0–149)
VLDL Cholesterol Cal: 9 mg/dL (ref 5–40)

## 2018-07-10 LAB — PRO B NATRIURETIC PEPTIDE: NT-Pro BNP: 1127 pg/mL — ABNORMAL HIGH (ref 0–486)

## 2018-07-20 ENCOUNTER — Ambulatory Visit: Payer: Medicare Other | Admitting: Cardiovascular Disease

## 2018-07-20 ENCOUNTER — Encounter: Payer: Self-pay | Admitting: Cardiovascular Disease

## 2018-07-20 VITALS — BP 140/70 | HR 75 | Ht 67.0 in | Wt 171.0 lb

## 2018-07-20 DIAGNOSIS — Q600 Renal agenesis, unilateral: Secondary | ICD-10-CM | POA: Diagnosis not present

## 2018-07-20 DIAGNOSIS — I10 Essential (primary) hypertension: Secondary | ICD-10-CM | POA: Diagnosis not present

## 2018-07-20 DIAGNOSIS — I5042 Chronic combined systolic (congestive) and diastolic (congestive) heart failure: Secondary | ICD-10-CM

## 2018-07-20 DIAGNOSIS — Z79899 Other long term (current) drug therapy: Secondary | ICD-10-CM

## 2018-07-20 DIAGNOSIS — IMO0002 Reserved for concepts with insufficient information to code with codable children: Secondary | ICD-10-CM

## 2018-07-20 DIAGNOSIS — I447 Left bundle-branch block, unspecified: Secondary | ICD-10-CM | POA: Diagnosis not present

## 2018-07-20 MED ORDER — CARVEDILOL 6.25 MG PO TABS: 6.2500 mg | ORAL_TABLET | Freq: Two times a day (BID) | ORAL | 3 refills | Status: DC

## 2018-07-20 MED ORDER — SPIRONOLACTONE 25 MG PO TABS: 12.5000 mg | ORAL_TABLET | Freq: Every day | ORAL | 3 refills | Status: DC

## 2018-07-20 NOTE — Patient Instructions (Signed)
Medication Instructions:  START spironolactone 12.5 mg daily  Restart carvedilol (Coreg) 6.25 mg two times daily If you need a refill on your cardiac medications before your next appointment, please call your pharmacy.   Lab work: Please return for  labs in 4 weeks (BMET, [facility])  Our in office lab hours are Monday-Friday 8:00-4:00, closed for lunch 12:45-1:45 pm.  No appointment needed.  If you have labs (blood work) drawn today and your tests are completely normal, you will receive your results only by: Marland Kitchen MyChart Message (if you have MyChart) OR . A paper copy in the mail If you have any lab test that is abnormal or we need to change your treatment, we will call you to review the results.  Testing/Procedures: Your physician has requested that you have an echocardiogram in 6 weeks. Echocardiography is a painless test that uses sound waves to create images of your heart. It provides your doctor with information about the size and shape of your heart and how well your heart's chambers and valves are working. This procedure takes approximately one hour. There are no restrictions for this procedure.  This will be done at our [facility] location:  [place]: 1/6 at 10:40 AM with Dr. Claiborne Billings

## 2018-07-20 NOTE — Addendum Note (Signed)
Addended by: Patria Mane A on: 07/20/2018 01:13 PM   Modules accepted: Orders

## 2018-07-20 NOTE — Progress Notes (Signed)
 Cardiology Office Note    Date:  07/20/2018   ID:  John Hines, DOB 04/06/1932, MRN [id number]  PCP:  Dixon, Mary B, PA-C  Cardiologist:  Gonsalo Kelly, MD   F/UEvaluation  History of Present Illness:  John Hines is a 84 y.o. male  who in by me in May 2019 to reestablish cardiology care.  Subsequently seen him for follow-up evaluations and last saw him in August 2019.  He presents for a 2-month follow-up evaluation.  John Hines  suffered a right cerebellar ischemic infarction in the territory of the right posterior inferior cerebellar artery remotely. He has a history of hypertension as well as hyperlipidemia. In October 2011 his ejection fraction was 25-35% and subsequent echo Doppler assessment in February 2012 showed an ejection fraction in the range of 35-45%. He had grade 2 diastolic dysfunction with left bundle branch block related to asynchrony and also elevation of left atrial pressures by tissue Doppler. He had mild MR as well as mild aortic sclerosis with mild RV dilatation and mild delayed dilation. RV systolic pressure was 27.  He has been on therapy for hypertension. He  has a solitary functioning left kidney and a nonfunctioning right kidney. There is also a history of dyslipidemia, arthritis, BPH, erectile dysfunction.  He sees Mary Beth Dixon for primary care. I had seen him for an initial evaluation in 2012 and after not seeing him in several years he was last seen in February 2015.  In February 2015 an echo Doppler study showed an EF of 35 - 40% and he had grade 1 diastolic dysfunction.  His mild aortic sclerosis with mild AR and trivial MR and TR.  He is 84 years old and remains very active.  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  Starts work at 4 AM and typically is done by 1:59 PM in the afternoon. He tries to go to bed by 9 PM but wakes up at 2:30 AM for work. His wife has stage IV cancer.  He was  last seen by Mary Beth Dixon in November 2018.  He sees Dr. Dunham at [facility] only.  He has been taking amlodipine/olmesartain 5/40 nadolol 12.5 mg twice a day for hypertension.  He is on atorvastatin 80 mg for hyperlipidemia.  He takes supplemental iron for iron deficiency anemia.  He has a history of gout and takes Uloric 40 mg.  He denies any chest pain.  He denies palpitations.  He is able to cut the grass without symptoms.  When I saw him in May 2019 to reestablish cardiology care I recommended that he undergo a follow-up echo Doppler study which was done on Jan 29, 2018.  On this study, EF was now 20 to 25%.  There was grade 1 diastolic dysfunction.  He was  relatively asymptomatic but upon further questioning he does note some shortness of breath with activity and most likely has class II heart failure symptoms.  He saw Mary Beth Dixon in follow-up.  He was on  with amlodipine/olmesartan 5/40 daily, carvedilol 12.5 mg aspirin and atorvastatin 80 mg.    I recommended discontinuance of Azor combination therapy and placed him on amlodipine 5 mg alone and discontinued olmesartan.  The following day I recommended initiation of Entresto 49/51 mg twice a day.  He was seen by a pharmacist in follow-up.  Since I last saw him, unfortunately his wife passed away in September   2019.  He had taken some time off work after her death but has resumed his daily work routine.  At some time since I last seen him he had reduced his carvedilol and was only taking once a day.  He was further titrated to Entresto 97/103 mg twice daily.  Several days ago he ran out of his carvedilol.  2 weeks ago repeat laboratory showed further increase in NT proBNP at 1127 which had increased from 456 3 months previously.  Presently, he denies chest pain.  He denies palpitations.  He denies presyncope or syncope.  He presents for reevaluation and is here with his daughter in great grandchild.   Past Medical History:  Diagnosis Date   . Arthritis   . BPH (benign prostatic hyperplasia)   . CHF (congestive heart failure) ([place])    Nonischemic Cardiomyopathy. EF 25-35%  . Chronic kidney disease 05/20/2013   CKD 3  . Colon polyps   . Dyslipidemia   . Dysrhythmia 08/19/11   "low heart beat; maybe 25bpm; got RX; now 60bpm"  . ED (erectile dysfunction)   . Gout   . Heart murmur   . Hyperlipidemia   . Hypertension   . Low back pain 08/12/2013  . Nonfunctioning kidney Recognized 03/2006   Chronically and Severely Hydronephrotic and Nonfunctioning Right Kidney  . Solitary kidney Recognized 03/2006   Solitary Functioning Left Kidney  . Stroke [facility])    "mild stroke; dx'd 2010; don't know when it happened"  . Syncope and collapse 08/19/11   "shallow breathing; sweating horribly"    Past Surgical History:  Procedure Laterality Date  . CARDIOVASCULAR STRESS TEST  08/30/2010   Small to moderate fixed inferoseptal defect. Severe global hypokinesis with inferior akinesis and septal dyskinesis-likely secondary to a prominent LBBB. Non-diagnostic for ischemia due to persantine.  Marland Kitchen KNEE ARTHROSCOPY  1996   right  . TRANSTHORACIC ECHOCARDIOGRAM  08/20/2011   EF 45-50%, severe concentric hyperthrophy.    Current Medications: Outpatient Medications Prior to Visit  Medication Sig Dispense Refill  . aspirin EC 81 MG tablet Take 81 mg by mouth daily.      . cholecalciferol (VITAMIN D) 1000 units tablet Take 1,000 Units by mouth daily.    . ferrous sulfate 325 (65 FE) MG tablet TAKE 1 TABLET (325 MG TOTAL) BY MOUTH DAILY. 90 tablet 2  . Omega-3 Fatty Acids (FISH OIL) 1000 MG CAPS Take 1,000 mg by mouth daily.    . sacubitril-valsartan (ENTRESTO) 97-103 MG Take 1 tablet by mouth 2 (two) times daily. 60 tablet 5  . sildenafil (VIAGRA) 100 MG tablet Take 1 tablet (100 mg total) by mouth daily as needed for erectile dysfunction. 6 tablet 11  . carvedilol (COREG) 12.5 MG tablet Take 12.5 mg by mouth daily.    Marland Kitchen amLODipine (NORVASC) 5 MG  tablet Take 1 tablet (5 mg total) by mouth daily. 180 tablet 1  . atorvastatin (LIPITOR) 80 MG tablet Take 1 tablet (80 mg total) by mouth daily. (Patient not taking: Reported on 06/17/2018) 90 tablet 1   No facility-administered medications prior to visit.      Allergies:   Penicillins   Social History   Socioeconomic History  . Marital status: Married    Spouse name: Not on file  . Number of children: Not on file  . Years of education: Not on file  . Highest education level: Not on file  Occupational History  . Not on file  Social Needs  . Financial resource strain:  Not on file  . Food insecurity:    Worry: Not on file    Inability: Not on file  . Transportation needs:    Medical: Not on file    Non-medical: Not on file  Tobacco Use  . Smoking status: Never Smoker  . Smokeless tobacco: Never Used  Substance and Sexual Activity  . Alcohol use: Yes    Comment: "social drinker; last time 08/15/11"  . Drug use: No  . Sexual activity: Yes  Lifestyle  . Physical activity:    Days per week: Not on file    Minutes per session: Not on file  . Stress: Not on file  Relationships  . Social connections:    Talks on phone: Not on file    Gets together: Not on file    Attends religious service: Not on file    Active member of club or organization: Not on file    Attends meetings of clubs or organizations: Not on file    Relationship status: Not on file  Other Topics Concern  . Not on file  Social History Narrative  . Not on file    He is married.  There is no tobacco history.  Family History:  The patient's family history includes Cancer in his father.  Parents are deceased  ROS General: Negative; No fevers, chills, or night sweats;  HEENT: Negative; No changes in vision or hearing, sinus congestion, difficulty swallowing Pulmonary: Negative; No cough, wheezing, shortness of breath, hemoptysis Cardiovascular: Negative; No chest pain, presyncope, syncope,  palpitations GI: Negative; No nausea, vomiting, diarrhea, or abdominal pain GU: Negative; No dysuria, hematuria, or difficulty voiding Musculoskeletal: Negative; no myalgias, joint pain, or weakness Hematologic/Oncology: Negative; no easy bruising, bleeding Endocrine: Negative; no heat/cold intolerance; no diabetes Neuro: Negative; no changes in balance, headaches Skin: Negative; No rashes or skin lesions Psychiatric: Negative; No behavioral problems, depression Sleep: Negative; No snoring, daytime sleepiness, hypersomnolence, bruxism, restless legs, hypnogognic hallucinations, no cataplexy Other comprehensive 14 point system review is negative.   PHYSICAL EXAM:   VS:  BP 140/70   Pulse 75   Ht 5' 7" (1.702 m)   Wt 171 lb (77.6 kg)   BMI 26.78 kg/m     Repeat blood pressure by me was 132/70  Wt Readings from Last 3 Encounters:  07/20/18 171 lb (77.6 kg)  06/17/18 171 lb (77.6 kg)  05/21/18 169 lb 3.2 oz (76.7 kg)    General: Alert, oriented, no distress.  Skin: normal turgor, no rashes, warm and dry HEENT: Normocephalic, atraumatic. Pupils equal round and reactive to light; sclera anicteric; extraocular muscles intact;  Nose without nasal septal hypertrophy Mouth/Parynx benign; Mallinpatti scale 3 Neck: No JVD, no carotid bruits; normal carotid upstroke Lungs: clear to ausculatation and percussion; no wheezing or rales Chest wall: without tenderness to palpitation Heart: PMI not displaced, RRR, s1 s2 normal, 1/6 systolic murmur, no diastolic murmur, no rubs, gallops, thrills, or heaves Abdomen: soft, nontender; no hepatosplenomehaly, BS+; abdominal aorta nontender and not dilated by palpation. Back: no CVA tenderness Pulses 2+ Musculoskeletal: full range of motion, normal strength, no joint deformities Extremities: no clubbing cyanosis or edema, Homan's sign negative  Neurologic: grossly nonfocal; Cranial nerves grossly wnl Psychologic: Normal mood and  affect   Studies/Labs Reviewed:   EKG:  EKG is ordered today.  ECG (independently read by me): Sinus rhythm at 75 bpm with PAC.  Right axis deviation.  Left bundle branch block.  QTc interval 42 ms.  PR interval 166   ms.  May 21, 2018 ECG (independently read by me): Normal sinus rhythm at 78 bpm.  PAC.  Left bundle branch block with repolarization changes.  March 12, 2018 ECG (independently read by me): Normal sinus rhythm at 64 bpm.  Left bundle branch block with repolarization changes.  QTc interval 470 ms.  No ectopy.  May 10/2017 ECG (independently read by me): Normal sinus rhythm with a PAC.  Left bundle branch block with repolarization changes.  PR interval 170 ms, QTc interval 4 6 9 ms.  February 2015 ECG (independently read by me): Sinus rhythm at 67 beats per minute with left bundle-branch block and associated repolarization changes. No ectopy.  LABS: Recent Labs: BMP Latest Ref Rng & Units 07/09/2018 06/17/2018 04/23/2018  Glucose 65 - 99 mg/dL 69 164(H) 81  BUN 8 - 27 mg/dL 13 18 15  Creatinine 0.76 - 1.27 mg/dL 1.35(H) 1.57(H) 1.31(H)  BUN/Creat Ratio 10 - 24 10 - 11  Sodium 134 - 144 mmol/L 147(H) 142 144  Potassium 3.5 - 5.2 mmol/L 4.2 3.1(L) 4.8  Chloride 96 - 106 mmol/L 108(H) 111 107(H)  CO2 20 - 29 mmol/L 22 24 24  Calcium 8.6 - 10.2 mg/dL 9.0 8.6(L) 9.1     Hepatic Function Latest Ref Rng & Units 07/09/2018 02/11/2018 08/07/2017  Total Protein 6.0 - 8.5 g/dL 6.2 6.2 6.6  Albumin 3.5 - 4.7 g/dL 4.0 - -  AST 0 - 40 IU/L 14 17 13  ALT 0 - 44 IU/L 12 16 11  Alk Phosphatase 39 - 117 IU/L 97 - -  Total Bilirubin 0.0 - 1.2 mg/dL 0.7 0.7 0.8    CBC Latest Ref Rng & Units 06/17/2018 08/07/2017 02/06/2017  WBC 4.0 - 10.5 K/uL 5.7 4.3 4.1  Hemoglobin 13.0 - 17.0 g/dL 12.6(L) 13.1(L) 12.8(L)  Hematocrit 39.0 - 52.0 % 38.9(L) 38.0(L) 39.0  Platelets 150 - 400 K/uL 161 215 186   Lab Results  Component Value Date   MCV 85.5 06/17/2018   MCV 80.5 08/07/2017   MCV 82.3  02/06/2017   Lab Results  Component Value Date   TSH 3.82 02/06/2017   Lab Results  Component Value Date   HGBA1C 6.8 (H) 02/11/2018     BNP No results found for: BNP  ProBNP    Component Value Date/Time   PROBNP 1,127 (H) 07/09/2018 1403   PROBNP 145.8 08/19/2011 1831     Lipid Panel     Component Value Date/Time   CHOL 134 07/09/2018 1403   TRIG 47 07/09/2018 1403   HDL 49 07/09/2018 1403   CHOLHDL 2.7 07/09/2018 1403   CHOLHDL 2.7 08/07/2017 0830   VLDL 13 02/06/2017 0917   LDLCALC 76 07/09/2018 1403   LDLCALC 70 08/07/2017 0830     RADIOLOGY: No results found.   Additional studies/ records that were reviewed today include:  I reviewed the patient's prior echo Doppler studies, remote office visit, and most recent office visit with Mary Beth Dixon,PAC    ASSESSMENT:    1. Chronic combined systolic and diastolic heart failure (HCC)   2. Chronic systolic heart failure (HCC)   3. Medication management   4. Essential hypertension   5. Left bundle branch block   6. Solitary functioning left kidney with a nonfunctioning right kidney kidney      PLAN:  John Hines is an 84-year-old gentleman who has a long-standing history of hypertension, and solitary functioning left kidney with a nonfunctioning right kidney.  He has a history   of hyperlipidemia, arthritis, BPH and gout.  He is hard-working and works  7 days per week.  He admitted to mild shortness of breath with significant activity and an echo Doppler study performed in May 2019 showed a reduction of LV function to 20 to 25% with grade 1 diastolic dysfunction which was new from his 2015 echo.  Over the past several months he is gradually been initiated on Entresto and has been titrated up to maximum dose at 97/103 mg twice a day.  He had been on carvedilol 12.5 mg twice a day but apparently was told subsequently to reduce to daily.  He ran out of carvedilol for the last several days.  He continues to  take amlodipine 5 mg daily.  Most recent laboratory has shown an increase in fatigue proBNP at 1127 which is increased from 456 of 3 months previously.  I am adding spironolactone 12.5 mg daily to his medical regimen and he will need close follow-up of renal function.  I will also reinitiate carvedilol but at 6.25 mg dosing so that he will take this twice a day.  In 4 weeks he will undergo repeat be met and proBNP level.  In 6 weeks I am recommending he undergo a follow-up echo Doppler study to reassess his LV function and I will see him in the office in 2 months for reevaluation.  If LV function remains significantly reduced I will refer him for ICD consideration.   Medication Adjustments/Labs and Tests Ordered: Current medicines are reviewed at length with the patient today.  Concerns regarding medicines are outlined above.  Medication changes, Labs and Tests ordered today are listed in the Patient Instructions below. Patient Instructions  Medication Instructions:  START spironolactone 12.5 mg daily  Restart carvedilol (Coreg) 6.25 mg two times daily If you need a refill on your cardiac medications before your next appointment, please call your pharmacy.   Lab work: Please return for  labs in 4 weeks (BMET, ProBnp)  Our in office lab hours are Monday-Friday 8:00-4:00, closed for lunch 12:45-1:45 pm.  No appointment needed.  If you have labs (blood work) drawn today and your tests are completely normal, you will receive your results only by: Marland Kitchen MyChart Message (if you have MyChart) OR . A paper copy in the mail If you have any lab test that is abnormal or we need to change your treatment, we will call you to review the results.  Testing/Procedures: Your physician has requested that you have an echocardiogram in 6 weeks. Echocardiography is a painless test that uses sound waves to create images of your heart. It provides your doctor with information about the size and shape of your heart and  how well your heart's chambers and valves are working. This procedure takes approximately one hour. There are no restrictions for this procedure.  This will be done at our [facility] location:  [place]: 1/6 at 10:40 AM with Dr. Claiborne Billings     Signed, Shelva Majestic, MD  07/20/2018 12:43 PM    [facility] [address], [place], [place], [place]  [address] Phone: [phone]

## 2018-08-12 ENCOUNTER — Telehealth: Payer: Self-pay | Admitting: Cardiovascular Disease

## 2018-08-12 NOTE — Telephone Encounter (Signed)
° ° °  Patient calling the office for samples of medication: donut hole   1.  What medication and dosage are you requesting samples for?sacubitril-valsartan (ENTRESTO) 97-103 MG  2.  Are you currently out of this medication? Yes

## 2018-08-12 NOTE — Telephone Encounter (Signed)
Returned call to patient's daughter Marijean Heath.Stated Delene Loll too expensive.Advised no samples available.Advised I will send message to Dr.Kelly's RN for [facility] patient assistance.

## 2018-08-17 ENCOUNTER — Ambulatory Visit: Payer: Medicare Other | Admitting: Physician Assistant

## 2018-08-17 DIAGNOSIS — I5042 Chronic combined systolic (congestive) and diastolic (congestive) heart failure: Secondary | ICD-10-CM | POA: Diagnosis not present

## 2018-08-18 LAB — PRO B NATRIURETIC PEPTIDE: NT-Pro BNP: 282 pg/mL (ref 0–486)

## 2018-08-18 LAB — BRAIN NATRIURETIC PEPTIDE: BNP: 67.7 pg/mL (ref 0.0–100.0)

## 2018-08-26 ENCOUNTER — Other Ambulatory Visit: Payer: Self-pay

## 2018-08-26 NOTE — Patient Outreach (Signed)
[facility]) Care Management  08/26/2018  [name] 09-01-1932 [id number]   Medication Adherence call to John Hines patient did not answer patient is due on Atorvastatin 80 mg. [name] is showing past due under [place].   [facility] Management Direct Dial [phone]  Fax [phone] [email or web address]

## 2018-08-27 ENCOUNTER — Ambulatory Visit: Payer: Medicare Other | Admitting: Family Medicine

## 2018-08-31 ENCOUNTER — Ambulatory Visit (HOSPITAL_COMMUNITY): Payer: Medicare Other | Attending: Cardiology

## 2018-08-31 ENCOUNTER — Other Ambulatory Visit: Payer: Self-pay

## 2018-08-31 DIAGNOSIS — I5042 Chronic combined systolic (congestive) and diastolic (congestive) heart failure: Secondary | ICD-10-CM | POA: Diagnosis not present

## 2018-09-02 NOTE — Telephone Encounter (Signed)
Left message for daughter to call back in regards to patient assistance for   [facility] # [phone]

## 2018-09-14 ENCOUNTER — Other Ambulatory Visit: Payer: Self-pay | Admitting: Cardiovascular Disease

## 2018-09-22 ENCOUNTER — Other Ambulatory Visit: Payer: Self-pay

## 2018-09-24 ENCOUNTER — Telehealth: Payer: Self-pay | Admitting: Family Medicine

## 2018-09-24 NOTE — Telephone Encounter (Signed)
Pt's daughter Marijean Heath (on dpr) called office requesting a message to be sent to Dr.Duncan to see if he can take her father in as a new pt. She said that her mother Kwabena Strutz who recently passed away was a pt of Dr.Duncan's and that she feels Dr.Duncan would take really great care of her father.

## 2018-09-25 NOTE — Telephone Encounter (Signed)
Pt scheduled 10/01/2018 @ 3:15

## 2018-09-25 NOTE — Telephone Encounter (Signed)
I would be glad to see him.  I routed this note to his PCP to make sure it was okay with him. Assuming the transfer is okay, then please schedule patient for a 30-minute visit here.  Thanks.

## 2018-09-25 NOTE — Telephone Encounter (Addendum)
See below, please schedule 30 min visit.  Thanks.

## 2018-09-25 NOTE — Telephone Encounter (Signed)
Sure, I haven't seen him since 2016.  I think he was seeing my PA who retired.

## 2018-09-28 ENCOUNTER — Encounter: Payer: Self-pay | Admitting: Cardiovascular Disease

## 2018-09-28 ENCOUNTER — Ambulatory Visit: Payer: Medicare Other | Admitting: Cardiovascular Disease

## 2018-09-28 VITALS — BP 132/64 | HR 67 | Ht 67.5 in | Wt 174.2 lb

## 2018-09-28 DIAGNOSIS — I447 Left bundle-branch block, unspecified: Secondary | ICD-10-CM

## 2018-09-28 DIAGNOSIS — R9431 Abnormal electrocardiogram [ECG] [EKG]: Secondary | ICD-10-CM | POA: Diagnosis not present

## 2018-09-28 DIAGNOSIS — I5042 Chronic combined systolic (congestive) and diastolic (congestive) heart failure: Secondary | ICD-10-CM | POA: Diagnosis not present

## 2018-09-28 DIAGNOSIS — Q600 Renal agenesis, unilateral: Secondary | ICD-10-CM | POA: Diagnosis not present

## 2018-09-28 DIAGNOSIS — E785 Hyperlipidemia, unspecified: Secondary | ICD-10-CM

## 2018-09-28 DIAGNOSIS — I10 Essential (primary) hypertension: Secondary | ICD-10-CM

## 2018-09-28 DIAGNOSIS — IMO0002 Reserved for concepts with insufficient information to code with codable children: Secondary | ICD-10-CM

## 2018-09-28 NOTE — Patient Instructions (Signed)
Medication Instructions:  The current medical regimen is effective;  continue present plan and medications.  If you need a refill on your cardiac medications before your next appointment, please call your pharmacy.   Follow-Up: At [facility], you and your health needs are our priority.  As part of our continuing mission to provide you with exceptional heart care, we have created designated Provider Care Teams.  These Care Teams include your primary Cardiologist (physician) and Advanced Practice Providers (APPs -  Physician Assistants and Nurse Practitioners) who all work together to provide you with the care you need, when you need it. You will need a follow up appointment in 4-6 months.  Please call our office 2 months in advance to schedule this appointment.  You may see Shelva Majestic, MD or one of the following Advanced Practice Providers on your designated Care Team: [place], [place] . Fabian Sharp, PA-C  Any Other Special Instructions Will Be Listed Below (If Applicable). Referral to EP

## 2018-09-28 NOTE — Progress Notes (Signed)
Cardiology Office Note    Date:  09/28/2018   ID:  John Hines, DOB 1932-07-26, MRN [id number]  PCP:  Susy Frizzle, MD  Cardiologist:  Shelva Majestic, MD   F/U Evaluation  History of Present Illness:  John Hines is a 85 y.o. male  who was seen by me in May 2019 to reestablish cardiology care.   Seen in October 2019 and presents for follow-up evaluation.  John Hines  suffered a right cerebellar ischemic infarction in the territory of the right posterior inferior cerebellar artery remotely. He has a history of hypertension as well as hyperlipidemia. In October 2011 his ejection fraction was 25-35% and subsequent echo Doppler assessment in February 2012 showed an ejection fraction in the range of 35-45%. He had grade 2 diastolic dysfunction with left bundle branch block related to asynchrony and also elevation of left atrial pressures by tissue Doppler. He had mild MR as well as mild aortic sclerosis with mild RV dilatation and mild delayed dilation. RV systolic pressure was 27.  He has been on therapy for hypertension. He  has a solitary functioning left kidney and a nonfunctioning right kidney. There is also a history of dyslipidemia, arthritis, BPH, erectile dysfunction.  He sees John Hines for primary care. I had seen him for an initial evaluation in 2012 and after not seeing him in several years he was last seen in February 2015.  In February 2015 an echo Doppler study showed an EF of 35 - 40% and he had grade 1 diastolic dysfunction.  His mild aortic sclerosis with mild AR and trivial MR and TR.  He is 85 years old and remains very active.  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  Starts work at 4 AM and typically is done by 1:59 PM in the afternoon. He tries to go to bed by 9 PM but wakes up at 2:30 AM for work. His wife has stage IV cancer.  He was last seen by John Hines in November 2018.  He sees John Hines at [facility].  He has been taking amlodipine/olmesartain 5/40 nadolol 12.5 mg twice a day for hypertension.  He is on atorvastatin 80 mg for hyperlipidemia.  He takes supplemental iron for iron deficiency anemia.  He has a history of gout and takes Uloric 40 mg.  He denies any chest pain.  He denies palpitations.  He is able to cut the grass without symptoms.  When I saw him in May 2019 to reestablish cardiology care I recommended that he undergo a follow-up echo Doppler study which was done on Jan 29, 2018.  On this study, EF was now 20 to 25%.  There was grade 1 diastolic dysfunction.  He was  relatively asymptomatic but upon further questioning he does note some shortness of breath with activity and most likely has class II heart failure symptoms.  He saw John Hines in follow-up.  He was on  with amlodipine/olmesartan 5/40 daily, carvedilol 12.5 mg aspirin and atorvastatin 80 mg.    I recommended discontinuance of Azor combination therapy and placed him on amlodipine 5 mg alone and discontinued olmesartan.  The following day I recommended initiation of Entresto 49/51 mg twice a day.  He was seen by a pharmacist in follow-up.  Since I last saw him, unfortunately his wife passed away in Jun 05, 2018,  2 days after their 57th wedding anniversary.  He had taken some time off work after her death but has resumed his daily work routine.  At some time since I last seen him he had reduced his carvedilol and was only taking once a day.  He was further titrated to Entresto 97/103 mg twice daily.  I last saw him on July 20, 2018 and 2 days previously he had run out of his carvedilol.  Laboratory 2 weeks prior to that evaluation  showed further increase in NT proBNP at 1127 which had increased from 456, 3 months previously.    When I last saw him, I added spironolactone 12.5 mg daily to his medical regimen and reinitiated carvedilol at 6.25 mg twice daily dosing.  4 weeks later follow-up  laboratory showed marked improvement with a proBNP at 282 and a BNP at 67.  Over the past several months he feels exceptional.  He denies chest pain PND orthopnea.  He denies significant shortness of breath and only experiences some mild dyspnea with significant exertion.  He denies any palpitations.  He underwent a repeat echo Doppler study on August 31, 2018 which showed improvement in LV function and EF was now 30 to 35%.  There was grade 1 diastolic dysfunction, mild aortic valve sclerosis and mild LA dilation.  Left ventricular dimensions were 56 mm at end diastole and 46 mm at end systole.  He continues to work 7 days/week alternating with 6 days/week every other week.  He essentially is asymptomatic.  He presents for reevaluation  Past Medical History:  Diagnosis Date  . Arthritis   . BPH (benign prostatic hyperplasia)   . CHF (congestive heart failure) ([place])    Nonischemic Cardiomyopathy. EF 25-35%  . Chronic kidney disease 05/20/2013   CKD 3  . Colon polyps   . Dyslipidemia   . Dysrhythmia 08/19/11   "low heart beat; maybe 25bpm; got RX; now 60bpm"  . ED (erectile dysfunction)   . Gout   . Heart murmur   . Hyperlipidemia   . Hypertension   . Low back pain 08/12/2013  . Nonfunctioning kidney Recognized 03/2006   Chronically and Severely Hydronephrotic and Nonfunctioning Right Kidney  . Solitary kidney Recognized 03/2006   Solitary Functioning Left Kidney  . Stroke [facility])    "mild stroke; dx'd 2010; don't know when it happened"  . Syncope and collapse 08/19/11   "shallow breathing; sweating horribly"    Past Surgical History:  Procedure Laterality Date  . CARDIOVASCULAR STRESS TEST  08/30/2010   Small to moderate fixed inferoseptal defect. Severe global hypokinesis with inferior akinesis and septal dyskinesis-likely secondary to a prominent LBBB. Non-diagnostic for ischemia due to persantine.  Marland Kitchen KNEE ARTHROSCOPY  1996   right  . TRANSTHORACIC ECHOCARDIOGRAM  08/20/2011   EF  45-50%, severe concentric hyperthrophy.    Current Medications: Outpatient Medications Prior to Visit  Medication Sig Dispense Refill  . aspirin EC 81 MG tablet Take 81 mg by mouth daily.      Marland Kitchen atorvastatin (LIPITOR) 80 MG tablet TAKE 1 TABLET BY MOUTH EVERY DAY 90 tablet 0  . carvedilol (COREG) 6.25 MG tablet Take 1 tablet (6.25 mg total) by mouth 2 (two) times daily with a meal. 180 tablet 3  . cholecalciferol (VITAMIN D) 1000 units tablet Take 1,000 Units by mouth daily.    . ferrous sulfate 325 (65 FE) MG tablet TAKE 1 TABLET (325 MG TOTAL) BY MOUTH DAILY. 90 tablet 2  . Omega-3 Fatty Acids (FISH OIL) 1000 MG CAPS Take 1,000  mg by mouth daily.    . sacubitril-valsartan (ENTRESTO) 97-103 MG Take 1 tablet by mouth 2 (two) times daily. 60 tablet 5  . sildenafil (VIAGRA) 100 MG tablet Take 1 tablet (100 mg total) by mouth daily as needed for erectile dysfunction. 6 tablet 11  . spironolactone (ALDACTONE) 25 MG tablet Take 0.5 tablets (12.5 mg total) by mouth daily. 45 tablet 3  . amLODipine (NORVASC) 5 MG tablet Take 1 tablet (5 mg total) by mouth daily. 180 tablet 1   No facility-administered medications prior to visit.      Allergies:   Penicillins   Social History   Socioeconomic History  . Marital status: Married    Spouse name: Not on file  . Number of children: Not on file  . Years of education: Not on file  . Highest education level: Not on file  Occupational History  . Not on file  Social Needs  . Financial resource strain: Not on file  . Food insecurity:    Worry: Not on file    Inability: Not on file  . Transportation needs:    Medical: Not on file    Non-medical: Not on file  Tobacco Use  . Smoking status: Never Smoker  . Smokeless tobacco: Never Used  Substance and Sexual Activity  . Alcohol use: Yes    Comment: "social drinker; last time 08/15/11"  . Drug use: No  . Sexual activity: Yes  Lifestyle  . Physical activity:    Days per week: Not on file     Minutes per session: Not on file  . Stress: Not on file  Relationships  . Social connections:    Talks on phone: Not on file    Gets together: Not on file    Attends religious service: Not on file    Active member of club or organization: Not on file    Attends meetings of clubs or organizations: Not on file    Relationship status: Not on file  Other Topics Concern  . Not on file  Social History Narrative  . Not on file    He is married.  There is no tobacco history.  Family History:  The patient's family history includes Cancer in his father.  Parents are deceased  ROS General: Negative; No fevers, chills, or night sweats;  HEENT: Negative; No changes in vision or hearing, sinus congestion, difficulty swallowing Pulmonary: Negative; No cough, wheezing, shortness of breath, hemoptysis Cardiovascular: Negative; No chest pain, presyncope, syncope, palpitations GI: Negative; No nausea, vomiting, diarrhea, or abdominal pain GU: Negative; No dysuria, hematuria, or difficulty voiding Musculoskeletal: Negative; no myalgias, joint pain, or weakness Hematologic/Oncology: Negative; no easy bruising, bleeding Endocrine: Negative; no heat/cold intolerance; no diabetes Neuro: Negative; no changes in balance, headaches Skin: Negative; No rashes or skin lesions Psychiatric: Negative; No behavioral problems, depression Sleep: Negative; No snoring, daytime sleepiness, hypersomnolence, bruxism, restless legs, hypnogognic hallucinations, no cataplexy Other comprehensive 14 point system review is negative.  PHYSICAL EXAM:   VS:  BP 132/64   Pulse 67   Ht 5' 7.5" (1.715 m)   Wt 174 lb 3.2 oz (79 kg)   BMI 26.88 kg/m     Repeat blood pressure by me was 124/70  Wt Readings from Last 3 Encounters:  09/28/18 174 lb 3.2 oz (79 kg)  07/20/18 171 lb (77.6 kg)  06/17/18 171 lb (77.6 kg)   General: Alert, oriented, no distress.  Skin: normal turgor, no rashes, warm and dry HEENT: Normocephalic,  atraumatic. Pupils equal round and reactive to light; sclera anicteric; extraocular muscles intact;  Nose without nasal septal hypertrophy Mouth/Parynx benign; Mallinpatti scale 3 Neck: No JVD, no carotid bruits; normal carotid upstroke Lungs: clear to ausculatation and percussion; no wheezing or rales Chest wall: without tenderness to palpitation Heart: PMI not displaced, RRR, s1 s2 normal, 1/6 systolic murmur, no diastolic murmur, no rubs, gallops, thrills, or heaves Abdomen: soft, nontender; no hepatosplenomehaly, BS+; abdominal aorta nontender and not dilated by palpation. Back: no CVA tenderness Pulses 2+ Musculoskeletal: full range of motion, normal strength, no joint deformities Extremities: no clubbing cyanosis or edema, Homan's sign negative  Neurologic: grossly nonfocal; Cranial nerves grossly wnl Psychologic: Normal mood and affect   Studies/Labs Reviewed:   ECG (independently read by me): Normal sinus rhythm at 67 bpm.  Left bundle branch block with repolarization changes. QRS duration148 ms left axis deviation.  July 20, 2018 ECG (independently read by me): Sinus rhythm at 75 bpm with PAC.  Right axis deviation.  Left bundle branch block.  QTc interval 42 ms.  PR interval 166 ms.  May 21, 2018 ECG (independently read by me): Normal sinus rhythm at 78 bpm.  PAC.  Left bundle branch block with repolarization changes.  March 12, 2018 ECG (independently read by me): Normal sinus rhythm at 64 bpm.  Left bundle branch block with repolarization changes.  QTc interval 470 ms.  No ectopy.  May 10/2017 ECG (independently read by me): Normal sinus rhythm with a PAC.  Left bundle branch block with repolarization changes.  PR interval 170 ms, QTc interval _0 ms.  February 2015 ECG (independently read by me): Sinus rhythm at 67 beats per minute with left bundle-branch block and associated repolarization changes. No ectopy.  LABS: Recent Labs: BMP Latest Ref Rng & Units  07/09/2018 06/17/2018 04/23/2018  Glucose 65 - 99 mg/dL 69 164(H) 81  BUN 8 - 27 mg/dL _1 Creatinine 0.76 - 1.27 mg/dL 1.35(H) 1.57(H) 1.31(H)  BUN/Creat Ratio 10 - 24 10 - 11  Sodium 134 - 144 mmol/L 147(H) 142 144  Potassium 3.5 - 5.2 mmol/L 4.2 3.1(L) 4.8  Chloride 96 - 106 mmol/L 108(H) 111 107(H)  CO2 20 - 29 mmol/L _2 Calcium 8.6 - 10.2 mg/dL 9.0 8.6(L) 9.1     Hepatic Function Latest Ref Rng & Units 07/09/2018 02/11/2018 08/07/2017  Total Protein 6.0 - 8.5 g/dL 6.2 6.2 6.6  Albumin 3.5 - 4.7 g/dL 4.0 - -  AST 0 - 40 IU/L _3 ALT 0 - 44 IU/L _4 Alk Phosphatase 39 - 117 IU/L 97 - -  Total Bilirubin 0.0 - 1.2 mg/dL 0.7 0.7 0.8    CBC Latest Ref Rng & Units 06/17/2018 08/07/2017 02/06/2017  WBC 4.0 - 10.5 K/uL 5.7 4.3 4.1  Hemoglobin 13.0 - 17.0 g/dL 12.6(L) 13.1(L) 12.8(L)  Hematocrit 39.0 - 52.0 % 38.9(L) 38.0(L) 39.0  Platelets 150 - 400 K/uL 161 215 186   Lab Results  Component Value Date   MCV 85.5 06/17/2018   MCV 80.5 08/07/2017   MCV 82.3 02/06/2017   Lab Results  Component Value Date   TSH 3.82 02/06/2017   Lab Results  Component Value Date   HGBA1C 6.8 (H) 02/11/2018     BNP    Component Value Date/Time   BNP 67.7 08/17/2018 1545    ProBNP    Component Value Date/Time   PROBNP 282 08/17/2018 1545  PROBNP 145.8 08/19/2011 1831     Lipid Panel     Component Value Date/Time   CHOL 134 07/09/2018 1403   TRIG 47 07/09/2018 1403   HDL 49 07/09/2018 1403   CHOLHDL 2.7 07/09/2018 1403   CHOLHDL 2.7 08/07/2017 0830   VLDL 13 02/06/2017 0917   LDLCALC 76 07/09/2018 1403   LDLCALC 70 08/07/2017 0830     RADIOLOGY: No results found.   Additional studies/ records that were reviewed today include:  I reviewed the patient's prior echo Doppler studies, remote office visit, and most recent office visit with John Hines John Hines  ------------------------------------------------------------------- 01/29/2018 ECHO Study  Conclusions  - Left ventricle: The cavity size was mildly dilated. Wall   thickness was increased in a pattern of mild LVH. Systolic   function was severely reduced. The estimated ejection fraction   was in the range of 20% to 25%. Doppler parameters are consistent   with abnormal left ventricular relaxation (grade 1 diastolic   dysfunction). - Aortic valve: There was trivial regurgitation. - Mitral valve: There was mild regurgitation.  Impressions:  - Compared to echo report from 2015 LVEF is worse.  ------------------------------------------------------------------- 08/31/2018 ECHO Study Conclusions  - Left ventricle: The cavity size was normal. There was severe   focal basal hypertrophy of the septum. Systolic function was   moderately to severely reduced. The estimated ejection fraction   was in the range of 30% to 35%. Diffuse hypokinesis. Doppler   parameters are consistent with abnormal left ventricular   relaxation (grade 1 diastolic dysfunction). - Aortic valve: Trileaflet; mildly thickened, mildly calcified   leaflets. There was trivial regurgitation. - Left atrium: The atrium was mildly dilated.  Impressions:  - EF is mildly improved when compared to prior. (25%)   ASSESSMENT:    1. Abnormal EKG   2. Chronic combined systolic and diastolic heart failure ([place])   3. Essential hypertension   4. Left bundle branch block   5. Solitary functioning left kidney with a nonfunctioning right kidney kidney   6. Hyperlipidemia LDL goal <70     PLAN:  John Hines is an 85 year old gentleman who has a long-standing history of hypertension, and solitary functioning left kidney with a nonfunctioning right kidney.  He has a history of hyperlipidemia, arthritis, BPH and gout.  He is hard-working and typically works  7 days per week.  He admitted to mild shortness of breath with significant activity and an echo Doppler study performed in May 2019 showed a reduction of  LV function to 20 to 25% with grade 1 diastolic dysfunction which was new from his 2015 echo.  Over the past several months he was started on Entresto and has gradually been titrated up to maximum dose at 97/103 mg twice a day.  He had been on carvedilol 12.5 mg twice a day but apparently was told subsequently to reduce to daily.  I last saw him he had run out of his carvedilol for several days and his BMP was further increased.  I resume carvedilol at a 6.25 twice daily regimen and also instituted spironolactone.  Lab work 4 weeks later showed dramatic improvement with a proBNP now normal at 282 and a BNP excellent at 67.  Blood pressure today is stable.  He has class I failure symptoms symptoms and only experiences mild shortness of breath with significant exertion.  His most recent echo Doppler study has shown improvement of LV function to 30 to 35%, but LV function remains impaired.  He also has left bundle branch block with QRS duration at 148 ms.  I have suggested that he see our electrophysiology team.  I discussed with him potential prophylactic ICD implantation light of his residual LV dysfunction but will defer to the EP team their ultimate recommendations and assessment.  I thoroughly reviewed his most recent echo Doppler study and laboratory.  I will see him in 4 to 6 months for reevaluation or sooner if problems arise.   Medication Adjustments/Labs and Tests Ordered: Current medicines are reviewed at length with the patient today.  Concerns regarding medicines are outlined above.  Medication changes, Labs and Tests ordered today are listed in the Patient Instructions below. Patient Instructions  Medication Instructions:  The current medical regimen is effective;  continue present plan and medications.  If you need a refill on your cardiac medications before your next appointment, please call your pharmacy.   Follow-Up: At [facility], you and your health needs are our priority.  As part  of our continuing mission to provide you with exceptional heart care, we have created designated Provider Care Teams.  These Care Teams include your primary Cardiologist (physician) and Advanced Practice Providers (APPs -  Physician Assistants and Nurse Practitioners) who all work together to provide you with the care you need, when you need it. You will need a follow up appointment in 4-6 months.  Please call our office 2 months in advance to schedule this appointment.  You may see Shelva Majestic, MD or one of the following Advanced Practice Providers on your designated Care Team: [place], [place] . Fabian Sharp, PA-C  Any Other Special Instructions Will Be Listed Below (If Applicable). Referral to EP       Signed, Shelva Majestic, MD  09/28/2018 6:21 PM    [facility] [address], [place], [place], [place]  [address] Phone: [phone]

## 2018-10-01 ENCOUNTER — Ambulatory Visit (INDEPENDENT_AMBULATORY_CARE_PROVIDER_SITE_OTHER): Payer: Medicare Other | Admitting: Family Medicine

## 2018-10-01 ENCOUNTER — Encounter: Payer: Self-pay | Admitting: Family Medicine

## 2018-10-01 DIAGNOSIS — N183 Chronic kidney disease, stage 3 unspecified: Secondary | ICD-10-CM

## 2018-10-01 DIAGNOSIS — J069 Acute upper respiratory infection, unspecified: Secondary | ICD-10-CM | POA: Diagnosis not present

## 2018-10-01 DIAGNOSIS — N181 Chronic kidney disease, stage 1: Secondary | ICD-10-CM

## 2018-10-01 DIAGNOSIS — I509 Heart failure, unspecified: Secondary | ICD-10-CM

## 2018-10-01 DIAGNOSIS — Z7189 Other specified counseling: Secondary | ICD-10-CM

## 2018-10-01 MED ORDER — BENZONATATE 200 MG PO CAPS: 200.0000 mg | ORAL_CAPSULE | Freq: Three times a day (TID) | ORAL | 1 refills | Status: DC | PRN

## 2018-10-01 NOTE — Patient Instructions (Signed)
Use robitussin as needed for the cough.   If you keep having trouble from the cough, then use tessalon.    Don't take ibuprofen or aleve.   Update me as needed.

## 2018-10-01 NOTE — Progress Notes (Signed)
New patient.  Establishing care at this clinic but previously seen at other [facility] clinics in the past.  I had met him before.  I had the privilege of caring for his wife for several years before she passed away.  She had died relatively recently and he is still working through all of the changes associated with that.  He was widowed 2 days after his 57-year anniversary.  He has family support.  Cough and rhinorrhea.  Cough better with robitussin.  Sx started about 2 days ago.  No fevers or sweats at home.  He thought he had developed a cold.  CHF history noted.    Using medication without problems or lightheadedness: yes Chest pain with exertion:no Edema:no Short of breath: only with exertion, this is stable.   We talked about CHF pathophysiology with maintaining his dry weight and avoiding offending agents such as NSAIDs, salt, etc.  We talked about the rationale for his current medications.  CKD.  Per Dr. Dunham with nephrology.  Discussed with patient about NSAID cautions.  Advance directive discussed with patient.  His daughter Leta Austin is designated if the patient were incapacitated.  Meds, vitals, and allergies reviewed.   PMH and SH reviewed  ROS: Per HPI unless specifically indicated in ROS section   GEN: nad, alert and oriented HEENT: mucous membranes moist, tm w/o erythema, nasal exam w/o erythema, clear discharge noted,  OP with cobblestoning NECK: supple w/o LA CV: rrr.   PULM: ctab, no inc wob EXT: no edema SKIN: no acute rash  

## 2018-10-05 ENCOUNTER — Encounter: Payer: Self-pay | Admitting: Family Medicine

## 2018-10-05 DIAGNOSIS — Z7189 Other specified counseling: Secondary | ICD-10-CM | POA: Insufficient documentation

## 2018-10-05 DIAGNOSIS — J069 Acute upper respiratory infection, unspecified: Secondary | ICD-10-CM | POA: Insufficient documentation

## 2018-10-05 NOTE — Assessment & Plan Note (Deleted)
Avoid NSAIDs.  Rationale for treatment discussed with patient.  He is seen nephrology.

## 2018-10-05 NOTE — Assessment & Plan Note (Signed)
Avoid NSAIDs.  Rationale for treatment discussed with patient.  He is seen nephrology.

## 2018-10-05 NOTE — Assessment & Plan Note (Signed)
Advance directive discussed with patient.  His daughter Joylene Grapes is designated if the patient were incapacitated.

## 2018-10-05 NOTE — Assessment & Plan Note (Addendum)
He has had follow-up with cardiology.  No change in meds. We talked about CHF pathophysiology with maintaining his dry weight and avoiding offending agents such as NSAIDs, salt, etc.  We talked about the rationale for his current medications.  >25 minutes spent in face to face time with patient, >50% spent in counselling or coordination of care.

## 2018-10-05 NOTE — Assessment & Plan Note (Signed)
Likely viral.  Nontoxic.  Okay for outpatient follow-up.  Discussed options. Use robitussin as needed for the cough.  If still having trouble from the cough, then use tessalon.   Update me as needed.  He agrees. He can get a flu shot when feeling better.

## 2018-10-16 ENCOUNTER — Encounter: Payer: Self-pay | Admitting: Cardiology

## 2018-10-16 ENCOUNTER — Ambulatory Visit: Payer: Medicare Other | Admitting: Cardiology

## 2018-10-16 VITALS — BP 136/66 | HR 69 | Ht 67.5 in | Wt 173.0 lb

## 2018-10-16 DIAGNOSIS — I5022 Chronic systolic (congestive) heart failure: Secondary | ICD-10-CM | POA: Diagnosis not present

## 2018-10-16 NOTE — Patient Instructions (Signed)
Medication Instructions:  Your physician recommends that you continue on your current medications as directed. Please refer to the Current Medication list given to you today.  * If you need a refill on your cardiac medications before your next appointment, please call your pharmacy.   Labwork: None ordered  Testing/Procedures: None ordered  Follow-Up: No follow up is needed at this time with Dr. Camnitz.  He will see you on an as needed basis.   Thank you for choosing [facility]!!   Kyeshia Zinn, RN [phone]     

## 2018-10-16 NOTE — Progress Notes (Signed)
Electrophysiology Office Note   Date:  10/16/2018   ID:  Ala, Capri [birth year], MRN [id number]  PCP:  Tonia Ghent, MD  Cardiologist:  Claiborne Billings Primary Electrophysiologist:  Clara Smolen Meredith Leeds, MD    No chief complaint on file.    History of Present Illness: ORON WESTRUP is a 85 y.o. male who is being seen today for the evaluation of CHF at the request of Troy Sine, MD. Presenting today for electrophysiology evaluation.  He has a history of remote right cerebellar infarction, hyperlipidemia, hypertension.  He had a recent echocardiogram that showed an EF of 30 to 35%.    Today, he denies symptoms of palpitations, chest pain, shortness of breath, orthopnea, PND, lower extremity edema, claudication, dizziness, presyncope, syncope, bleeding, or neurologic sequela. The patient is tolerating medications without difficulties.  He is currently feeling well.  Has no chest pain or shortness of breath.  He tells me he is able to do all his daily activities without restriction.  He continues to work 7 days a week.  He says that he could run 3 miles if he needed to, but he has not done this in many years due to orthopedic complaints.   Past Medical History:  Diagnosis Date  . Arthritis   . BPH (benign prostatic hyperplasia)   . CHF (congestive heart failure) ([place])    Nonischemic Cardiomyopathy. EF 25-35%  . Chronic kidney disease 05/20/2013   CKD 3  . Colon polyps   . Dyslipidemia   . Dysrhythmia 08/19/11   "low heart beat; maybe 25bpm; got RX; now 60bpm"  . ED (erectile dysfunction)   . Gout   . Heart murmur   . Hyperlipidemia   . Hypertension   . Low back pain 08/12/2013  . Nonfunctioning kidney Recognized 03/2006   Chronically and Severely Hydronephrotic and Nonfunctioning Right Kidney  . Solitary kidney Recognized 03/2006   Solitary Functioning Left Kidney  . Stroke [facility])    "mild stroke; dx'd 2010; don't know when it happened"  . Syncope and collapse  08/19/11   "shallow breathing; sweating horribly"   Past Surgical History:  Procedure Laterality Date  . CARDIOVASCULAR STRESS TEST  08/30/2010   Small to moderate fixed inferoseptal defect. Severe global hypokinesis with inferior akinesis and septal dyskinesis-likely secondary to a prominent LBBB. Non-diagnostic for ischemia due to persantine.  Marland Kitchen KNEE ARTHROSCOPY  1996   right  . TRANSTHORACIC ECHOCARDIOGRAM  08/20/2011   EF 45-50%, severe concentric hyperthrophy.     Current Outpatient Medications  Medication Sig Dispense Refill  . amLODipine (NORVASC) 5 MG tablet Take 1 tablet (5 mg total) by mouth daily. 180 tablet 1  . aspirin EC 81 MG tablet Take 81 mg by mouth daily.      Marland Kitchen atorvastatin (LIPITOR) 80 MG tablet TAKE 1 TABLET BY MOUTH EVERY DAY 90 tablet 0  . benzonatate (TESSALON) 200 MG capsule Take 1 capsule (200 mg total) by mouth 3 (three) times daily as needed. 30 capsule 1  . carvedilol (COREG) 6.25 MG tablet Take 1 tablet (6.25 mg total) by mouth 2 (two) times daily with a meal. 180 tablet 3  . ferrous sulfate 325 (65 FE) MG tablet TAKE 1 TABLET (325 MG TOTAL) BY MOUTH DAILY. 90 tablet 2  . Omega-3 Fatty Acids (FISH OIL) 1000 MG CAPS Take 1,000 mg by mouth daily.    . sacubitril-valsartan (ENTRESTO) 97-103 MG Take 1 tablet by mouth 2 (two) times daily. 60 tablet 5  .  sildenafil (VIAGRA) 100 MG tablet Take 1 tablet (100 mg total) by mouth daily as needed for erectile dysfunction. 6 tablet 11  . spironolactone (ALDACTONE) 25 MG tablet Take 0.5 tablets (12.5 mg total) by mouth daily. 45 tablet 3   No current facility-administered medications for this visit.     Allergies:   Lisinopril; Nsaids; and Penicillins   Social History:  The patient  reports that he has never smoked. He has never used smokeless tobacco. He reports current alcohol use. He reports that he does not use drugs.   Family History:  The patient's family history includes Early death in his mother; Stomach  cancer in his father.    ROS:  Please see the history of present illness.   Otherwise, review of systems is positive for none.   All other systems are reviewed and negative.    PHYSICAL EXAM: VS:  BP 136/66   Pulse 69   Ht 5' 7.5" (1.715 m)   Wt 173 lb (78.5 kg)   SpO2 99%   BMI 26.70 kg/m  , BMI Body mass index is 26.7 kg/m. GEN: Well nourished, well developed, in no acute distress  HEENT: normal  Neck: no JVD, carotid bruits, or masses Cardiac: RRR; no murmurs, rubs, or gallops,no edema  Respiratory:  clear to auscultation bilaterally, normal work of breathing GI: soft, nontender, nondistended, + BS MS: no deformity or atrophy  Skin: warm and dry Neuro:  Strength and sensation are intact Psych: euthymic mood, full affect  EKG:  EKG is not ordered today. Personal review of the ekg ordered 09/28/17 shows sinus rhythm, left bundle branch block  Recent Labs: 06/17/2018: Hemoglobin 12.6; Platelets 161 07/09/2018: ALT 12; BUN 13; Creatinine, Ser 1.35; Potassium 4.2; Sodium 147 08/17/2018: BNP 67.7; NT-Pro BNP 282    Lipid Panel     Component Value Date/Time   CHOL 134 07/09/2018 1403   TRIG 47 07/09/2018 1403   HDL 49 07/09/2018 1403   CHOLHDL 2.7 07/09/2018 1403   CHOLHDL 2.7 08/07/2017 0830   VLDL 13 02/06/2017 0917   LDLCALC 76 07/09/2018 1403   LDLCALC 70 08/07/2017 0830     Wt Readings from Last 3 Encounters:  10/16/18 173 lb (78.5 kg)  10/01/18 173 lb (78.5 kg)  09/28/18 174 lb 3.2 oz (79 kg)      Other studies Reviewed: Additional studies/ records that were reviewed today include: TTE 08/31/18  Review of the above records today demonstrates:  - Left ventricle: The cavity size was normal. There was severe   focal basal hypertrophy of the septum. Systolic function was   moderately to severely reduced. The estimated ejection fraction   was in the range of 30% to 35%. Diffuse hypokinesis. Doppler   parameters are consistent with abnormal left ventricular    relaxation (grade 1 diastolic dysfunction). - Aortic valve: Trileaflet; mildly thickened, mildly calcified   leaflets. There was trivial regurgitation. - Left atrium: The atrium was mildly dilated.   ASSESSMENT AND PLAN:  1.  Chronic systolic and diastolic heart failure: Currently on optimal medical therapy with Entresto and Coreg.  He is currently feeling well without much in the way of symptoms.  I did discuss ICD therapy with him, though I do this point I do not feel like it is indicated.  He is minimally symptomatic from his heart failure and at age 59, I do not see him having much of a benefit.  We Lenoria Narine thus continue his current medications.  I Bronsen Serano discuss  this with his primary cardiologist.  2.  Hypertension: Mildly elevated today but has been more normal in the past.  We Macall Mccroskey continue with current management.  3.  Hyperlipidemia: Continue Lipitor  Current medicines are reviewed at length with the patient today.   The patient does not have concerns regarding his medicines.  The following changes were made today:  none  Labs/ tests ordered today include:  No orders of the defined types were placed in this encounter.    Disposition:   FU with Odell Choung as needed  Signed, Akhil Piscopo Meredith Leeds, MD  10/16/2018 9:29 AM     [place] [address] [phone] (office) [phone] (fax)

## 2018-11-27 ENCOUNTER — Other Ambulatory Visit: Payer: Self-pay | Admitting: Family Medicine

## 2018-12-20 ENCOUNTER — Other Ambulatory Visit: Payer: Self-pay | Admitting: Cardiovascular Disease

## 2018-12-21 NOTE — Telephone Encounter (Deleted)
Refill RX

## 2019-01-08 ENCOUNTER — Telehealth: Payer: Self-pay

## 2019-01-08 DIAGNOSIS — N529 Male erectile dysfunction, unspecified: Secondary | ICD-10-CM

## 2019-01-08 MED ORDER — ATORVASTATIN CALCIUM 80 MG PO TABS: 80.0000 mg | ORAL_TABLET | Freq: Every day | ORAL | 0 refills | Status: DC

## 2019-01-08 MED ORDER — SPIRONOLACTONE 25 MG PO TABS: 12.5000 mg | ORAL_TABLET | Freq: Every day | ORAL | 3 refills | Status: DC

## 2019-01-08 MED ORDER — CARVEDILOL 6.25 MG PO TABS: 6.2500 mg | ORAL_TABLET | Freq: Two times a day (BID) | ORAL | 3 refills | Status: DC

## 2019-01-08 NOTE — Telephone Encounter (Signed)
John Hines,pts daughter (DPR signed) said that pt is going to start getting meds thru [facility] and pt request refills on viagra (wants 90 day supply), benzonatate(to use as needed when gets a cough), and ferrous sulfate. Pt established care on 10/01/18. In that office visit due to CHF and chronic kidney disease will send request to Dr Damita Dunnings. Do not see anything mentioned in that office note about the ferrous sulfate. Please advise.

## 2019-01-08 NOTE — Telephone Encounter (Signed)
F/U Message           Patient is now wanting to do a 90 day supply with [facility].     *STAT* If patient is at the pharmacy, call can be transferred to refill team.   1. Which medications need to be refilled? (please list name of each medication and dose if known) Lipitor/Carvedilol/Cholecalciferol/Feroous/Sacubitril/Acubitril/  2. Which pharmacy/location (including street and city if local pharmacy) is medication to be sent to? [facility]  3. Do they need a 30 day or 90 day supply? [place]

## 2019-01-08 NOTE — Telephone Encounter (Signed)
Called patient, spoke with daughter. She was advised I can do Cardiac medications, other meds come from PCP. She verbalized understanding. Medications sent into pharmacy

## 2019-01-10 MED ORDER — SILDENAFIL CITRATE 100 MG PO TABS: 100.0000 mg | ORAL_TABLET | Freq: Every day | ORAL | 3 refills | Status: DC | PRN

## 2019-01-10 MED ORDER — BENZONATATE 200 MG PO CAPS: 200.0000 mg | ORAL_CAPSULE | Freq: Three times a day (TID) | ORAL | 3 refills | Status: DC | PRN

## 2019-01-10 NOTE — Telephone Encounter (Signed)
Sent tessalon and slidenafil.  I need to know about the iron rx.  I didn't see that prev.  When was his last set of labs done (meaning at renal clinic)?  Are they following his iron level? Is renal taking care of that?  Let me know if I still need to send the iron. And I need the renal notes from Dr. Lorrene Reid.  Thanks.

## 2019-01-12 NOTE — Telephone Encounter (Signed)
Tried to reach patient regarding iron Rx. NA and unable to leave message

## 2019-01-29 ENCOUNTER — Telehealth: Payer: Self-pay

## 2019-01-29 NOTE — Telephone Encounter (Signed)
R knee pain.  H/o leg injury years ago.  Had knee surgery years ago.  Had been using opiates occ to keep working, so the pain was controlled enough to work.  Not used daily.  No ADE on med.  No locking or clicking but "then then pain will hit me and I'll have to limp."  He had prev knee injection years ago with relief.   D/w pt about defering eval for now (and I agree with his preference) given the pandemic.  It may be reasonable to Dr. Lorelei Pont at some point.    D/w pt about avoid nsaids.  Would be okay to take tylenol.  500mg  tylenol, 2 at a time, up to TID PRN may be useful.  He could try tramadol if needed.  I didn't send rx yet.  He'll update me as needed.   I would hold on hydrocodone for now.  He agrees.

## 2019-01-29 NOTE — Telephone Encounter (Signed)
Leta (DPR signed) left v/m requesting cb to pt about a leg issue. I spoke with pt and pt having pain in knee. Pt takes 1/2 pain pill so pt can work. Pt had injury to rt knee 2 years ago after fall at [facility] and pt is using 1/2 pain pill daily and that takes care of knee pain. Pt requesting refill of hydrocodone apap 5-325 mg last refilled # 60 on 03/02/14 by Dena Billet PA. Pt has been taking hydrocodone apap 5-325 of his deceased wife.pt is out of med and willing to do virtual visit today with Dr Damita Dunnings but no available appt with Dr Damita Dunnings today. Please advise. [facility] please call pt after Dr Damita Dunnings reviews.

## 2019-02-16 ENCOUNTER — Encounter: Payer: Self-pay | Admitting: Family Medicine

## 2019-02-16 ENCOUNTER — Other Ambulatory Visit: Payer: Self-pay

## 2019-02-16 ENCOUNTER — Ambulatory Visit (INDEPENDENT_AMBULATORY_CARE_PROVIDER_SITE_OTHER): Payer: Medicare Other | Admitting: Family Medicine

## 2019-02-16 DIAGNOSIS — M545 Low back pain, unspecified: Secondary | ICD-10-CM

## 2019-02-16 MED ORDER — TRAMADOL HCL 50 MG PO TABS: 50.0000 mg | ORAL_TABLET | Freq: Two times a day (BID) | ORAL | 0 refills | Status: DC | PRN

## 2019-02-16 NOTE — Patient Instructions (Signed)
Use tylenol as needed for pain.  Use ice 5 minutes at a time.  Then use tramadol up to twice a day if needed for pain.  Take care.  Glad to see you.  Update me as needed.

## 2019-02-16 NOTE — Progress Notes (Signed)
His knee pain is better in the meantime.    L "hip pain".  Started about 10 days ago.  Mild initially, worse in the meantime.  Taking tylenol for pain.  Pain waxes and wanes, some better with tylenol but not resolved.  Less pain standing, less pain sitting, but he has pain with walking and with getting out of a chair.  The pain is actually near his left SI joint.  He fell about 6 weeks ago but did not start having pain until about 10 days ago.  He had slipped when he was at work but that seemed to be an unrelated event.  Fall cautions discussed with patient.  He has a prescription for Viagra but he is not using any long-acting or short-acting nitrates.  Routine cautions discussed with patient.  Meds, vitals, and allergies reviewed.   ROS: Per HPI unless specifically indicated in ROS section   nad ncat rrr ctab abd soft not ttp Extremities without edema. Normal strength and sensation in the bilateral lower extremities. Back not tender in the midline. Left SI joint is not tender to palpation but he has pain in the area with SI joint testing. Normal hip range of motion.  No pain on internal or external rotation of the hip.  No pain on internal rotation of the hip.  Greater trochanteric area is not tender to palpation. No rash. Able to bear weight.

## 2019-02-17 NOTE — Assessment & Plan Note (Addendum)
This looks like SI joint pain.  Discussed with patient about options.  Would use ice instead of heat.  Continue Tylenol.  Can use tramadol if needed with routine cautions.  Imaging him at this point would likely not change the plan.  We did not refer him to physical therapy because the pandemic.  All discussed with patient and he understood and agreed.  He will update me as needed. Fall cautions discussed with patient It does not look like he has any obvious hip pathology at this point. >25 minutes spent in face to face time with patient, >50% spent in counselling or coordination of care.

## 2019-02-23 ENCOUNTER — Telehealth: Payer: Self-pay

## 2019-02-23 NOTE — Telephone Encounter (Signed)
Pt's daughter returned VM from [place]. Office closed. Please advise.

## 2019-02-23 NOTE — Telephone Encounter (Signed)
I would expect him to gradually get some better but may not be back to baseline yet.   Is he any better?  Did the med help?  Did he tolerate it? If he needs a refill then I can send that in.  Please let me know about the above.   Thanks.

## 2019-02-23 NOTE — Telephone Encounter (Signed)
Left VM for daughter to call back. 

## 2019-02-23 NOTE — Telephone Encounter (Signed)
Pt's daughter called and left a message on Triage line. Asked for a call back.  He only has 1 kidney.  Pt has 1 tramadol left but says he only takes it every 12 hours. Asking what the timeframe is for recovery. The bottle says he got #14 per his bottle.  Please call daughter back.

## 2019-02-24 ENCOUNTER — Other Ambulatory Visit: Payer: Self-pay | Admitting: Family Medicine

## 2019-02-24 NOTE — Telephone Encounter (Signed)
Patient's daughter Marijean Heath called back today and was advised of message below.  She was with the patient at the time of call and stated that the patient has been tolerating the medication well He still feels Dizzy sometimes but feels like it will take time to heal. The Only time he feels sick is when he takes the medication without food. Patient had a rough day yesterday but today he is feeling better and stated that he does feel like the medication helps.  Patient [facility].

## 2019-02-24 NOTE — Telephone Encounter (Signed)
I have tried to send this in electronically.  For some reason the system will not allow that currently.  Please call this in.  If it cannot be called in then please let me know.  Thanks.

## 2019-02-24 NOTE — Telephone Encounter (Signed)
Left VM for daughter to return call.

## 2019-02-25 MED ORDER — TRAMADOL HCL 50 MG PO TABS: 50.0000 mg | ORAL_TABLET | Freq: Two times a day (BID) | ORAL | 0 refills | Status: DC | PRN

## 2019-02-25 NOTE — Telephone Encounter (Signed)
Rx was not faxed until after 12.  Rx should be at the pharmacy now.  Daughter advised.

## 2019-02-25 NOTE — Telephone Encounter (Signed)
Signed. Thanks.

## 2019-02-25 NOTE — Telephone Encounter (Signed)
Rx printed and faxed.  

## 2019-02-25 NOTE — Telephone Encounter (Signed)
John Hines (daughter) called stating [facility] didn't have paperwork as of 12today.    Best number [phone] pt number  [facility]

## 2019-02-25 NOTE — Telephone Encounter (Signed)
Faxed

## 2019-02-25 NOTE — Telephone Encounter (Signed)
Please refax paperwork

## 2019-02-25 NOTE — Telephone Encounter (Signed)
The system will not allow me to send this in electronically but I did print it for your signature and I will [facility] [place].

## 2019-03-16 ENCOUNTER — Other Ambulatory Visit: Payer: Self-pay | Admitting: Cardiovascular Disease

## 2019-03-24 DIAGNOSIS — I129 Hypertensive chronic kidney disease with stage 1 through stage 4 chronic kidney disease, or unspecified chronic kidney disease: Secondary | ICD-10-CM | POA: Diagnosis not present

## 2019-03-24 DIAGNOSIS — E785 Hyperlipidemia, unspecified: Secondary | ICD-10-CM | POA: Diagnosis not present

## 2019-03-24 DIAGNOSIS — N183 Chronic kidney disease, stage 3 (moderate): Secondary | ICD-10-CM | POA: Diagnosis not present

## 2019-04-09 ENCOUNTER — Encounter: Payer: Self-pay | Admitting: Family Medicine

## 2019-04-09 LAB — LAB REPORT - SCANNED
Creatinine, Ser: 1.6
Glucose: 169
Potassium: 4.2

## 2019-05-01 ENCOUNTER — Other Ambulatory Visit: Payer: Self-pay | Admitting: Cardiovascular Disease

## 2019-05-09 ENCOUNTER — Emergency Department (HOSPITAL_COMMUNITY)
Admission: EM | Admit: 2019-05-09 | Discharge: 2019-05-09 | Disposition: A | Discharge status: Home / Self Care | Payer: Medicare Other | Attending: Emergency Medicine | Admitting: Emergency Medicine

## 2019-05-09 ENCOUNTER — Other Ambulatory Visit: Payer: Self-pay

## 2019-05-09 ENCOUNTER — Emergency Department (HOSPITAL_COMMUNITY): Payer: Medicare Other

## 2019-05-09 DIAGNOSIS — N183 Chronic kidney disease, stage 3 unspecified: Secondary | ICD-10-CM

## 2019-05-09 DIAGNOSIS — I951 Orthostatic hypotension: Secondary | ICD-10-CM | POA: Insufficient documentation

## 2019-05-09 DIAGNOSIS — Z79899 Other long term (current) drug therapy: Secondary | ICD-10-CM | POA: Insufficient documentation

## 2019-05-09 DIAGNOSIS — Y92009 Unspecified place in unspecified non-institutional (private) residence as the place of occurrence of the external cause: Secondary | ICD-10-CM | POA: Diagnosis not present

## 2019-05-09 DIAGNOSIS — I499 Cardiac arrhythmia, unspecified: Secondary | ICD-10-CM | POA: Diagnosis not present

## 2019-05-09 DIAGNOSIS — Z03818 Encounter for observation for suspected exposure to other biological agents ruled out: Secondary | ICD-10-CM | POA: Diagnosis not present

## 2019-05-09 DIAGNOSIS — Y999 Unspecified external cause status: Secondary | ICD-10-CM | POA: Insufficient documentation

## 2019-05-09 DIAGNOSIS — E785 Hyperlipidemia, unspecified: Secondary | ICD-10-CM | POA: Diagnosis not present

## 2019-05-09 DIAGNOSIS — Z7982 Long term (current) use of aspirin: Secondary | ICD-10-CM | POA: Insufficient documentation

## 2019-05-09 DIAGNOSIS — Z20828 Contact with and (suspected) exposure to other viral communicable diseases: Secondary | ICD-10-CM | POA: Insufficient documentation

## 2019-05-09 DIAGNOSIS — I129 Hypertensive chronic kidney disease with stage 1 through stage 4 chronic kidney disease, or unspecified chronic kidney disease: Secondary | ICD-10-CM | POA: Diagnosis not present

## 2019-05-09 DIAGNOSIS — I130 Hypertensive heart and chronic kidney disease with heart failure and stage 1 through stage 4 chronic kidney disease, or unspecified chronic kidney disease: Secondary | ICD-10-CM | POA: Insufficient documentation

## 2019-05-09 DIAGNOSIS — S0181XA Laceration without foreign body of other part of head, initial encounter: Secondary | ICD-10-CM | POA: Insufficient documentation

## 2019-05-09 DIAGNOSIS — Y9389 Activity, other specified: Secondary | ICD-10-CM | POA: Insufficient documentation

## 2019-05-09 DIAGNOSIS — Z880 Allergy status to penicillin: Secondary | ICD-10-CM | POA: Insufficient documentation

## 2019-05-09 DIAGNOSIS — W1839XA Other fall on same level, initial encounter: Secondary | ICD-10-CM | POA: Diagnosis not present

## 2019-05-09 DIAGNOSIS — R55 Syncope and collapse: Secondary | ICD-10-CM | POA: Diagnosis not present

## 2019-05-09 DIAGNOSIS — I5022 Chronic systolic (congestive) heart failure: Secondary | ICD-10-CM | POA: Diagnosis not present

## 2019-05-09 DIAGNOSIS — I447 Left bundle-branch block, unspecified: Secondary | ICD-10-CM | POA: Diagnosis not present

## 2019-05-09 DIAGNOSIS — R000 Tachycardia, unspecified: Secondary | ICD-10-CM | POA: Diagnosis not present

## 2019-05-09 DIAGNOSIS — R42 Dizziness and giddiness: Secondary | ICD-10-CM | POA: Diagnosis not present

## 2019-05-09 LAB — CBC WITH DIFFERENTIAL/PLATELET
Abs Immature Granulocytes: 0.03 10*3/uL (ref 0.00–0.07)
Basophils Absolute: 0 10*3/uL (ref 0.0–0.1)
Basophils Relative: 0 %
Eosinophils Absolute: 0.1 10*3/uL (ref 0.0–0.5)
Eosinophils Relative: 2 %
HCT: 40.7 % (ref 39.0–52.0)
Hemoglobin: 13.3 g/dL (ref 13.0–17.0)
Immature Granulocytes: 1 %
Lymphocytes Relative: 30 %
Lymphs Abs: 1.3 10*3/uL (ref 0.7–4.0)
MCH: 28.2 pg (ref 26.0–34.0)
MCHC: 32.7 g/dL (ref 30.0–36.0)
MCV: 86.4 fL (ref 80.0–100.0)
Monocytes Absolute: 0.5 10*3/uL (ref 0.1–1.0)
Monocytes Relative: 10 %
Neutro Abs: 2.4 10*3/uL (ref 1.7–7.7)
Neutrophils Relative %: 57 %
Platelets: 200 10*3/uL (ref 150–400)
RBC: 4.71 MIL/uL (ref 4.22–5.81)
RDW: 13.2 % (ref 11.5–15.5)
WBC: 4.3 10*3/uL (ref 4.0–10.5)
nRBC: 0 % (ref 0.0–0.2)

## 2019-05-09 LAB — URINALYSIS, ROUTINE W REFLEX MICROSCOPIC
Bilirubin Urine: NEGATIVE
Glucose, UA: NEGATIVE mg/dL
Hgb urine dipstick: NEGATIVE
Ketones, ur: NEGATIVE mg/dL
Leukocytes,Ua: NEGATIVE
Nitrite: NEGATIVE
Protein, ur: 30 mg/dL — AB
Specific Gravity, Urine: 1.016 (ref 1.005–1.030)
pH: 6 (ref 5.0–8.0)

## 2019-05-09 LAB — COMPREHENSIVE METABOLIC PANEL
ALT: 14 U/L (ref 0–44)
AST: 16 U/L (ref 15–41)
Albumin: 3.4 g/dL — ABNORMAL LOW (ref 3.5–5.0)
Alkaline Phosphatase: 101 U/L (ref 38–126)
Anion gap: 11 (ref 5–15)
BUN: 16 mg/dL (ref 8–23)
CO2: 22 mmol/L (ref 22–32)
Calcium: 9.1 mg/dL (ref 8.9–10.3)
Chloride: 106 mmol/L (ref 98–111)
Creatinine, Ser: 1.58 mg/dL — ABNORMAL HIGH (ref 0.61–1.24)
GFR calc Af Amer: 46 mL/min — ABNORMAL LOW (ref >60)
GFR calc non Af Amer: 39 mL/min — ABNORMAL LOW (ref >60)
Glucose, Bld: 233 mg/dL — ABNORMAL HIGH (ref 70–99)
Potassium: 3.5 mmol/L (ref 3.5–5.1)
Sodium: 139 mmol/L (ref 135–145)
Total Bilirubin: 0.9 mg/dL (ref 0.3–1.2)
Total Protein: 6.3 g/dL — ABNORMAL LOW (ref 6.5–8.1)

## 2019-05-09 LAB — TROPONIN I (HIGH SENSITIVITY)
Troponin I (High Sensitivity): 13 ng/L (ref <18)
Troponin I (High Sensitivity): 20 ng/L — ABNORMAL HIGH (ref <18)

## 2019-05-09 LAB — PROTIME-INR
INR: 1.1 (ref 0.8–1.2)
Prothrombin Time: 14 seconds (ref 11.4–15.2)

## 2019-05-09 LAB — SARS CORONAVIRUS 2 (TAT 6-24 HRS): SARS Coronavirus 2: NEGATIVE

## 2019-05-09 LAB — CBG MONITORING, ED: Glucose-Capillary: 238 mg/dL — ABNORMAL HIGH (ref 70–99)

## 2019-05-09 MED ORDER — SODIUM CHLORIDE 0.9 % IV BOLUS: 1000.0000 mL | Freq: Once | INTRAVENOUS | Status: DC

## 2019-05-09 MED ORDER — SODIUM CHLORIDE 0.9 % IV SOLN
INTRAVENOUS | Status: DC
  Administered 2019-05-09: 12:00:00 via INTRAVENOUS

## 2019-05-09 MED ORDER — SODIUM CHLORIDE 0.9 % IV BOLUS
500.0000 mL | Freq: Once | INTRAVENOUS | Status: AC
  Administered 2019-05-09: 500 mL via INTRAVENOUS

## 2019-05-09 NOTE — ED Triage Notes (Signed)
Patient experienced dizziness, weakness, "sticky sweat" after eating breakfast this morning. States he took two steps and fell, didn't loose consciousness. History of previous near syncopal episode.

## 2019-05-09 NOTE — ED Provider Notes (Signed)
[place] EMERGENCY DEPARTMENT Provider Note   CSN: [id number] Arrival date & time: 05/09/19  1058    History   Chief Complaint Chief Complaint  Patient presents with  . Near Syncope    HPI John Hines is a 85 y.o. male.     Pt presents to the ED today with near syncope.  Pt said he finished eating his oatmeal this morning and became sweaty.  He denies having cp or sob.  He tried to get up to lie down when he fell the the ground.  He denies loc.  When EMS arrived, he was orthostatic (sbp in the 70s with standing).  They started IVFs.  Pt said he is feeling better now.     Past Medical History:  Diagnosis Date  . Arthritis   . BPH (benign prostatic hyperplasia)   . CHF (congestive heart failure) ([place])    Nonischemic Cardiomyopathy. EF 25-35%  . Chronic kidney disease 05/20/2013   CKD 3  . Colon polyps   . Dyslipidemia   . Dysrhythmia 08/19/11   "low heart beat; maybe 25bpm; got RX; now 60bpm"  . ED (erectile dysfunction)   . Gout   . Heart murmur   . Hyperlipidemia   . Hypertension   . Low back pain 08/12/2013  . Nonfunctioning kidney Recognized 03/2006   Chronically and Severely Hydronephrotic and Nonfunctioning Right Kidney  . Solitary kidney Recognized 03/2006   Solitary Functioning Left Kidney  . Stroke [facility])    "mild stroke; dx'd 2010; don't know when it happened"  . Syncope and collapse 08/19/11   "shallow breathing; sweating horribly"    Patient Active Problem List   Diagnosis Date Noted  . Advance care planning 10/05/2018  . URI (upper respiratory infection) 10/05/2018  . Iron deficiency anemia 08/07/2017  . BPH (benign prostatic hyperplasia) 03/21/2016  . Lower back pain 08/12/2013  . Eczema 08/12/2013  . Nonfunctioning kidney   . CHF (congestive heart failure) ([place])   . Chronic systolic heart failure ([place]) 03/11/2013  . Gout 03/11/2013  . Syncope 08/21/2011  . Chronic kidney disease, stage III (moderate) ([place]) 08/21/2011   . Hyperglycemia 08/21/2011  . Left bundle branch block 08/21/2011  . Hyperlipidemia   . Hypertension   . Dyslipidemia     Past Surgical History:  Procedure Laterality Date  . CARDIOVASCULAR STRESS TEST  08/30/2010   Small to moderate fixed inferoseptal defect. Severe global hypokinesis with inferior akinesis and septal dyskinesis-likely secondary to a prominent LBBB. Non-diagnostic for ischemia due to persantine.  Marland Kitchen KNEE ARTHROSCOPY  1996   right  . TRANSTHORACIC ECHOCARDIOGRAM  08/20/2011   EF 45-50%, severe concentric hyperthrophy.        Home Medications    Prior to Admission medications   Medication Sig Start Date End Date Taking? Authorizing Provider  acetaminophen (TYLENOL) 500 MG tablet Take 500-1,000 mg by mouth every 8 (eight) hours as needed.    [provider]  amLODipine (NORVASC) 5 MG tablet TAKE 1 TABLET BY MOUTH EVERY DAY 03/16/19   Troy Sine, MD  aspirin EC 81 MG tablet Take 81 mg by mouth daily.      [provider]  atorvastatin (LIPITOR) 80 MG tablet TAKE 1 TABLET BY MOUTH EVERY DAY 05/04/19   Troy Sine, MD  benzonatate (TESSALON) 200 MG capsule Take 1 capsule (200 mg total) by mouth 3 (three) times daily as needed. 01/10/19   Tonia Ghent, MD  carvedilol (COREG) 6.25 MG  tablet Take 1 tablet (6.25 mg total) by mouth 2 (two) times daily with a meal. 01/08/19   Troy Sine, MD  ENTRESTO 97-103 MG TAKE 1 TABLET BY MOUTH TWICE A DAY 12/21/18   Troy Sine, MD  ferrous sulfate 325 (65 FE) MG tablet TAKE 1 TABLET (325 MG TOTAL) BY MOUTH DAILY. 11/27/18   Tonia Ghent, MD  Omega-3 Fatty Acids (FISH OIL) 1000 MG CAPS Take 1,000 mg by mouth daily.    [provider]  sildenafil (VIAGRA) 100 MG tablet Take 1 tablet (100 mg total) by mouth daily as needed for erectile dysfunction. 01/10/19   Tonia Ghent, MD  spironolactone (ALDACTONE) 25 MG tablet Take 0.5 tablets (12.5 mg total) by mouth daily. 01/08/19 04/08/19  Troy Sine, MD  traMADol (ULTRAM) 50 MG tablet Take 1 tablet (50 mg total) by mouth every 12 (twelve) hours as needed. 02/25/19   Tonia Ghent, MD    Family History Family History  Problem Relation Age of Onset  . Early death Mother        died in child birth  . Stomach cancer Father        stomach    Social History Social History   Tobacco Use  . Smoking status: Never Smoker  . Smokeless tobacco: Never Used  Substance Use Topics  . Alcohol use: Yes  . Drug use: No     Allergies   Lisinopril, Nsaids, and Penicillins   Review of Systems Review of Systems  Neurological: Positive for syncope.  All other systems reviewed and are negative.    Physical Exam Updated Vital Signs BP (!) 116/58   Pulse 66   Temp 98.7 F (37.1 C)   Resp 14   Ht 5\' 7"  (1.702 m)   Wt 78 kg   SpO2 98%   BMI 26.94 kg/m   Physical Exam Vitals signs and nursing note reviewed.  Constitutional:      Appearance: Normal appearance.  HENT:     Head: Normocephalic and atraumatic.      Right Ear: External ear normal.     Left Ear: External ear normal.     Nose: Nose normal.     Mouth/Throat:     Mouth: Mucous membranes are dry.  Eyes:     Extraocular Movements: Extraocular movements intact.     Conjunctiva/sclera: Conjunctivae normal.     Pupils: Pupils are equal, round, and reactive to light.  Neck:     Musculoskeletal: Normal range of motion and neck supple.  Cardiovascular:     Rate and Rhythm: Normal rate and regular rhythm.     Pulses: Normal pulses.     Heart sounds: Normal heart sounds.  Pulmonary:     Effort: Pulmonary effort is normal.     Breath sounds: Normal breath sounds.  Abdominal:     General: Abdomen is flat. Bowel sounds are normal.     Palpations: Abdomen is soft.  Musculoskeletal: Normal range of motion.  Skin:    General: Skin is warm.     Capillary Refill: Capillary refill takes less than 2 seconds.  Neurological:     General: No focal deficit present.      Mental Status: He is alert and oriented to person, place, and time.  Psychiatric:        Mood and Affect: Mood normal.        Behavior: Behavior normal.        Thought Content: Thought content  normal.        Judgment: Judgment normal.      ED Treatments / Results  Labs (all labs ordered are listed, but only abnormal results are displayed) Labs Reviewed  COMPREHENSIVE METABOLIC PANEL - Abnormal; Notable for the following components:      Result Value   Glucose, Bld 233 (*)    Creatinine, Ser 1.58 (*)    Total Protein 6.3 (*)    Albumin 3.4 (*)    GFR calc non Af Amer 39 (*)    GFR calc Af Amer 46 (*)    All other components within normal limits  URINALYSIS, ROUTINE W REFLEX MICROSCOPIC - Abnormal; Notable for the following components:   Protein, ur 30 (*)    Bacteria, UA RARE (*)    All other components within normal limits  CBG MONITORING, ED - Abnormal; Notable for the following components:   Glucose-Capillary 238 (*)    All other components within normal limits  TROPONIN I (HIGH SENSITIVITY) - Abnormal; Notable for the following components:   Troponin I (High Sensitivity) 20 (*)    All other components within normal limits  SARS CORONAVIRUS 2  CBC WITH DIFFERENTIAL/PLATELET  PROTIME-INR  CBG MONITORING, ED  TROPONIN I (HIGH SENSITIVITY)    EKG EKG Interpretation  Date/Time:  Sunday May 09 2019 10:59:50 EDT Ventricular Rate:  91 PR Interval:    QRS Duration: 157 QT Interval:  400 QTC Calculation: 484 R Axis:   -71 Text Interpretation:  Sinus rhythm Atrial premature complex Left bundle branch block No significant change since last tracing Confirmed by Isla Pence [phone]) on 05/09/2019 11:25:53 AM   Radiology Dg Chest 2 View  Result Date: 05/09/2019 CLINICAL DATA:  Syncope. EXAM: CHEST - 2 VIEW COMPARISON:  06/17/2018 FINDINGS: Stable mild cardiomegaly. Aortic atherosclerosis. Both lungs are clear. IMPRESSION: Stable mild cardiomegaly. No active lung  disease. Electronically Signed   By: Marlaine Hind M.D.   On: 05/09/2019 11:34    Procedures .Marland KitchenLaceration Repair  Date/Time: 05/09/2019 3:24 PM Performed by: Isla Pence, MD Authorized by: Isla Pence, MD   Consent:    Consent obtained:  Verbal   Consent given by:  Patient   Risks discussed:  Pain   Alternatives discussed:  No treatment Anesthesia (see MAR for exact dosages):    Anesthesia method:  None Laceration details:    Location:  Face   Face location:  Chin   Length (cm):  1 Repair type:    Repair type:  Simple Pre-procedure details:    Preparation:  Patient was prepped and draped in usual sterile fashion Treatment:    Area cleansed with:  Saline   Amount of cleaning:  Standard   Irrigation solution:  Sterile saline Skin repair:    Repair method:  Tissue adhesive Approximation:    Approximation:  Close Post-procedure details:    Dressing:  Open (no dressing)   Patient tolerance of procedure:  Tolerated well, no immediate complications   (including critical care time)  Medications Ordered in ED Medications  0.9 %  sodium chloride infusion ( Intravenous New Bag/Given 05/09/19 1158)  sodium chloride 0.9 % bolus 500 mL (0 mLs Intravenous Stopped 05/09/19 1406)     Initial Impression / Assessment and Plan / ED Course  I have reviewed the triage vital signs and the nursing notes.  Pertinent labs & imaging results that were available during my care of the patient were reviewed by me and considered in my medical decision making (see chart  for details).   Pt is feeling much better.  Troponin did elevate slightly, but not more than 20.  He has not had any cp while here.  It is likely due to ckd.  Pt knows to return if worse.  F/u with pcp.      Final Clinical Impressions(s) / ED Diagnoses   Final diagnoses:  Orthostatic hypotension  Chin laceration, initial encounter  CKD (chronic kidney disease) stage 3, GFR 30-59 ml/min [facility])    ED Discharge Orders     None       Isla Pence, MD 05/09/19 1527

## 2019-05-09 NOTE — ED Notes (Signed)
Pt ambulated independently in hallway with steady gait.

## 2019-05-09 NOTE — ED Notes (Signed)
Patient transported to X-ray 

## 2019-05-12 ENCOUNTER — Other Ambulatory Visit: Payer: Self-pay

## 2019-05-12 NOTE — Patient Outreach (Signed)
[place] [facility]) Care Management  05/12/2019  John Hines 02/15/1932 [id number]   Referral Date: 05/12/2019 Referral Source: Um referral Referral Reason: Medication assistance Entresto and atorvastatin   Outreach Attempt: spoke with patient. He is able to verify HIPAA. Discussed reason for referral. He states that he is now is the doughnut hole and needs assistance with medications Entresto and Atorvastatin.     Social: Patient lives in his home, he lost his wife about 1 year ago.  He states that his granddaughters live with him and son helps as well.  Patient is independent and still drives and works daily at [employer].     Conditions: Patient admits to HTN, HF, Hyperlipidemia, has 1 kidney, and back problems.  Patient states he went to the ED as he almost passed out.  He states he was dehydrated.  Encouraged patient to eat and drink plenty to avoid episodes. He verbalized understanding.  He states otherwise he is doing well.     Medications: Patient takes medication but is in the doughnut hole causing some trouble affording his medications.     Appointments: Patient sees physician regularly and has no transportation problems.   Advanced Directives: Patient does not have an advanced directive.   Consent: Discussed [facility] services and how we can support patient.  He is agreeable to pharmacy only at this time.     Plan: RN CM will refer to pharmacy for medication assistance.     Jone Baseman, RN, MSN [facility] Care Management Care Management Coordinator Direct Line [phone] Toll Free: [phone]  Fax: [phone]

## 2019-05-13 ENCOUNTER — Other Ambulatory Visit: Payer: Self-pay | Admitting: Pharmacist

## 2019-05-13 NOTE — Patient Outreach (Signed)
[place] [facility]) Care Management  [place]   05/13/2019  GIANNIS CORPUZ [birth year] [id number]  Reason for referral: Medication Assistance (Atorvastin and Delene Loll)  Referral source: [facility] Department Current insurance: [employer]  Outreach:  Unsuccessful telephone call attempt #1 to patient. HIPAA compliant voicemail left requesting a return call  Plan:  -I will mail patient an unsuccessful outreach letter.  -I will make another outreach attempt to patient within 3-4 business days.    Ralene Bathe, PharmD, [place] [phone]

## 2019-05-18 ENCOUNTER — Other Ambulatory Visit: Payer: Self-pay | Admitting: Pharmacist

## 2019-05-18 ENCOUNTER — Ambulatory Visit: Payer: Self-pay | Admitting: Pharmacist

## 2019-05-18 NOTE — Patient Outreach (Signed)
[place] [facility]) Care Management  [place]  05/18/2019  John Hines 1932-05-28 [id number]   Reason for referral: Medication Assistance (Atorvastin and Delene Loll)  Referral source: [facility] Department Current insurance: [employer]  Outreach:  Unsuccessful telephone call attempt #2 to patient.   HIPAA compliant voicemail left requesting a return call  Plan:  -I will make another outreach attempt to patient within 3-4 business days.    Ralene Bathe, PharmD, [place] [phone]

## 2019-05-24 ENCOUNTER — Ambulatory Visit: Payer: Self-pay | Admitting: Pharmacist

## 2019-05-24 ENCOUNTER — Other Ambulatory Visit: Payer: Self-pay | Admitting: Pharmacist

## 2019-05-24 NOTE — Patient Outreach (Signed)
[place]) Care Management  [place]   05/24/2019  John Hines [birth year] [id number]  Reason for referral:Medication Assistance(Atorvastin and Delene Loll)  Referral source:[facility] Utilization Management Department Current insurance:[employer]  Outreach:  Unsuccessful telephone call attempt #3 to patient. HIPAA compliant voicemail left requesting a return call  Plan:  -I will close [place] case at this time as I have been unable to establish and/or maintain contact with patient.   Ralene Bathe, PharmD, [place] [phone]

## 2019-06-25 ENCOUNTER — Telehealth: Payer: Self-pay | Admitting: *Deleted

## 2019-06-25 ENCOUNTER — Ambulatory Visit (HOSPITAL_COMMUNITY)
Admission: EM | Admit: 2019-06-25 | Discharge: 2019-06-25 | Disposition: A | Discharge status: Home / Self Care | Payer: Medicare Other | Source: Home / Self Care | Attending: Family Medicine | Admitting: Family Medicine

## 2019-06-25 ENCOUNTER — Encounter (HOSPITAL_COMMUNITY): Payer: Self-pay

## 2019-06-25 ENCOUNTER — Encounter (HOSPITAL_COMMUNITY): Payer: Self-pay | Admitting: *Deleted

## 2019-06-25 ENCOUNTER — Other Ambulatory Visit: Payer: Self-pay

## 2019-06-25 ENCOUNTER — Inpatient Hospital Stay (HOSPITAL_COMMUNITY)
Admission: EM | Admit: 2019-06-25 | Discharge: 2019-06-28 | DRG: 309 | Disposition: A | Discharge status: Home / Self Care | Payer: Medicare Other | Attending: Cardiology | Admitting: Cardiology

## 2019-06-25 ENCOUNTER — Emergency Department (HOSPITAL_COMMUNITY): Payer: Medicare Other

## 2019-06-25 ENCOUNTER — Ambulatory Visit (INDEPENDENT_AMBULATORY_CARE_PROVIDER_SITE_OTHER): Payer: Medicare Other

## 2019-06-25 DIAGNOSIS — I4891 Unspecified atrial fibrillation: Secondary | ICD-10-CM | POA: Diagnosis present

## 2019-06-25 DIAGNOSIS — N179 Acute kidney failure, unspecified: Secondary | ICD-10-CM | POA: Diagnosis present

## 2019-06-25 DIAGNOSIS — I4892 Unspecified atrial flutter: Secondary | ICD-10-CM

## 2019-06-25 DIAGNOSIS — N400 Enlarged prostate without lower urinary tract symptoms: Secondary | ICD-10-CM | POA: Diagnosis present

## 2019-06-25 DIAGNOSIS — M109 Gout, unspecified: Secondary | ICD-10-CM | POA: Diagnosis present

## 2019-06-25 DIAGNOSIS — I5020 Unspecified systolic (congestive) heart failure: Secondary | ICD-10-CM | POA: Diagnosis present

## 2019-06-25 DIAGNOSIS — I447 Left bundle-branch block, unspecified: Secondary | ICD-10-CM | POA: Diagnosis present

## 2019-06-25 DIAGNOSIS — R7989 Other specified abnormal findings of blood chemistry: Secondary | ICD-10-CM | POA: Diagnosis not present

## 2019-06-25 DIAGNOSIS — I428 Other cardiomyopathies: Secondary | ICD-10-CM | POA: Diagnosis present

## 2019-06-25 DIAGNOSIS — R0602 Shortness of breath: Secondary | ICD-10-CM | POA: Diagnosis not present

## 2019-06-25 DIAGNOSIS — R000 Tachycardia, unspecified: Secondary | ICD-10-CM

## 2019-06-25 DIAGNOSIS — N183 Chronic kidney disease, stage 3 unspecified: Secondary | ICD-10-CM | POA: Diagnosis present

## 2019-06-25 DIAGNOSIS — Z7982 Long term (current) use of aspirin: Secondary | ICD-10-CM

## 2019-06-25 DIAGNOSIS — Z800 Family history of malignant neoplasm of digestive organs: Secondary | ICD-10-CM

## 2019-06-25 DIAGNOSIS — Z20828 Contact with and (suspected) exposure to other viral communicable diseases: Secondary | ICD-10-CM | POA: Diagnosis present

## 2019-06-25 DIAGNOSIS — Z8673 Personal history of transient ischemic attack (TIA), and cerebral infarction without residual deficits: Secondary | ICD-10-CM

## 2019-06-25 DIAGNOSIS — I509 Heart failure, unspecified: Secondary | ICD-10-CM | POA: Diagnosis not present

## 2019-06-25 DIAGNOSIS — Z8719 Personal history of other diseases of the digestive system: Secondary | ICD-10-CM

## 2019-06-25 DIAGNOSIS — Z79891 Long term (current) use of opiate analgesic: Secondary | ICD-10-CM

## 2019-06-25 DIAGNOSIS — Z79899 Other long term (current) drug therapy: Secondary | ICD-10-CM

## 2019-06-25 DIAGNOSIS — E785 Hyperlipidemia, unspecified: Secondary | ICD-10-CM | POA: Diagnosis present

## 2019-06-25 DIAGNOSIS — I130 Hypertensive heart and chronic kidney disease with heart failure and stage 1 through stage 4 chronic kidney disease, or unspecified chronic kidney disease: Secondary | ICD-10-CM | POA: Diagnosis present

## 2019-06-25 DIAGNOSIS — I471 Supraventricular tachycardia: Secondary | ICD-10-CM | POA: Diagnosis present

## 2019-06-25 LAB — COMPREHENSIVE METABOLIC PANEL
ALT: 29 U/L (ref 0–44)
AST: 25 U/L (ref 15–41)
Albumin: 3.5 g/dL (ref 3.5–5.0)
Alkaline Phosphatase: 92 U/L (ref 38–126)
Anion gap: 11 (ref 5–15)
BUN: 28 mg/dL — ABNORMAL HIGH (ref 8–23)
CO2: 24 mmol/L (ref 22–32)
Calcium: 9.5 mg/dL (ref 8.9–10.3)
Chloride: 106 mmol/L (ref 98–111)
Creatinine, Ser: 2.04 mg/dL — ABNORMAL HIGH (ref 0.61–1.24)
GFR calc Af Amer: 33 mL/min — ABNORMAL LOW (ref >60)
GFR calc non Af Amer: 29 mL/min — ABNORMAL LOW (ref >60)
Glucose, Bld: 109 mg/dL — ABNORMAL HIGH (ref 70–99)
Potassium: 4.3 mmol/L (ref 3.5–5.1)
Sodium: 141 mmol/L (ref 135–145)
Total Bilirubin: 0.4 mg/dL (ref 0.3–1.2)
Total Protein: 6.3 g/dL — ABNORMAL LOW (ref 6.5–8.1)

## 2019-06-25 LAB — CBC
HCT: 35.9 % — ABNORMAL LOW (ref 39.0–52.0)
Hemoglobin: 12.1 g/dL — ABNORMAL LOW (ref 13.0–17.0)
MCH: 29.7 pg (ref 26.0–34.0)
MCHC: 33.7 g/dL (ref 30.0–36.0)
MCV: 88 fL (ref 80.0–100.0)
Platelets: 199 10*3/uL (ref 150–400)
RBC: 4.08 MIL/uL — ABNORMAL LOW (ref 4.22–5.81)
RDW: 12.9 % (ref 11.5–15.5)
WBC: 4.8 10*3/uL (ref 4.0–10.5)
nRBC: 0 % (ref 0.0–0.2)

## 2019-06-25 LAB — MAGNESIUM: Magnesium: 1.9 mg/dL (ref 1.7–2.4)

## 2019-06-25 LAB — D-DIMER, QUANTITATIVE: D-Dimer, Quant: 0.82 ug/mL-FEU — ABNORMAL HIGH (ref 0.00–0.50)

## 2019-06-25 LAB — SARS CORONAVIRUS 2 BY RT PCR (HOSPITAL ORDER, PERFORMED IN ~~LOC~~ HOSPITAL LAB): SARS Coronavirus 2: NEGATIVE

## 2019-06-25 LAB — TROPONIN I (HIGH SENSITIVITY)
Troponin I (High Sensitivity): 88 ng/L — ABNORMAL HIGH (ref <18)
Troponin I (High Sensitivity): 90 ng/L — ABNORMAL HIGH (ref <18)

## 2019-06-25 LAB — PHOSPHORUS: Phosphorus: 3.4 mg/dL (ref 2.5–4.6)

## 2019-06-25 LAB — BRAIN NATRIURETIC PEPTIDE: B Natriuretic Peptide: 1148.7 pg/mL — ABNORMAL HIGH (ref 0.0–100.0)

## 2019-06-25 MED ORDER — METOPROLOL TARTRATE 5 MG/5ML IV SOLN
2.5000 mg | Freq: Once | INTRAVENOUS | Status: AC
  Administered 2019-06-25: 2.5 mg via INTRAVENOUS
  Filled 2019-06-25: qty 5

## 2019-06-25 MED ORDER — METOPROLOL TARTRATE 5 MG/5ML IV SOLN
2.5000 mg | Freq: Once | INTRAVENOUS | Status: AC
  Administered 2019-06-25: 2.5 mg via INTRAVENOUS

## 2019-06-25 MED ORDER — ADENOSINE 6 MG/2ML IV SOLN
6.0000 mg | Freq: Once | INTRAVENOUS | Status: AC
  Administered 2019-06-25: 6 mg via INTRAVENOUS
  Filled 2019-06-25: qty 2

## 2019-06-25 MED ORDER — SODIUM CHLORIDE 0.9 % IV BOLUS
500.0000 mL | Freq: Once | INTRAVENOUS | Status: AC
  Administered 2019-06-25: 500 mL via INTRAVENOUS

## 2019-06-25 MED ORDER — IOHEXOL 350 MG/ML SOLN
75.0000 mL | Freq: Once | INTRAVENOUS | Status: AC | PRN
  Administered 2019-06-25: 75 mL via INTRAVENOUS

## 2019-06-25 MED ORDER — SODIUM CHLORIDE 0.9% FLUSH
3.0000 mL | Freq: Once | INTRAVENOUS | Status: AC
  Administered 2019-06-25: 3 mL via INTRAVENOUS

## 2019-06-25 NOTE — ED Notes (Signed)
Patient is being discharged from the Urgent [place] and sent to the Emergency Department via personal vehicle driven by daughter. Per Dr. Meda Coffee, patient is stable but in need of higher level of care due to new onset atrial flutter, cardiology was consulted and cardioversion was recommended. Patient is aware and verbalizes understanding of plan of care.  Vitals:   06/25/19 1657 06/25/19 1832  BP: 124/87   Pulse:  (!) 159  Resp:    Temp:    SpO2:

## 2019-06-25 NOTE — ED Triage Notes (Signed)
The pt has been having chest pain since yesterday  Today he came in to ucc for the same  His heart rate is 160  On arrival  Sob yesterday n

## 2019-06-25 NOTE — ED Triage Notes (Signed)
Patient presents to Urgent Care with complaints of shortness of breath at night while he is flat since 8 days ago. Patient reports he thinks he may have been exposed to COVID-19 and would like to be tested today.

## 2019-06-25 NOTE — ED Provider Notes (Signed)
Medical screening examination/treatment/procedure(s) were conducted as a shared visit with non-physician practitioner(s) and myself.  I personally evaluated the patient during the encounter.  EKG Interpretation  Date/Time:  Friday June 25 2019 19:53:09 EDT Ventricular Rate:  162 PR Interval:    QRS Duration: 138 QT Interval:  270 QTC Calculation: 443 R Axis:   148 Text Interpretation:  Wide QRS tachycardia Right axis deviation Left ventricular hypertrophy with QRS widening and repolarization abnormality ( Cornell product ) Abnormal ECG Confirmed by Lacretia Leigh ([id number]) on 06/25/2019 8:17:4 PM 85 year old male presents today with shortness of breath and tachycardia times several days.  He is in a sinus tachycardia of 160.  We will give labetalol to attempt to slow patient's heart rate down.  Patient will have work-up including evaluation for CHF pulmonary embolism.  Will likely be admitted to the hospital   Lacretia Leigh, MD 06/25/19 2016

## 2019-06-25 NOTE — ED Provider Notes (Signed)
[facility] EMERGENCY DEPARTMENT Provider Note   CSN: [id number] Arrival date & time: 06/25/19  1926     History   Chief Complaint Chief Complaint  Patient presents with  . Tachycardia    HPI John Hines is a 85 y.o. male presenting for evaluation of chest pain and shortness of breath.  Patient states last week he started develop diarrhea.  He had several days of diarrhea before resolved without intervention.  Since then, he has been having increased shortness of breath and tiredness.  Today he developed chest pain.  He was seen in urgent care, found to be tachycardic and recommended he come to the ED for further evaluation.  He denies fevers, chills, cough, abdominal pain, urinary symptoms.  He denies history of tachycardia, instead states he has a history of bradycardia.  History of CHF, follows with Dr. Claiborne Billings.  Additional history of hypertension, hyperlipidemia, CKD, previous stroke.      HPI  Past Medical History:  Diagnosis Date  . Arthritis   . BPH (benign prostatic hyperplasia)   . CHF (congestive heart failure) ([place])    Nonischemic Cardiomyopathy. EF 25-35%  . Chronic kidney disease 05/20/2013   CKD 3  . Colon polyps   . Dyslipidemia   . Dysrhythmia 08/19/11   "low heart beat; maybe 25bpm; got RX; now 60bpm"  . ED (erectile dysfunction)   . Gout   . Heart murmur   . Hyperlipidemia   . Hypertension   . Low back pain 08/12/2013  . Nonfunctioning kidney Recognized 03/2006   Chronically and Severely Hydronephrotic and Nonfunctioning Right Kidney  . Solitary kidney Recognized 03/2006   Solitary Functioning Left Kidney  . Stroke [facility])    "mild stroke; dx'd 2010; don't know when it happened"  . Syncope and collapse 08/19/11   "shallow breathing; sweating horribly"    Patient Active Problem List   Diagnosis Date Noted  . Advance care planning 10/05/2018  . URI (upper respiratory infection) 10/05/2018  . Iron deficiency anemia 08/07/2017  .  BPH (benign prostatic hyperplasia) 03/21/2016  . Lower back pain 08/12/2013  . Eczema 08/12/2013  . Nonfunctioning kidney   . CHF (congestive heart failure) ([place])   . Chronic systolic heart failure ([place]) 03/11/2013  . Gout 03/11/2013  . Syncope 08/21/2011  . Chronic kidney disease, stage III (moderate) 08/21/2011  . Hyperglycemia 08/21/2011  . Left bundle branch block 08/21/2011  . Hyperlipidemia   . Hypertension   . Dyslipidemia     Past Surgical History:  Procedure Laterality Date  . CARDIOVASCULAR STRESS TEST  08/30/2010   Small to moderate fixed inferoseptal defect. Severe global hypokinesis with inferior akinesis and septal dyskinesis-likely secondary to a prominent LBBB. Non-diagnostic for ischemia due to persantine.  Marland Kitchen KNEE ARTHROSCOPY  1996   right  . TRANSTHORACIC ECHOCARDIOGRAM  08/20/2011   EF 45-50%, severe concentric hyperthrophy.        Home Medications    Prior to Admission medications   Medication Sig Start Date End Date Taking? Authorizing Provider  acetaminophen (TYLENOL) 500 MG tablet Take 500-1,000 mg by mouth every 8 (eight) hours as needed for mild pain or headache.    Yes [provider]  amLODipine (NORVASC) 5 MG tablet TAKE 1 TABLET BY MOUTH EVERY DAY Patient taking differently: Take 5 mg by mouth daily.  03/16/19  Yes Troy Sine, MD  aspirin EC 81 MG tablet Take 81 mg by mouth at bedtime.    Yes [provider]  atorvastatin (LIPITOR) 80 MG tablet TAKE 1 TABLET BY MOUTH EVERY DAY Patient taking differently: Take 80 mg by mouth at bedtime.  05/04/19  Yes Troy Sine, MD  carvedilol (COREG) 6.25 MG tablet Take 1 tablet (6.25 mg total) by mouth 2 (two) times daily with a meal. 01/08/19  Yes Troy Sine, MD  ELDERBERRY PO Take 1 tablet by mouth at bedtime.   Yes [provider]  ENTRESTO 97-103 MG TAKE 1 TABLET BY MOUTH TWICE A DAY Patient taking differently: Take 1 tablet by mouth 2 (two) times daily.  12/21/18  Yes  Troy Sine, MD  ferrous sulfate 325 (65 FE) MG tablet TAKE 1 TABLET (325 MG TOTAL) BY MOUTH DAILY. Patient taking differently: Take 325 mg by mouth at bedtime.  11/27/18  Yes Tonia Ghent, MD  Multiple Vitamins-Minerals (ONE-A-DAY VITACRAVES IMMUNITY) CHEW Chew 2 tablets by mouth 2 (two) times a week.    Yes [provider]  Omega-3 Fatty Acids (FISH OIL) 1000 MG CAPS Take 1,000 mg by mouth 3 (three) times a week.    Yes [provider]  spironolactone (ALDACTONE) 25 MG tablet Take 0.5 tablets (12.5 mg total) by mouth daily. 01/08/19 06/25/19 Yes Troy Sine, MD  benzonatate (TESSALON) 200 MG capsule Take 1 capsule (200 mg total) by mouth 3 (three) times daily as needed. Patient not taking: Reported on 06/25/2019 01/10/19   Tonia Ghent, MD  sildenafil (VIAGRA) 100 MG tablet Take 1 tablet (100 mg total) by mouth daily as needed for erectile dysfunction. 01/10/19   Tonia Ghent, MD  traMADol (ULTRAM) 50 MG tablet Take 1 tablet (50 mg total) by mouth every 12 (twelve) hours as needed. Patient not taking: Reported on 06/25/2019 02/25/19   Tonia Ghent, MD    Family History Family History  Problem Relation Age of Onset  . Early death Mother        died in child birth  . Stomach cancer Father        stomach    Social History Social History   Tobacco Use  . Smoking status: Never Smoker  . Smokeless tobacco: Never Used  Substance Use Topics  . Alcohol use: Yes  . Drug use: No     Allergies   Lisinopril, Nsaids, and Penicillins   Review of Systems Review of Systems  Respiratory: Positive for shortness of breath.   Cardiovascular: Positive for chest pain.  Gastrointestinal: Positive for diarrhea.  All other systems reviewed and are negative.    Physical Exam Updated Vital Signs BP 103/74   Pulse (!) 151   Temp 98.8 F (37.1 C)   Resp (!) 21   Ht 5\' 7"  (1.702 m)   Wt 78 kg   SpO2 97%   BMI 26.94 kg/m   Physical Exam Vitals signs and  nursing note reviewed.  Constitutional:      General: He is not in acute distress.    Appearance: He is well-developed.     Comments: Elderly male who appears nontoxic  HENT:     Head: Normocephalic and atraumatic.  Eyes:     Conjunctiva/sclera: Conjunctivae normal.     Pupils: Pupils are equal, round, and reactive to light.  Neck:     Musculoskeletal: Normal range of motion and neck supple.  Cardiovascular:     Rate and Rhythm: Regular rhythm. Tachycardia present.     Pulses: Normal pulses.     Comments: Tachycardic from 150-160 Pulmonary:  Effort: Pulmonary effort is normal. No respiratory distress.     Breath sounds: Normal breath sounds. No wheezing.     Comments: Clear lung sounds Abdominal:     General: There is no distension.     Palpations: Abdomen is soft. There is no mass.     Tenderness: There is no abdominal tenderness. There is no guarding or rebound.     Comments: No ttp of the abd  Musculoskeletal: Normal range of motion.     Comments: No leg pain or swelling  Skin:    General: Skin is warm and dry.     Capillary Refill: Capillary refill takes less than 2 seconds.  Neurological:     Mental Status: He is alert and oriented to person, place, and time.      ED Treatments / Results  Labs (all labs ordered are listed, but only abnormal results are displayed) Labs Reviewed  CBC - Abnormal; Notable for the following components:      Result Value   RBC 4.08 (*)    Hemoglobin 12.1 (*)    HCT 35.9 (*)    All other components within normal limits  COMPREHENSIVE METABOLIC PANEL - Abnormal; Notable for the following components:   Glucose, Bld 109 (*)    BUN 28 (*)    Creatinine, Ser 2.04 (*)    Total Protein 6.3 (*)    GFR calc non Af Amer 29 (*)    GFR calc Af Amer 33 (*)    All other components within normal limits  D-DIMER, QUANTITATIVE (NOT AT [facility]) - Abnormal; Notable for the following components:   D-Dimer, Quant 0.82 (*)    All other components  within normal limits  BRAIN NATRIURETIC PEPTIDE - Abnormal; Notable for the following components:   B Natriuretic Peptide 1,148.7 (*)    All other components within normal limits  TROPONIN I (HIGH SENSITIVITY) - Abnormal; Notable for the following components:   Troponin I (High Sensitivity) 88 (*)    All other components within normal limits  TROPONIN I (HIGH SENSITIVITY) - Abnormal; Notable for the following components:   Troponin I (High Sensitivity) 90 (*)    All other components within normal limits  SARS CORONAVIRUS 2 (HOSPITAL ORDER, [facility])  MAGNESIUM  PHOSPHORUS    EKG EKG Interpretation  Date/Time:  Friday June 25 2019 19:53:09 EDT Ventricular Rate:  162 PR Interval:    QRS Duration: 138 QT Interval:  270 QTC Calculation: 443 R Axis:   148 Text Interpretation:  Wide QRS tachycardia Right axis deviation Left ventricular hypertrophy with QRS widening and repolarization abnormality ( Cornell product ) Abnormal ECG Confirmed by Lacretia Leigh ([id number]) on 06/25/2019 8:11:32 PM   Radiology Dg Chest 2 View  Result Date: 06/25/2019 CLINICAL DATA:  Dyspnea.  Chest heaviness. EXAM: CHEST - 2 VIEW COMPARISON:  05/09/2019 chest radiograph. FINDINGS: Stable cardiomediastinal silhouette with borderline mild cardiomegaly. No pneumothorax. No pleural effusion. Lungs appear clear, with no acute consolidative airspace disease and no pulmonary edema. IMPRESSION: Borderline mild cardiomegaly, without pulmonary edema. No active pulmonary disease. Electronically Signed   By: Ilona Sorrel M.D.   On: 06/25/2019 18:34   Ct Angio Chest Pe W And/or Wo Contrast  Result Date: 06/25/2019 CLINICAL DATA:  Shortness of breath he had EXAM: CT ANGIOGRAPHY CHEST WITH CONTRAST TECHNIQUE: Multidetector CT imaging of the chest was performed using the standard protocol during bolus administration of intravenous contrast. Multiplanar CT image reconstructions and MIPs were obtained  to evaluate the vascular anatomy. CONTRAST:  30mL OMNIPAQUE IOHEXOL 350 MG/ML SOLN COMPARISON:  None. FINDINGS: Cardiovascular: There is a optimal opacification of the pulmonary arteries. There is no central,segmental, or subsegmental filling defects within the pulmonary arteries. There is mild cardiomegaly. Left ventricular enlargement is. There is normal three-vessel brachiocephalic anatomy without proximal stenosis. Scattered atherosclerosis is noted. Mediastinum/Nodes: No hilar, mediastinal, or axillary adenopathy. Thyroid gland, trachea, and esophagus demonstrate no significant findings. Lungs/Pleura: Minimal streaky atelectasis seen at the bilateral lower lungs. No pleural effusion or pneumothorax. No airspace consolidation. Upper Abdomen: No acute abnormalities present in the visualized portions of the upper abdomen. Musculoskeletal: No chest wall abnormality. No acute or significant osseous findings. Review of the MIP images confirms the above findings. IMPRESSION: 1. No central, segmental, or subsegmental pulmonary embolism. 2. Mild cardiomegaly.  With left ventricular enlargement 3. Minimal bibasilar streaky atelectasis. Electronically Signed   By: Prudencio Pair M.D.   On: 06/25/2019 22:36    Procedures .Critical Care Performed by: Franchot Heidelberg, PA-C Authorized by: Franchot Heidelberg, PA-C   Critical care provider statement:    Critical care time (minutes):  50   Critical care time was exclusive of:  Separately billable procedures and treating other patients and teaching time   Critical care was necessary to treat or prevent imminent or life-threatening deterioration of the following conditions:  Cardiac failure   Critical care was time spent personally by me on the following activities:  Blood draw for specimens, development of treatment plan with patient or surrogate, discussions with consultants, evaluation of patient's response to treatment, examination of patient, obtaining history from  patient or surrogate, ordering and performing treatments and interventions, ordering and review of laboratory studies, ordering and review of radiographic studies, pulse oximetry, re-evaluation of patient's condition and review of old charts   I assumed direction of critical care for this patient from another provider in my specialty: no   Comments:     Pt tachycardic. Received multiple rounds of medication without improvement. Adenosine given without improvement. Pt to be assessed by cardiology.    (including critical care time)  Medications Ordered in ED Medications  sodium chloride flush (NS) 0.9 % injection 3 mL (3 mLs Intravenous Given 06/25/19 2020)  metoprolol tartrate (LOPRESSOR) injection 2.5 mg (2.5 mg Intravenous Given 06/25/19 2019)  sodium chloride 0.9 % bolus 500 mL (0 mLs Intravenous Stopped 06/25/19 2117)  metoprolol tartrate (LOPRESSOR) injection 2.5 mg (2.5 mg Intravenous Given 06/25/19 2046)  metoprolol tartrate (LOPRESSOR) injection 2.5 mg (2.5 mg Intravenous Given 06/25/19 2113)  adenosine (ADENOCARD) 6 MG/2ML injection 6 mg (6 mg Intravenous Given 06/25/19 2238)  iohexol (OMNIPAQUE) 350 MG/ML injection 75 mL (75 mLs Intravenous Contrast Given 06/25/19 2215)     Initial Impression / Assessment and Plan / ED Course  I have reviewed the triage vital signs and the nursing notes.  Pertinent labs & imaging results that were available during my care of the patient were reviewed by me and considered in my medical decision making (see chart for details).        Patient presenting for evaluation of chest pain and shortness of breath.  Physical exam shows patient who is tachycardic around 160.  However, blood pressure is stable and patient appears nontoxic.  However, considering elevated heart rate and patient's age, increased concern.  Consider dehydration due to diarrhea versus PE versus CHF exacerbation versus ACS.  Case discussed with attending, Dr. Zenia Resides evaluated the patient.   Will obtain labs, EKG, CTA.  Will hydrate gently.  Metoprolol for heart rate.  3 doses of 2.5 mg Lopressor given without improvement or change in heart rate.  Will consult cardiology.  Discussed with Dr. Neena Rhymes for cardiology, who reccommended adenosine to further evaluation the rhythm when slower.  \ X-ray obtained the urgent care viewed interpreted by me, shows mild cardiomegaly without obvious pleural effusions.  Labs show slight elevation in creatinine at 2, baseline 1.5.  Trop mildly elevated at 88, this is likely demand ischemia.  Dimer slightly elevated 0.82, although when age-adjusted this is normal.  BNP elevated at 1,000.  However patient is not floridly in heart failure, no peripheral edema or crackles.   CTA without PE, once again demonstrates cardiomegaly without pleural effusions. covid negative.   No significant change in HR with adenosine. discussed with cardiology, who will admit the pt.    Final Clinical Impressions(s) / ED Diagnoses   Final diagnoses:  Tachycardia    ED Discharge Orders    None       Franchot Heidelberg, PA-C 06/25/19 2336    Lacretia Leigh, MD 06/28/19 1701

## 2019-06-25 NOTE — Discharge Instructions (Signed)
I spoke with Dr Gilman Schmidt in cardiology.  He says you need to go to the ER tonight to get your heart rate under control.

## 2019-06-25 NOTE — ED Provider Notes (Signed)
[place]    CSN: [id number] Arrival date & time: 06/25/19  1557      History   Chief Complaint Chief Complaint  Patient presents with  . Shortness of Breath    HPI John Hines is a 85 y.o. male.   HPI  Pleasant 85 year old man who is under the care of cardiology for chronic congestive heart failure.  He states that he has had shortness of breath at night while he tries to lie flat for the last 8 days.  He thinks this might be COVID-19 so he is here requesting testing.  No known direct exposure.  No fever chills.  No body aches.  No loss of taste or smell.  No nausea vomiting diarrhea.  No coughing, or shortness of breath during the daytime.  Right now, he states he feels well. He is compliant with his medical visits and his medications. He had an emergency room visit mid August for presyncope.  That chart is reviewed, results, chest x-ray, and EKG  Past Medical History:  Diagnosis Date  . Arthritis   . BPH (benign prostatic hyperplasia)   . CHF (congestive heart failure) ([place])    Nonischemic Cardiomyopathy. EF 25-35%  . Chronic kidney disease 05/20/2013   CKD 3  . Colon polyps   . Dyslipidemia   . Dysrhythmia 08/19/11   "low heart beat; maybe 25bpm; got RX; now 60bpm"  . ED (erectile dysfunction)   . Gout   . Heart murmur   . Hyperlipidemia   . Hypertension   . Low back pain 08/12/2013  . Nonfunctioning kidney Recognized 03/2006   Chronically and Severely Hydronephrotic and Nonfunctioning Right Kidney  . Solitary kidney Recognized 03/2006   Solitary Functioning Left Kidney  . Stroke [facility])    "mild stroke; dx'd 2010; don't know when it happened"  . Syncope and collapse 08/19/11   "shallow breathing; sweating horribly"    Patient Active Problem List   Diagnosis Date Noted  . Advance care planning 10/05/2018  . URI (upper respiratory infection) 10/05/2018  . Iron deficiency anemia 08/07/2017  . BPH (benign prostatic hyperplasia) 03/21/2016  .  Lower back pain 08/12/2013  . Eczema 08/12/2013  . Nonfunctioning kidney   . CHF (congestive heart failure) ([place])   . Chronic systolic heart failure ([place]) 03/11/2013  . Gout 03/11/2013  . Syncope 08/21/2011  . Chronic kidney disease, stage III (moderate) 08/21/2011  . Hyperglycemia 08/21/2011  . Left bundle branch block 08/21/2011  . Hyperlipidemia   . Hypertension   . Dyslipidemia     Past Surgical History:  Procedure Laterality Date  . CARDIOVASCULAR STRESS TEST  08/30/2010   Small to moderate fixed inferoseptal defect. Severe global hypokinesis with inferior akinesis and septal dyskinesis-likely secondary to a prominent LBBB. Non-diagnostic for ischemia due to persantine.  Marland Kitchen KNEE ARTHROSCOPY  1996   right  . TRANSTHORACIC ECHOCARDIOGRAM  08/20/2011   EF 45-50%, severe concentric hyperthrophy.       Home Medications    Prior to Admission medications   Medication Sig Start Date End Date Taking? Authorizing Provider  acetaminophen (TYLENOL) 500 MG tablet Take 500-1,000 mg by mouth every 8 (eight) hours as needed for mild pain or headache.     [provider]  amLODipine (NORVASC) 5 MG tablet TAKE 1 TABLET BY MOUTH EVERY DAY Patient taking differently: Take 5 mg by mouth daily.  03/16/19   Troy Sine, MD  aspirin EC 81 MG tablet Take 81 mg by mouth  at bedtime.     [provider]  atorvastatin (LIPITOR) 80 MG tablet TAKE 1 TABLET BY MOUTH EVERY DAY Patient taking differently: Take 80 mg by mouth at bedtime.  05/04/19   Troy Sine, MD  benzonatate (TESSALON) 200 MG capsule Take 1 capsule (200 mg total) by mouth 3 (three) times daily as needed. Patient not taking: Reported on 06/25/2019 01/10/19   Tonia Ghent, MD  carvedilol (COREG) 6.25 MG tablet Take 1 tablet (6.25 mg total) by mouth 2 (two) times daily with a meal. 01/08/19   Troy Sine, MD  ENTRESTO 97-103 MG TAKE 1 TABLET BY MOUTH TWICE A DAY Patient taking differently: Take 1 tablet by  mouth 2 (two) times daily.  12/21/18   Troy Sine, MD  ferrous sulfate 325 (65 FE) MG tablet TAKE 1 TABLET (325 MG TOTAL) BY MOUTH DAILY. 11/27/18   Tonia Ghent, MD  Omega-3 Fatty Acids (FISH OIL) 1000 MG CAPS Take 1,000 mg by mouth 3 (three) times a week.     [provider]  sildenafil (VIAGRA) 100 MG tablet Take 1 tablet (100 mg total) by mouth daily as needed for erectile dysfunction. 01/10/19   Tonia Ghent, MD  spironolactone (ALDACTONE) 25 MG tablet Take 0.5 tablets (12.5 mg total) by mouth daily. 01/08/19 06/25/19  Troy Sine, MD  traMADol (ULTRAM) 50 MG tablet Take 1 tablet (50 mg total) by mouth every 12 (twelve) hours as needed. Patient not taking: Reported on 06/25/2019 02/25/19   Tonia Ghent, MD    Family History Family History  Problem Relation Age of Onset  . Early death Mother        died in child birth  . Stomach cancer Father        stomach    Social History Social History   Tobacco Use  . Smoking status: Never Smoker  . Smokeless tobacco: Never Used  Substance Use Topics  . Alcohol use: Yes  . Drug use: No     Allergies   Lisinopril, Nsaids, and Penicillins   Review of Systems Review of Systems  Constitutional: Negative for chills and fever.  HENT: Negative for ear pain and sore throat.   Eyes: Negative for pain and visual disturbance.  Respiratory: Positive for shortness of breath. Negative for cough and chest tightness.   Cardiovascular: Negative for chest pain, palpitations and leg swelling.  Gastrointestinal: Negative for abdominal distention, abdominal pain and vomiting.  Genitourinary: Negative for dysuria and hematuria.  Musculoskeletal: Negative for arthralgias and back pain.  Skin: Negative for color change and rash.  Neurological: Negative for seizures and syncope.  All other systems reviewed and are negative.    Physical Exam Triage Vital Signs ED Triage Vitals  Enc Vitals Group     BP 06/25/19 1657 124/87      Pulse Rate 06/25/19 1653 (!) 156     Resp 06/25/19 1653 18     Temp 06/25/19 1653 98.6 F (37 C)     Temp Source 06/25/19 1653 Oral     SpO2 06/25/19 1653 99 %     Weight --      Height --      Head Circumference --      Peak Flow --      Pain Score 06/25/19 1651 0     Pain Loc --      Pain Edu? --      Excl. in [place]? --    No data  found.  Updated Vital Signs BP 124/87   Pulse (!) 159   Temp 98.6 F (37 C) (Oral)   Resp 18   SpO2 99%   Visual Acuity     Physical Exam Constitutional:      General: He is not in acute distress.    Appearance: He is well-developed.     Comments: Appears vigorous.  Smiling.  HENT:     Head: Normocephalic and atraumatic.  Eyes:     Conjunctiva/sclera: Conjunctivae normal.     Pupils: Pupils are equal, round, and reactive to light.  Neck:     Musculoskeletal: Normal range of motion and neck supple.  Cardiovascular:     Rate and Rhythm: Normal rate and regular rhythm.     Heart sounds: Normal heart sounds.  Pulmonary:     Effort: Pulmonary effort is normal. No respiratory distress.     Breath sounds: Examination of the right-lower field reveals rales. Examination of the left-lower field reveals rales. Rales present.  Abdominal:     General: There is no distension.     Palpations: Abdomen is soft.  Musculoskeletal: Normal range of motion.     Right lower leg: No edema.     Left lower leg: No edema.  Skin:    General: Skin is warm and dry.  Neurological:     Mental Status: He is alert.  Psychiatric:        Mood and Affect: Mood normal.        Behavior: Behavior normal.      UC Treatments / Results  Labs (all labs ordered are listed, but only abnormal results are displayed) Labs Reviewed  NOVEL CORONAVIRUS, NAA (HOSP ORDER, SEND-OUT TO REF LAB; TAT 18-24 HRS)    EKG-initial reading was sinus tachycardia, left bundle branch block, nonspecific ST T wave changes.  It looked similar to his last EKG but much higher rate.  I called  the cardiologist on call and spoke with Dr. Nechama Guard.  He states that this is atrial flutter and the patient needs to be sent to the hospital for rate control and possible cardioversion.   Radiology Dg Chest 2 View  Result Date: 06/25/2019 CLINICAL DATA:  Dyspnea.  Chest heaviness. EXAM: CHEST - 2 VIEW COMPARISON:  05/09/2019 chest radiograph. FINDINGS: Stable cardiomediastinal silhouette with borderline mild cardiomegaly. No pneumothorax. No pleural effusion. Lungs appear clear, with no acute consolidative airspace disease and no pulmonary edema. IMPRESSION: Borderline mild cardiomegaly, without pulmonary edema. No active pulmonary disease. Electronically Signed   By: Ilona Sorrel M.D.   On: 06/25/2019 18:34    Procedures Procedures (including critical care time)  Medications Ordered in UC Medications - No data to display  Initial Impression / Assessment and Plan / UC Course  I have reviewed the triage vital signs and the nursing notes.  Pertinent labs & imaging results that were available during my care of the patient were reviewed by me and considered in my medical decision making (see chart for details).     Inform the patient' of cardiology assessment.  Patient was taken to the ER for additional treatment Final Clinical Impressions(s) / UC Diagnoses   Final diagnoses:  SOB (shortness of breath)  New onset atrial flutter [facility])     Discharge Instructions     I spoke with Dr Gilman Schmidt in cardiology.  He says you need to go to the ER tonight to get your heart rate under control.    ED Prescriptions    None  PDMP not reviewed this encounter.   Raylene Everts, MD 06/25/19 2038

## 2019-06-25 NOTE — Telephone Encounter (Signed)
I think that UC makes sense based on our protocols.  If he had mild sx, then outpatient/drive up testing and observation would make sense but in the meantime UC would be best.  I'll await the notes.  Thanks.

## 2019-06-25 NOTE — H&P (Signed)
CARDIOLOGY H&P  HPI: LEDON WEIHE is a 85 y.o. male w/ history of HFrEF, CKD3, HLD, gout, HTN, and LBBB presents with tachycardia.   Patient reports approximately 2 to 3 weeks of symptoms of fatigue and shortness of breath.  He has a known diagnosis of heart failure with reduced ejection fraction but states that he typically does not become short of breath and does not typically have issues with fluid retention.  Over the past 2 to 3 weeks however this is changed.  He has noticed that he has not been able to do his normal physical activities like mowing the lawn, which he normally has no issues with.  He has had no changes to his medications and does not recall any recent missed doses.  He works at [employer] and has had multiple known exposures to [place], therefore he initially presented to urgent care today requesting COVID testing.  He denies fevers, chills, cough, chest pain, lightheadedness, dizziness, syncope, presyncope, palpitations. At urgent care, an ECG was performed which revealed tachycardia with a heart rate of around 150 bpm.  Given this finding he was sent to the emergency department for further management.  On arrival in the emergency department, the patient was found to be normotensive with systolic blood pressures ranging in the 110s to 120s.  His heart rate was in the 150s to 160s.  Oxygen saturation and temperature were normal.  An ECG revealed wide-complex tachycardia and his normal left bundle branch block pattern suggestive of atrial flutter.  He was given adenosine x1 which did not break his rhythm and only resulted in a very short pause, during which flutter waves were not appreciable.  He was thus admitted to cardiology for further management.    Review of Systems:     Cardiac Review of Systems: {Y] = yes [ ]  = no  Chest Pain [    ]  Resting SOB [   ] Exertional SOB  [Y]  Orthopnea [  ]   Pedal Edema [   ]    Palpitations [  ] Syncope  [  ]   Presyncope [    ]  General Review of Systems: [Y] = yes [  ]=no Constitional: recent weight change [  ]; anorexia [  ]; fatigue [  ]; nausea [  ]; night sweats [  ]; fever [  ]; or chills [  ];                                                                     Dental: poor dentition[  ];   Eye : blurred vision [  ]; diplopia [   ]; vision changes [  ];  Amaurosis fugax[  ]; Resp: cough [  ];  wheezing[  ];  hemoptysis[  ]; shortness of breath[  ]; paroxysmal nocturnal dyspnea[  ]; dyspnea on exertion[  ]; or orthopnea[  ];  GI:  gallstones[  ], vomiting[  ];  dysphagia[  ]; melena[  ];  hematochezia [  ]; heartburn[  ];   GU: kidney stones [  ]; hematuria[  ];   dysuria [  ];  nocturia[  ];  Skin: rash [  ], swelling[  ];, hair loss[  ];  peripheral edema[  ];  or itching[  ]; Musculosketetal: myalgias[  ];  joint swelling[  ];  joint erythema[  ];  joint pain[  ];  back pain[  ];  Heme/Lymph: bruising[  ];  bleeding[  ];  anemia[  ];  Neuro: TIA[  ];  headaches[  ];  stroke[  ];  vertigo[  ];  seizures[  ];   paresthesias[  ];  difficulty walking[  ];  Psych:depression[  ]; anxiety[  ];  Endocrine: diabetes[  ];  thyroid dysfunction[  ];  Other:  Past Medical History:  Diagnosis Date   Arthritis    BPH (benign prostatic hyperplasia)    CHF (congestive heart failure) ([place])    Nonischemic Cardiomyopathy. EF 25-35%   Chronic kidney disease 05/20/2013   CKD 3   Colon polyps    Dyslipidemia    Dysrhythmia 08/19/11   "low heart beat; maybe 25bpm; got RX; now 60bpm"   ED (erectile dysfunction)    Gout    Heart murmur    Hyperlipidemia    Hypertension    Low back pain 08/12/2013   Nonfunctioning kidney Recognized 03/2006   Chronically and Severely Hydronephrotic and Nonfunctioning Right Kidney   Solitary kidney Recognized 03/2006   Solitary Functioning Left Kidney   Stroke [facility])    "mild stroke; dx'd 2010; don't know when it happened"   Syncope and collapse 08/19/11    "shallow breathing; sweating horribly"   Prior to Admission medications   Medication Sig Start Date End Date Taking? Authorizing Provider  acetaminophen (TYLENOL) 500 MG tablet Take 500-1,000 mg by mouth every 8 (eight) hours as needed for mild pain or headache.    Yes [provider]  amLODipine (NORVASC) 5 MG tablet TAKE 1 TABLET BY MOUTH EVERY DAY Patient taking differently: Take 5 mg by mouth daily.  03/16/19  Yes Troy Sine, MD  aspirin EC 81 MG tablet Take 81 mg by mouth at bedtime.    Yes [provider]  atorvastatin (LIPITOR) 80 MG tablet TAKE 1 TABLET BY MOUTH EVERY DAY Patient taking differently: Take 80 mg by mouth at bedtime.  05/04/19  Yes Troy Sine, MD  carvedilol (COREG) 6.25 MG tablet Take 1 tablet (6.25 mg total) by mouth 2 (two) times daily with a meal. 01/08/19  Yes Troy Sine, MD  ELDERBERRY PO Take 1 tablet by mouth at bedtime.   Yes [provider]  ENTRESTO 97-103 MG TAKE 1 TABLET BY MOUTH TWICE A DAY Patient taking differently: Take 1 tablet by mouth 2 (two) times daily.  12/21/18  Yes Troy Sine, MD  ferrous sulfate 325 (65 FE) MG tablet TAKE 1 TABLET (325 MG TOTAL) BY MOUTH DAILY. Patient taking differently: Take 325 mg by mouth at bedtime.  11/27/18  Yes Tonia Ghent, MD  Multiple Vitamins-Minerals (ONE-A-DAY VITACRAVES IMMUNITY) CHEW Chew 2 tablets by mouth 2 (two) times a week.    Yes [provider]  Omega-3 Fatty Acids (FISH OIL) 1000 MG CAPS Take 1,000 mg by mouth 3 (three) times a week.    Yes [provider]  spironolactone (ALDACTONE) 25 MG tablet Take 0.5 tablets (12.5 mg total) by mouth daily. 01/08/19 06/25/19 Yes Troy Sine, MD  benzonatate (TESSALON) 200 MG capsule Take 1 capsule (200 mg total) by mouth 3 (three) times daily as needed. Patient not taking: Reported on 06/25/2019 01/10/19  Tonia Ghent, MD  sildenafil (VIAGRA) 100 MG tablet Take 1 tablet (100 mg total) by mouth daily as  needed for erectile dysfunction. 01/10/19   Tonia Ghent, MD  traMADol (ULTRAM) 50 MG tablet Take 1 tablet (50 mg total) by mouth every 12 (twelve) hours as needed. Patient not taking: Reported on 06/25/2019 02/25/19   Tonia Ghent, MD     Allergies  Allergen Reactions   Lisinopril Other (See Comments) and Cough    Possible history of cough on med.    Nsaids Other (See Comments)    Avoid due to renal status   Penicillins Swelling and Rash    Has patient had a PCN reaction causing immediate rash, facial/tongue/throat swelling, SOB or lightheadedness with hypotension: Yes- chest swells Has patient had a PCN reaction causing severe rash involving mucus membranes or skin necrosis: No Has patient had a PCN reaction that required hospitalization: No Has patient had a PCN reaction occurring within the last 10 years: No If all of the above answers are "NO", then may proceed with Cephalosporin use.     Social History   Socioeconomic History   Marital status: Widowed    Spouse name: Not on file   Number of children: Not on file   Years of education: Not on file   Highest education level: Not on file  Occupational History   Not on file  Social Needs   Financial resource strain: Not on file   Food insecurity    Worry: Not on file    Inability: Not on file   Transportation needs    Medical: Not on file    Non-medical: Not on file  Tobacco Use   Smoking status: Never Smoker   Smokeless tobacco: Never Used  Substance and Sexual Activity   Alcohol use: Yes   Drug use: No   Sexual activity: Yes  Lifestyle   Physical activity    Days per week: Not on file    Minutes per session: Not on file   Stress: Not on file  Relationships   Social connections    Talks on phone: Not on file    Gets together: Not on file    Attends religious service: Not on file    Active member of club or organization: Not on file    Attends meetings of clubs or organizations: Not on  file    Relationship status: Not on file   Intimate partner violence    Fear of current or ex partner: Not on file    Emotionally abused: Not on file    Physically abused: Not on file    Forced sexual activity: Not on file  Other Topics Concern   Not on file  Social History Narrative   Grew up in [place].  His mother died in childbirth when he was an infant.   Widowed 2019.  Was previously married for 14 years.   He works Corporate treasurer for [employer].    Family History  Problem Relation Age of Onset   Early death Mother        died in child birth   Stomach cancer Father        stomach    PHYSICAL EXAM: Vitals:   06/25/19 2300 06/25/19 2345  BP: 103/79   Pulse: (!) 153 98  Resp: 20 20  Temp:    SpO2: 95% 95%   General:  Well appearing. No respiratory difficulty. Pleasant. HEENT: normal Neck: supple. JVP  elevated to ~10cm H2O. Carotids 2+ bilat; no bruits. No lymphadenopathy or thryomegaly appreciated. Cor: PMI nondisplaced. Tachycardic. Summation gallop. Lungs: few crackles at the R lung base otherwise clear Abdomen: soft, nontender, nondistended. No hepatosplenomegaly. No bruits or masses. Good bowel sounds. Extremities: no cyanosis, clubbing, rash, edema; warm Neuro: alert & oriented x 3, cranial nerves grossly intact. moves all 4 extremities w/o difficulty. Affect pleasant.  ECG: HR 100 bpm, likely atypical atrial flutter, LBBB, normal axis, possible lateral Q waves  Results for orders placed or performed during the hospital encounter of 06/25/19 (from the past 24 hour(s))  CBC     Status: Abnormal   Collection Time: 06/25/19  8:08 PM  Result Value Ref Range   WBC 4.8 4.0 - 10.5 K/uL   RBC 4.08 (L) 4.22 - 5.81 MIL/uL   Hemoglobin 12.1 (L) 13.0 - 17.0 g/dL   HCT 35.9 (L) 39.0 - 52.0 %   MCV 88.0 80.0 - 100.0 fL   MCH 29.7 26.0 - 34.0 pg   MCHC 33.7 30.0 - 36.0 g/dL   RDW 12.9 11.5 - 15.5 %   Platelets 199 150 - 400 K/uL   nRBC 0.0  0.0 - 0.2 %  Troponin I (High Sensitivity)     Status: Abnormal   Collection Time: 06/25/19  8:08 PM  Result Value Ref Range   Troponin I (High Sensitivity) 88 (H) <18 ng/L  Comprehensive metabolic panel     Status: Abnormal   Collection Time: 06/25/19  8:08 PM  Result Value Ref Range   Sodium 141 135 - 145 mmol/L   Potassium 4.3 3.5 - 5.1 mmol/L   Chloride 106 98 - 111 mmol/L   CO2 24 22 - 32 mmol/L   Glucose, Bld 109 (H) 70 - 99 mg/dL   BUN 28 (H) 8 - 23 mg/dL   Creatinine, Ser 2.04 (H) 0.61 - 1.24 mg/dL   Calcium 9.5 8.9 - 10.3 mg/dL   Total Protein 6.3 (L) 6.5 - 8.1 g/dL   Albumin 3.5 3.5 - 5.0 g/dL   AST 25 15 - 41 U/L   ALT 29 0 - 44 U/L   Alkaline Phosphatase 92 38 - 126 U/L   Total Bilirubin 0.4 0.3 - 1.2 mg/dL   GFR calc non Af Amer 29 (L) >60 mL/min   GFR calc Af Amer 33 (L) >60 mL/min   Anion gap 11 5 - 15  D-dimer, quantitative (not at [facility])     Status: Abnormal   Collection Time: 06/25/19  8:15 PM  Result Value Ref Range   D-Dimer, Quant 0.82 (H) 0.00 - 0.50 ug/mL-FEU  Magnesium     Status: None   Collection Time: 06/25/19  8:15 PM  Result Value Ref Range   Magnesium 1.9 1.7 - 2.4 mg/dL  Phosphorus     Status: None   Collection Time: 06/25/19  8:15 PM  Result Value Ref Range   Phosphorus 3.4 2.5 - 4.6 mg/dL  Brain natriuretic peptide     Status: Abnormal   Collection Time: 06/25/19  8:18 PM  Result Value Ref Range   B Natriuretic Peptide 1,148.7 (H) 0.0 - 100.0 pg/mL  SARS Coronavirus 2 [facility] order, Performed in [facility] hospital lab) Nasopharyngeal Nasopharyngeal Swab     Status: None   Collection Time: 06/25/19  9:37 PM   Specimen: Nasopharyngeal Swab  Result Value Ref Range   SARS Coronavirus 2 NEGATIVE NEGATIVE  Troponin I (High Sensitivity)     Status: Abnormal  Collection Time: 06/25/19 10:28 PM  Result Value Ref Range   Troponin I (High Sensitivity) 90 (H) <18 ng/L   Dg Chest 2 View  Result Date: 06/25/2019 CLINICAL DATA:  Dyspnea.   Chest heaviness. EXAM: CHEST - 2 VIEW COMPARISON:  05/09/2019 chest radiograph. FINDINGS: Stable cardiomediastinal silhouette with borderline mild cardiomegaly. No pneumothorax. No pleural effusion. Lungs appear clear, with no acute consolidative airspace disease and no pulmonary edema. IMPRESSION: Borderline mild cardiomegaly, without pulmonary edema. No active pulmonary disease. Electronically Signed   By: Ilona Sorrel M.D.   On: 06/25/2019 18:34   Ct Angio Chest Pe W And/or Wo Contrast  Result Date: 06/25/2019 CLINICAL DATA:  Shortness of breath he had EXAM: CT ANGIOGRAPHY CHEST WITH CONTRAST TECHNIQUE: Multidetector CT imaging of the chest was performed using the standard protocol during bolus administration of intravenous contrast. Multiplanar CT image reconstructions and MIPs were obtained to evaluate the vascular anatomy. CONTRAST:  63mL OMNIPAQUE IOHEXOL 350 MG/ML SOLN COMPARISON:  None. FINDINGS: Cardiovascular: There is a optimal opacification of the pulmonary arteries. There is no central,segmental, or subsegmental filling defects within the pulmonary arteries. There is mild cardiomegaly. Left ventricular enlargement is. There is normal three-vessel brachiocephalic anatomy without proximal stenosis. Scattered atherosclerosis is noted. Mediastinum/Nodes: No hilar, mediastinal, or axillary adenopathy. Thyroid gland, trachea, and esophagus demonstrate no significant findings. Lungs/Pleura: Minimal streaky atelectasis seen at the bilateral lower lungs. No pleural effusion or pneumothorax. No airspace consolidation. Upper Abdomen: No acute abnormalities present in the visualized portions of the upper abdomen. Musculoskeletal: No chest wall abnormality. No acute or significant osseous findings. Review of the MIP images confirms the above findings. IMPRESSION: 1. No central, segmental, or subsegmental pulmonary embolism. 2. Mild cardiomegaly.  With left ventricular enlargement 3. Minimal bibasilar streaky  atelectasis. Electronically Signed   By: Prudencio Pair M.D.   On: 06/25/2019 22:36    ASSESSMENT: In summary, DORRIEN GRUNDER is a 85 y.o. male w/ history of HFrEF, CKD3, HLD, gout, HTN, and LBBB presents with tachycardia.   Patient's ECG highly suggestive of atypical atrial flutter with evidence of variable block on subsequent ECG's.  Given the patient's known diagnosis of heart failure with reduced ejection fraction, this would very reasonably explain his symptoms of shortness of breath and exertional fatigue.  He does not have any known history of atrial fibrillation or flutter and currently takes no anticoagulants.  His CHADS2-VASc score is at least 4, thus anticoagulation is indicated.   PLAN/DISCUSSION: #) Atrial arrhtyhmia: likely atypical flutter - NPO tonight for possible TEE/DCCV in AM - heparin drip for A-fib - likely transition to dose reduced apixaban (renal function, age) - start amiodarone drip for rate control - cont home coreg - echo once better rate controlled (or once back in sinus rhythm)  #) Elevated troponin: most likely in setting of mild volume overload and tachycardia; will trend - repeat troponin in 6 hours - heparin for AF as per above - cont home atorvastatin  #) HFrEF: minimally volume overloaded on exam, no evidence of low output state - lasix IV x 1 in AM - cont home coreg, entresto, spironolactone  #) CKD: creatinine now 2.0 from 1.6 a few months ago. Possibly due to worsening cardiac output from new AFL. - monitor  Marcie Mowers, MD Cardiology Fellow, PGY-7

## 2019-06-25 NOTE — Telephone Encounter (Signed)
Patient's daughter called concerned about her dad and his symptoms. Marijean Heath got her dad on the phone and he stated that he started about a week ago with SOB, diarrhea and chest congestion. Patient stated that the diarrhea stopped two days ago. Patient stated that he does have difficulty breathing when moving around fast. Patient stated that he works at [employer] and knows some of the employees have had covid. Advised patient unfortunately with his symptoms we can not bring him into the office, but he does need to be evaluated.  Patient stated that he will go to [facility] now to be evaluated.

## 2019-06-26 DIAGNOSIS — Z800 Family history of malignant neoplasm of digestive organs: Secondary | ICD-10-CM | POA: Diagnosis not present

## 2019-06-26 DIAGNOSIS — N183 Chronic kidney disease, stage 3 unspecified: Secondary | ICD-10-CM | POA: Diagnosis present

## 2019-06-26 DIAGNOSIS — Z7982 Long term (current) use of aspirin: Secondary | ICD-10-CM | POA: Diagnosis not present

## 2019-06-26 DIAGNOSIS — Z79899 Other long term (current) drug therapy: Secondary | ICD-10-CM | POA: Diagnosis not present

## 2019-06-26 DIAGNOSIS — Z8719 Personal history of other diseases of the digestive system: Secondary | ICD-10-CM | POA: Diagnosis not present

## 2019-06-26 DIAGNOSIS — I447 Left bundle-branch block, unspecified: Secondary | ICD-10-CM | POA: Diagnosis present

## 2019-06-26 DIAGNOSIS — I509 Heart failure, unspecified: Secondary | ICD-10-CM | POA: Diagnosis not present

## 2019-06-26 DIAGNOSIS — I4891 Unspecified atrial fibrillation: Secondary | ICD-10-CM | POA: Diagnosis present

## 2019-06-26 DIAGNOSIS — I4892 Unspecified atrial flutter: Secondary | ICD-10-CM | POA: Diagnosis present

## 2019-06-26 DIAGNOSIS — I471 Supraventricular tachycardia: Secondary | ICD-10-CM | POA: Diagnosis present

## 2019-06-26 DIAGNOSIS — Z20828 Contact with and (suspected) exposure to other viral communicable diseases: Secondary | ICD-10-CM | POA: Diagnosis present

## 2019-06-26 DIAGNOSIS — I5020 Unspecified systolic (congestive) heart failure: Secondary | ICD-10-CM | POA: Diagnosis present

## 2019-06-26 DIAGNOSIS — N400 Enlarged prostate without lower urinary tract symptoms: Secondary | ICD-10-CM | POA: Diagnosis present

## 2019-06-26 DIAGNOSIS — M109 Gout, unspecified: Secondary | ICD-10-CM | POA: Diagnosis present

## 2019-06-26 DIAGNOSIS — E785 Hyperlipidemia, unspecified: Secondary | ICD-10-CM | POA: Diagnosis present

## 2019-06-26 DIAGNOSIS — I130 Hypertensive heart and chronic kidney disease with heart failure and stage 1 through stage 4 chronic kidney disease, or unspecified chronic kidney disease: Secondary | ICD-10-CM | POA: Diagnosis present

## 2019-06-26 DIAGNOSIS — I428 Other cardiomyopathies: Secondary | ICD-10-CM | POA: Diagnosis present

## 2019-06-26 DIAGNOSIS — Z8673 Personal history of transient ischemic attack (TIA), and cerebral infarction without residual deficits: Secondary | ICD-10-CM | POA: Diagnosis not present

## 2019-06-26 DIAGNOSIS — Z79891 Long term (current) use of opiate analgesic: Secondary | ICD-10-CM | POA: Diagnosis not present

## 2019-06-26 DIAGNOSIS — N179 Acute kidney failure, unspecified: Secondary | ICD-10-CM | POA: Diagnosis present

## 2019-06-26 LAB — CBC
HCT: 34.8 % — ABNORMAL LOW (ref 39.0–52.0)
HCT: 36.4 % — ABNORMAL LOW (ref 39.0–52.0)
Hemoglobin: 11 g/dL — ABNORMAL LOW (ref 13.0–17.0)
Hemoglobin: 11.8 g/dL — ABNORMAL LOW (ref 13.0–17.0)
MCH: 28.2 pg (ref 26.0–34.0)
MCH: 28.5 pg (ref 26.0–34.0)
MCHC: 31.6 g/dL (ref 30.0–36.0)
MCHC: 32.4 g/dL (ref 30.0–36.0)
MCV: 89.2 fL (ref 80.0–100.0)
MCV: 87.9 fL (ref 80.0–100.0)
Platelets: 187 10*3/uL (ref 150–400)
Platelets: 196 10*3/uL (ref 150–400)
RBC: 3.9 MIL/uL — ABNORMAL LOW (ref 4.22–5.81)
RBC: 4.14 MIL/uL — ABNORMAL LOW (ref 4.22–5.81)
RDW: 13.2 % (ref 11.5–15.5)
RDW: 13.2 % (ref 11.5–15.5)
WBC: 4.7 10*3/uL (ref 4.0–10.5)
WBC: 4.9 10*3/uL (ref 4.0–10.5)
nRBC: 0 % (ref 0.0–0.2)
nRBC: 0 % (ref 0.0–0.2)

## 2019-06-26 LAB — BASIC METABOLIC PANEL
Anion gap: 11 (ref 5–15)
BUN: 27 mg/dL — ABNORMAL HIGH (ref 8–23)
CO2: 21 mmol/L — ABNORMAL LOW (ref 22–32)
Calcium: 9 mg/dL (ref 8.9–10.3)
Chloride: 110 mmol/L (ref 98–111)
Creatinine, Ser: 1.94 mg/dL — ABNORMAL HIGH (ref 0.61–1.24)
GFR calc Af Amer: 36 mL/min — ABNORMAL LOW (ref >60)
GFR calc non Af Amer: 31 mL/min — ABNORMAL LOW (ref >60)
Glucose, Bld: 116 mg/dL — ABNORMAL HIGH (ref 70–99)
Potassium: 4.5 mmol/L (ref 3.5–5.1)
Sodium: 142 mmol/L (ref 135–145)

## 2019-06-26 LAB — HEPARIN LEVEL (UNFRACTIONATED)
Heparin Unfractionated: 0.66 IU/mL (ref 0.30–0.70)
Heparin Unfractionated: 0.59 IU/mL (ref 0.30–0.70)

## 2019-06-26 LAB — TSH: TSH: 4.437 u[IU]/mL (ref 0.350–4.500)

## 2019-06-26 MED ORDER — ASPIRIN EC 81 MG PO TBEC
81.0000 mg | DELAYED_RELEASE_TABLET | Freq: Every day | ORAL | Status: DC
  Administered 2019-06-26 – 2019-06-27 (×2): 81 mg via ORAL
  Filled 2019-06-26 (×2): qty 1

## 2019-06-26 MED ORDER — AMIODARONE HCL IN DEXTROSE 360-4.14 MG/200ML-% IV SOLN
30.0000 mg/h | INTRAVENOUS | Status: AC
  Administered 2019-06-26 – 2019-06-28 (×5): 30 mg/h via INTRAVENOUS
  Filled 2019-06-26 (×5): qty 200

## 2019-06-26 MED ORDER — HEPARIN (PORCINE) 25000 UT/250ML-% IV SOLN
1050.0000 [IU]/h | INTRAVENOUS | Status: DC
  Administered 2019-06-26 (×2): 1100 [IU]/h via INTRAVENOUS
  Administered 2019-06-28: 1050 [IU]/h via INTRAVENOUS
  Filled 2019-06-26 (×3): qty 250

## 2019-06-26 MED ORDER — AMIODARONE LOAD VIA INFUSION
150.0000 mg | Freq: Once | INTRAVENOUS | Status: AC
  Administered 2019-06-26: 150 mg via INTRAVENOUS
  Filled 2019-06-26: qty 83.34

## 2019-06-26 MED ORDER — ONDANSETRON HCL 4 MG/2ML IJ SOLN: 4.0000 mg | Freq: Four times a day (QID) | INTRAMUSCULAR | Status: DC | PRN

## 2019-06-26 MED ORDER — ACETAMINOPHEN 500 MG PO TABS: 500.0000 mg | ORAL_TABLET | Freq: Three times a day (TID) | ORAL | Status: DC | PRN

## 2019-06-26 MED ORDER — TRAMADOL HCL 50 MG PO TABS: 50.0000 mg | ORAL_TABLET | Freq: Two times a day (BID) | ORAL | Status: DC | PRN

## 2019-06-26 MED ORDER — SPIRONOLACTONE 12.5 MG HALF TABLET
12.5000 mg | ORAL_TABLET | Freq: Every day | ORAL | Status: DC
  Administered 2019-06-26 – 2019-06-28 (×3): 12.5 mg via ORAL
  Filled 2019-06-26 (×4): qty 1

## 2019-06-26 MED ORDER — ATORVASTATIN CALCIUM 80 MG PO TABS
80.0000 mg | ORAL_TABLET | Freq: Every day | ORAL | Status: DC
  Administered 2019-06-26 – 2019-06-27 (×2): 80 mg via ORAL
  Filled 2019-06-26 (×2): qty 1

## 2019-06-26 MED ORDER — AMIODARONE HCL IN DEXTROSE 360-4.14 MG/200ML-% IV SOLN
60.0000 mg/h | INTRAVENOUS | Status: AC
  Administered 2019-06-26: 60 mg/h via INTRAVENOUS
  Filled 2019-06-26: qty 200

## 2019-06-26 MED ORDER — CARVEDILOL 6.25 MG PO TABS
6.2500 mg | ORAL_TABLET | Freq: Two times a day (BID) | ORAL | Status: DC
  Administered 2019-06-26 – 2019-06-27 (×4): 6.25 mg via ORAL
  Filled 2019-06-26 (×4): qty 1

## 2019-06-26 MED ORDER — HEPARIN BOLUS VIA INFUSION
3900.0000 [IU] | Freq: Once | INTRAVENOUS | Status: AC
  Administered 2019-06-26: 3900 [IU] via INTRAVENOUS
  Filled 2019-06-26: qty 3900

## 2019-06-26 MED ORDER — AMLODIPINE BESYLATE 5 MG PO TABS
5.0000 mg | ORAL_TABLET | Freq: Every day | ORAL | Status: DC
  Administered 2019-06-26 – 2019-06-28 (×3): 5 mg via ORAL
  Filled 2019-06-26 (×2): qty 1
  Filled 2019-06-26: qty 2

## 2019-06-26 MED ORDER — SACUBITRIL-VALSARTAN 97-103 MG PO TABS
1.0000 | ORAL_TABLET | Freq: Two times a day (BID) | ORAL | Status: DC
  Administered 2019-06-26 – 2019-06-28 (×5): 1 via ORAL
  Filled 2019-06-26 (×6): qty 1

## 2019-06-26 NOTE — ED Notes (Signed)
Family at bedside. 

## 2019-06-26 NOTE — Progress Notes (Signed)
ANTICOAGULATION CONSULT NOTE - Initial Consult  Pharmacy Consult for heparin Indication: atrial fibrillation  Allergies  Allergen Reactions  . Lisinopril Other (See Comments) and Cough    Possible history of cough on med.   . Nsaids Other (See Comments)    Avoid due to renal status  . Penicillins Swelling and Rash    Has patient had a PCN reaction causing immediate rash, facial/tongue/throat swelling, SOB or lightheadedness with hypotension: Yes- chest swells Has patient had a PCN reaction causing severe rash involving mucus membranes or skin necrosis: No Has patient had a PCN reaction that required hospitalization: No Has patient had a PCN reaction occurring within the last 10 years: No If all of the above answers are "NO", then may proceed with Cephalosporin use.     Patient Measurements: Height: 5\' 7"  (170.2 cm) Weight: 172 lb (78 kg) IBW/kg (Calculated) : 66.1 Heparin Dosing Weight: 78 kg  Vital Signs: Temp: 98.8 F (37.1 C) (10/02 2000) Temp Source: Oral (10/02 1653) BP: 110/79 (10/03 0033) Pulse Rate: 153 (10/03 0033)  Labs: Recent Labs    06/25/19 2008 06/25/19 2228  HGB 12.1*  --   HCT 35.9*  --   PLT 199  --   CREATININE 2.04*  --   TROPONINIHS 88* 90*    Estimated Creatinine Clearance: 24.8 mL/min (A) (by C-G formula based on SCr of 2.04 mg/dL (H)).   Medical History: Past Medical History:  Diagnosis Date  . Arthritis   . BPH (benign prostatic hyperplasia)   . CHF (congestive heart failure) ([place])    Nonischemic Cardiomyopathy. EF 25-35%  . Chronic kidney disease 05/20/2013   CKD 3  . Colon polyps   . Dyslipidemia   . Dysrhythmia 08/19/11   "low heart beat; maybe 25bpm; got RX; now 60bpm"  . ED (erectile dysfunction)   . Gout   . Heart murmur   . Hyperlipidemia   . Hypertension   . Low back pain 08/12/2013  . Nonfunctioning kidney Recognized 03/2006   Chronically and Severely Hydronephrotic and Nonfunctioning Right Kidney  . Solitary kidney  Recognized 03/2006   Solitary Functioning Left Kidney  . Stroke [facility])    "mild stroke; dx'd 2010; don't know when it happened"  . Syncope and collapse 08/19/11   "shallow breathing; sweating horribly"    Medications:  Scheduled:  . amiodarone  150 mg Intravenous Once    Assessment: 8 yom presenting with CP and SOB - found to be in atypial flutter. No AC PTA.  Hgb 12.1, plt 199. D-dimer 0.82, trop 90. No s/sx of bleeding.   Goal of Therapy:  Heparin level 0.3-0.7 units/ml Monitor platelets by anticoagulation protocol: Yes   Plan:  Give 3900 units bolus x 1 Start heparin infusion at 1100 units/hr Check anti-Xa level in 8 hours and daily while on heparin Continue to monitor H&H and platelets  Antonietta Jewel, PharmD, BCCCP Clinical Pharmacist   Please check AMION for all [place] phone numbers After 10:00 PM, call [place] [phone] 06/26/2019,1:00 AM

## 2019-06-26 NOTE — ED Notes (Signed)
Lunch Tray Ordered @ 1056. 

## 2019-06-26 NOTE — Consult Note (Signed)
Cardiology Consultation:   Patient ID: John Hines MRN: [id number]; DOB: 10/21/1931  Admit date: 06/25/2019 Date of Consult: 06/26/2019  Primary Care Provider: Tonia Ghent, MD Primary Cardiologist: Shelva Majestic, MD  Primary Electrophysiologist:  Curt Bears   Patient Profile:   John Hines is a 85 y.o. male with a hx of chronic systolic heart failure, CKD stage III, hyperlipidemia, gout, hypertension, left bundle branch block who is being seen today for the evaluation of atrial flutter at the request of Lanna Poche.  History of Present Illness:   John Hines has been having 2 weeks of symptoms of fatigue and shortness of breath.  He has a known diagnosis of heart failure with reduced ejection fraction.  He typically does not get short of breath and does not have symptoms of fluid retention.  Over the last 2 or 3 weeks, this is significantly changed.  He has not been able to do his normal activities such as mowing the lawn.  He has not missed doses of his medications.  He went to urgent care to be COVID tested.  He was found to be in atrial flutter.  Heart Pathway Score:     Past Medical History:  Diagnosis Date   Arthritis    BPH (benign prostatic hyperplasia)    CHF (congestive heart failure) ([place])    Nonischemic Cardiomyopathy. EF 25-35%   Chronic kidney disease 05/20/2013   CKD 3   Colon polyps    Dyslipidemia    Dysrhythmia 08/19/11   "low heart beat; maybe 25bpm; got RX; now 60bpm"   ED (erectile dysfunction)    Gout    Heart murmur    Hyperlipidemia    Hypertension    Low back pain 08/12/2013   Nonfunctioning kidney Recognized 03/2006   Chronically and Severely Hydronephrotic and Nonfunctioning Right Kidney   Solitary kidney Recognized 03/2006   Solitary Functioning Left Kidney   Stroke [facility])    "mild stroke; dx'd 2010; don't know when it happened"   Syncope and collapse 08/19/11   "shallow breathing; sweating horribly"    Past  Surgical History:  Procedure Laterality Date   CARDIOVASCULAR STRESS TEST  08/30/2010   Small to moderate fixed inferoseptal defect. Severe global hypokinesis with inferior akinesis and septal dyskinesis-likely secondary to a prominent LBBB. Non-diagnostic for ischemia due to persantine.   KNEE ARTHROSCOPY  1996   right   TRANSTHORACIC ECHOCARDIOGRAM  08/20/2011   EF 45-50%, severe concentric hyperthrophy.     Home Medications:  Prior to Admission medications   Medication Sig Start Date End Date Taking? Authorizing Provider  acetaminophen (TYLENOL) 500 MG tablet Take 500-1,000 mg by mouth every 8 (eight) hours as needed for mild pain or headache.    Yes [provider]  amLODipine (NORVASC) 5 MG tablet TAKE 1 TABLET BY MOUTH EVERY DAY Patient taking differently: Take 5 mg by mouth daily.  03/16/19  Yes Troy Sine, MD  aspirin EC 81 MG tablet Take 81 mg by mouth at bedtime.    Yes [provider]  atorvastatin (LIPITOR) 80 MG tablet TAKE 1 TABLET BY MOUTH EVERY DAY Patient taking differently: Take 80 mg by mouth at bedtime.  05/04/19  Yes Troy Sine, MD  carvedilol (COREG) 6.25 MG tablet Take 1 tablet (6.25 mg total) by mouth 2 (two) times daily with a meal. 01/08/19  Yes Troy Sine, MD  ELDERBERRY PO Take 1 tablet by mouth at bedtime.   Yes [provider]  Delene Loll  97-103 MG TAKE 1 TABLET BY MOUTH TWICE A DAY Patient taking differently: Take 1 tablet by mouth 2 (two) times daily.  12/21/18  Yes Troy Sine, MD  ferrous sulfate 325 (65 FE) MG tablet TAKE 1 TABLET (325 MG TOTAL) BY MOUTH DAILY. Patient taking differently: Take 325 mg by mouth at bedtime.  11/27/18  Yes Tonia Ghent, MD  Multiple Vitamins-Minerals (ONE-A-DAY VITACRAVES IMMUNITY) CHEW Chew 2 tablets by mouth 2 (two) times a week.    Yes [provider]  Omega-3 Fatty Acids (FISH OIL) 1000 MG CAPS Take 1,000 mg by mouth 3 (three) times a week.    Yes [provider]  spironolactone (ALDACTONE) 25 MG tablet Take 0.5 tablets (12.5 mg total) by mouth daily. 01/08/19 06/25/19 Yes Troy Sine, MD  benzonatate (TESSALON) 200 MG capsule Take 1 capsule (200 mg total) by mouth 3 (three) times daily as needed. Patient not taking: Reported on 06/25/2019 01/10/19   Tonia Ghent, MD  sildenafil (VIAGRA) 100 MG tablet Take 1 tablet (100 mg total) by mouth daily as needed for erectile dysfunction. 01/10/19   Tonia Ghent, MD  traMADol (ULTRAM) 50 MG tablet Take 1 tablet (50 mg total) by mouth every 12 (twelve) hours as needed. Patient not taking: Reported on 06/25/2019 02/25/19   Tonia Ghent, MD    Inpatient Medications: Scheduled Meds:  amLODipine  5 mg Oral Daily   aspirin EC  81 mg Oral QHS   atorvastatin  80 mg Oral QHS   carvedilol  6.25 mg Oral BID WC   sacubitril-valsartan  1 tablet Oral BID   spironolactone  12.5 mg Oral Daily   Continuous Infusions:  amiodarone 30 mg/hr (06/26/19 4193)   heparin 1,100 Units/hr (06/26/19 0237)   PRN Meds: acetaminophen, ondansetron (ZOFRAN) IV, traMADol  Allergies:    Allergies  Allergen Reactions   Lisinopril Other (See Comments) and Cough    Possible history of cough on med.    Nsaids Other (See Comments)    Avoid due to renal status   Penicillins Swelling and Rash    Has patient had a PCN reaction causing immediate rash, facial/tongue/throat swelling, SOB or lightheadedness with hypotension: Yes- chest swells Has patient had a PCN reaction causing severe rash involving mucus membranes or skin necrosis: No Has patient had a PCN reaction that required hospitalization: No Has patient had a PCN reaction occurring within the last 10 years: No If all of the above answers are "NO", then may proceed with Cephalosporin use.     Social History:   Social History   Socioeconomic History   Marital status: Widowed    Spouse name: Not on file   Number of children: Not on file   Years of  education: Not on file   Highest education level: Not on file  Occupational History   Not on file  Social Needs   Financial resource strain: Not on file   Food insecurity    Worry: Not on file    Inability: Not on file   Transportation needs    Medical: Not on file    Non-medical: Not on file  Tobacco Use   Smoking status: Never Smoker   Smokeless tobacco: Never Used  Substance and Sexual Activity   Alcohol use: Yes   Drug use: No   Sexual activity: Yes  Lifestyle   Physical activity    Days per week: Not on file    Minutes per session: Not on  file   Stress: Not on file  Relationships   Social connections    Talks on phone: Not on file    Gets together: Not on file    Attends religious service: Not on file    Active member of club or organization: Not on file    Attends meetings of clubs or organizations: Not on file    Relationship status: Not on file   Intimate partner violence    Fear of current or ex partner: Not on file    Emotionally abused: Not on file    Physically abused: Not on file    Forced sexual activity: Not on file  Other Topics Concern   Not on file  Social History Narrative   Grew up in [place].  His mother died in childbirth when he was an infant.   Widowed 2019.  Was previously married for 76 years.   He works Corporate treasurer for [employer].    Family History:    Family History  Problem Relation Age of Onset   Early death Mother        died in child birth   Stomach cancer Father        stomach     ROS:  Please see the history of present illness.   All other ROS reviewed and negative.     Physical Exam/Data:   Vitals:   06/26/19 0600 06/26/19 0630 06/26/19 0700 06/26/19 0800  BP: 92/80 117/72 120/89 108/87  Pulse: (!) 143  80 (!) 144  Resp: 18 19 (!) 22 (!) 22  Temp:      SpO2: 100%  98% 98%  Weight:      Height:        Intake/Output Summary (Last 24 hours) at 06/26/2019 0856 Last data  filed at 06/26/2019 2947 Gross per 24 hour  Intake 700 ml  Output --  Net 700 ml   Last 3 Weights 06/25/2019 05/09/2019 02/16/2019  Weight (lbs) 172 lb 172 lb 168 lb 2 oz  Weight (kg) 78.019 kg 78.019 kg 76.261 kg     Body mass index is 26.94 kg/m.  General:  Well nourished, well developed, in no acute distress HEENT: normal Lymph: no adenopathy Neck: no JVD Endocrine:  No thryomegaly Vascular: No carotid bruits; FA pulses 2+ bilaterally without bruits  Cardiac: Tachycardic, no murmurs Lungs:  clear to auscultation bilaterally, no wheezing, rhonchi or rales  Abd: soft, nontender, no hepatomegaly  Ext: no edema Musculoskeletal:  No deformities, BUE and BLE strength normal and equal Skin: warm and dry  Neuro:  CNs 2-12 intact, no focal abnormalities noted Psych:  Normal affect   EKG:  The EKG was personally reviewed and demonstrates: SVT, right bundle branch block Telemetry:  Telemetry was personally reviewed and demonstrates: SVT, right bundle branch block  Relevant CV Studies: TTE 08/31/2018 - Left ventricle: The cavity size was normal. There was severe   focal basal hypertrophy of the septum. Systolic function was   moderately to severely reduced. The estimated ejection fraction   was in the range of 30% to 35%. Diffuse hypokinesis. Doppler   parameters are consistent with abnormal left ventricular   relaxation (grade 1 diastolic dysfunction). - Aortic valve: Trileaflet; mildly thickened, mildly calcified   leaflets. There was trivial regurgitation. - Left atrium: The atrium was mildly dilated.  Laboratory Data:  High Sensitivity Troponin:   Recent Labs  Lab 06/25/19 2008 06/25/19 2228  TROPONINIHS 88* 90*     Chemistry  Recent Labs  Lab 06/25/19 2008  NA 141  K 4.3  CL 106  CO2 24  GLUCOSE 109*  BUN 28*  CREATININE 2.04*  CALCIUM 9.5  GFRNONAA 29*  GFRAA 33*  ANIONGAP 11    Recent Labs  Lab 06/25/19 2008  PROT 6.3*  ALBUMIN 3.5  AST 25  ALT 29   ALKPHOS 92  BILITOT 0.4   Hematology Recent Labs  Lab 06/25/19 2008 06/26/19 0613  WBC 4.8 4.7  RBC 4.08* 3.90*  HGB 12.1* 11.0*  HCT 35.9* 34.8*  MCV 88.0 89.2  MCH 29.7 28.2  MCHC 33.7 31.6  RDW 12.9 13.2  PLT 199 187   BNP Recent Labs  Lab 06/25/19 2018  BNP 1,148.7*    DDimer  Recent Labs  Lab 06/25/19 2015  DDIMER 0.82*     Radiology/Studies:  Dg Chest 2 View  Result Date: 06/25/2019 CLINICAL DATA:  Dyspnea.  Chest heaviness. EXAM: CHEST - 2 VIEW COMPARISON:  05/09/2019 chest radiograph. FINDINGS: Stable cardiomediastinal silhouette with borderline mild cardiomegaly. No pneumothorax. No pleural effusion. Lungs appear clear, with no acute consolidative airspace disease and no pulmonary edema. IMPRESSION: Borderline mild cardiomegaly, without pulmonary edema. No active pulmonary disease. Electronically Signed   By: Ilona Sorrel M.D.   On: 06/25/2019 18:34   Ct Angio Chest Pe W And/or Wo Contrast  Result Date: 06/25/2019 CLINICAL DATA:  Shortness of breath he had EXAM: CT ANGIOGRAPHY CHEST WITH CONTRAST TECHNIQUE: Multidetector CT imaging of the chest was performed using the standard protocol during bolus administration of intravenous contrast. Multiplanar CT image reconstructions and MIPs were obtained to evaluate the vascular anatomy. CONTRAST:  9mL OMNIPAQUE IOHEXOL 350 MG/ML SOLN COMPARISON:  None. FINDINGS: Cardiovascular: There is a optimal opacification of the pulmonary arteries. There is no central,segmental, or subsegmental filling defects within the pulmonary arteries. There is mild cardiomegaly. Left ventricular enlargement is. There is normal three-vessel brachiocephalic anatomy without proximal stenosis. Scattered atherosclerosis is noted. Mediastinum/Nodes: No hilar, mediastinal, or axillary adenopathy. Thyroid gland, trachea, and esophagus demonstrate no significant findings. Lungs/Pleura: Minimal streaky atelectasis seen at the bilateral lower lungs. No  pleural effusion or pneumothorax. No airspace consolidation. Upper Abdomen: No acute abnormalities present in the visualized portions of the upper abdomen. Musculoskeletal: No chest wall abnormality. No acute or significant osseous findings. Review of the MIP images confirms the above findings. IMPRESSION: 1. No central, segmental, or subsegmental pulmonary embolism. 2. Mild cardiomegaly.  With left ventricular enlargement 3. Minimal bibasilar streaky atelectasis. Electronically Signed   By: Prudencio Pair M.D.   On: 06/25/2019 22:36    Assessment and Plan:   1. SVT: Currently on a heparin drip and amiodarone.  Have continued his home rate control.  At this point, I do not feel that this is due to an atrial flutter.  There are reports in the chart that he was given adenosine without evidence of flutter waves during the ventricular pause.  This is likely due to potentially an atrial tachycardia.  Is also leaning against SVT as his rate on presentation was quite fast and with heart failure I do not feel that he would be able to conduct that quickly around his atrium.  We Sariah Henkin continue his amiodarone.  He may require cardioversion on Monday. 2. Heart failure with reduced ejection fraction: Minimal signs of volume overload.  Continue Coreg, Entresto, Aldactone. 3. Acute on chronic renal failure: Baseline creatinine appears to be 1.6 is now up to 2.04.  Potentially due to his atrial  arrhythmias.  We Lequita Meadowcroft recheck once in sinus rhythm.   For questions or updates, please contact [place] Please consult [email or web address] for contact info under     Signed, Oryan Winterton Meredith Leeds, MD  06/26/2019 8:56 AM

## 2019-06-26 NOTE — Progress Notes (Signed)
ANTICOAGULATION CONSULT NOTE  Pharmacy Consult for heparin Indication: atrial fibrillation   Patient Measurements: Height: 5\' 7"  (170.2 cm) Weight: 172 lb (78 kg) IBW/kg (Calculated) : 66.1 Heparin Dosing Weight: 78 kg  Vital Signs: Temp: 98.2 F (36.8 C) (10/03 1318) Temp Source: Oral (10/03 1318) BP: 127/76 (10/03 1318) Pulse Rate: 80 (10/03 1318)  Labs: Recent Labs    06/25/19 2008 06/25/19 2228 06/26/19 0613 06/26/19 0951 06/26/19 1811  HGB 12.1*  --  11.0* 11.8*  --   HCT 35.9*  --  34.8* 36.4*  --   PLT 199  --  187 196  --   HEPARINUNFRC  --   --   --  0.66 0.59  CREATININE 2.04*  --   --  1.94*  --   TROPONINIHS 88* 90*  --   --   --      Medical History: Past Medical History:  Diagnosis Date  . Arthritis   . BPH (benign prostatic hyperplasia)   . CHF (congestive heart failure) ([place])    Nonischemic Cardiomyopathy. EF 25-35%  . Chronic kidney disease 05/20/2013   CKD 3  . Colon polyps   . Dyslipidemia   . Dysrhythmia 08/19/11   "low heart beat; maybe 25bpm; got RX; now 60bpm"  . ED (erectile dysfunction)   . Gout   . Heart murmur   . Hyperlipidemia   . Hypertension   . Low back pain 08/12/2013  . Nonfunctioning kidney Recognized 03/2006   Chronically and Severely Hydronephrotic and Nonfunctioning Right Kidney  . Solitary kidney Recognized 03/2006   Solitary Functioning Left Kidney  . Stroke [facility])    "mild stroke; dx'd 2010; don't know when it happened"  . Syncope and collapse 08/19/11   "shallow breathing; sweating horribly"    Medications:  Scheduled:  . amLODipine  5 mg Oral Daily  . aspirin EC  81 mg Oral QHS  . atorvastatin  80 mg Oral QHS  . carvedilol  6.25 mg Oral BID WC  . sacubitril-valsartan  1 tablet Oral BID  . spironolactone  12.5 mg Oral Daily    Assessment: 23 yom presenting with CP and SOB - found to be in atypial flutter. No anticoagulation prior to admission. Heparin originally started for what was thought to be  Aflutter. Patient was then given Adenosine for potential SVT - this did not slow the HR. He remains on heparin while he is being worked up.   Heparin level remains therapeutic at 0.59  Goal of Therapy:  Heparin level 0.3-0.7 units/ml Monitor platelets by anticoagulation protocol: Yes    Plan:  Continue heparin gtt at 1100 units/hr Daily heparin level, CBC, s/s bleeding  Bertis Ruddy, PharmD Clinical Pharmacist Please check AMION for all [place] numbers 06/26/2019 7:04 PM

## 2019-06-26 NOTE — ED Notes (Addendum)
ED TO INPATIENT HANDOFF REPORT  ED Nurse Name and Phone #: Thurmond Butts [place] Name/Age/Gender John Hines 85 y.o. male Room/Bed: 008C/008C  Code Status   Code Status: Full Code  Home/SNF/Other Home Patient oriented to: self, place, time and situation Is this baseline? Yes   Triage Complete: Triage complete  Chief Complaint high heart rate  Triage Note The pt has been having chest pain since yesterday  Today he came in to ucc for the same  His heart rate is 160  On arrival  Sob yesterday n   Allergies Allergies  Allergen Reactions  . Lisinopril Other (See Comments) and Cough    Possible history of cough on med.   . Nsaids Other (See Comments)    Avoid due to renal status  . Penicillins Swelling and Rash    Has patient had a PCN reaction causing immediate rash, facial/tongue/throat swelling, SOB or lightheadedness with hypotension: Yes- chest swells Has patient had a PCN reaction causing severe rash involving mucus membranes or skin necrosis: No Has patient had a PCN reaction that required hospitalization: No Has patient had a PCN reaction occurring within the last 10 years: No If all of the above answers are "NO", then may proceed with Cephalosporin use.     Level of Care/Admitting Diagnosis ED Disposition    ED Disposition Condition [facility]: [place] [[id number]]  Level of Care: Telemetry Cardiac [[id number]]  Covid Evaluation: Asymptomatic Screening Protocol (No Symptoms)  Diagnosis: Atrial flutter ([place]) [427.32.ICD-9-CM]  Admitting Physician: Marcie Mowers [[id number]]  Attending Physician: Satira Sark [[id number]]  Estimated length of stay: past midnight tomorrow  Certification:: I certify this patient will need inpatient services for at least 2 midnights  PT Class (Do Not Modify): Inpatient [101]  PT Acc Code (Do Not Modify): Private [1]       B Medical/Surgery History Past Medical History:  Diagnosis  Date  . Arthritis   . BPH (benign prostatic hyperplasia)   . CHF (congestive heart failure) ([place])    Nonischemic Cardiomyopathy. EF 25-35%  . Chronic kidney disease 05/20/2013   CKD 3  . Colon polyps   . Dyslipidemia   . Dysrhythmia 08/19/11   "low heart beat; maybe 25bpm; got RX; now 60bpm"  . ED (erectile dysfunction)   . Gout   . Heart murmur   . Hyperlipidemia   . Hypertension   . Low back pain 08/12/2013  . Nonfunctioning kidney Recognized 03/2006   Chronically and Severely Hydronephrotic and Nonfunctioning Right Kidney  . Solitary kidney Recognized 03/2006   Solitary Functioning Left Kidney  . Stroke [facility])    "mild stroke; dx'd 2010; don't know when it happened"  . Syncope and collapse 08/19/11   "shallow breathing; sweating horribly"   Past Surgical History:  Procedure Laterality Date  . CARDIOVASCULAR STRESS TEST  08/30/2010   Small to moderate fixed inferoseptal defect. Severe global hypokinesis with inferior akinesis and septal dyskinesis-likely secondary to a prominent LBBB. Non-diagnostic for ischemia due to persantine.  Marland Kitchen KNEE ARTHROSCOPY  1996   right  . TRANSTHORACIC ECHOCARDIOGRAM  08/20/2011   EF 45-50%, severe concentric hyperthrophy.     A IV Location/Drains/Wounds Patient Lines/Drains/Airways Status   Active Line/Drains/Airways    Name:   Placement date:   Placement time:   Site:   Days:   Peripheral IV 06/25/19 Right Antecubital   06/25/19    2015    Antecubital   1  Peripheral IV 06/26/19 Left Forearm   06/26/19    0131    Forearm   less than 1          Intake/Output Last 24 hours  Intake/Output Summary (Last 24 hours) at 06/26/2019 1135 Last data filed at 06/26/2019 0607 Gross per 24 hour  Intake 700 ml  Output -  Net 700 ml    Labs/Imaging Results for orders placed or performed during the hospital encounter of 06/25/19 (from the past 48 hour(s))  CBC     Status: Abnormal   Collection Time: 06/25/19  8:08 PM  Result Value Ref Range    WBC 4.8 4.0 - 10.5 K/uL   RBC 4.08 (L) 4.22 - 5.81 MIL/uL   Hemoglobin 12.1 (L) 13.0 - 17.0 g/dL   HCT 35.9 (L) 39.0 - 52.0 %   MCV 88.0 80.0 - 100.0 fL   MCH 29.7 26.0 - 34.0 pg   MCHC 33.7 30.0 - 36.0 g/dL   RDW 12.9 11.5 - 15.5 %   Platelets 199 150 - 400 K/uL   nRBC 0.0 0.0 - 0.2 %    Comment: Performed at [facility], [place] [address], [place], [place] [address]  Troponin I (High Sensitivity)     Status: Abnormal   Collection Time: 06/25/19  8:08 PM  Result Value Ref Range   Troponin I (High Sensitivity) 88 (H) <18 ng/L    Comment: (NOTE) Elevated high sensitivity troponin I (hsTnI) values and significant  changes across serial measurements may suggest ACS but many other  chronic and acute conditions are known to elevate hsTnI results.  Refer to the "Links" section for chest pain algorithms and additional  guidance. Performed at [facility], [place] [address], [place], [place] [address]   Comprehensive metabolic panel     Status: Abnormal   Collection Time: 06/25/19  8:08 PM  Result Value Ref Range   Sodium 141 135 - 145 mmol/L   Potassium 4.3 3.5 - 5.1 mmol/L   Chloride 106 98 - 111 mmol/L   CO2 24 22 - 32 mmol/L   Glucose, Bld 109 (H) 70 - 99 mg/dL   BUN 28 (H) 8 - 23 mg/dL   Creatinine, Ser 2.04 (H) 0.61 - 1.24 mg/dL   Calcium 9.5 8.9 - 10.3 mg/dL   Total Protein 6.3 (L) 6.5 - 8.1 g/dL   Albumin 3.5 3.5 - 5.0 g/dL   AST 25 15 - 41 U/L   ALT 29 0 - 44 U/L   Alkaline Phosphatase 92 38 - 126 U/L   Total Bilirubin 0.4 0.3 - 1.2 mg/dL   GFR calc non Af Amer 29 (L) >60 mL/min   GFR calc Af Amer 33 (L) >60 mL/min   Anion gap 11 5 - 15    Comment: Performed at [facility] [address], [place], [place] [address]  D-dimer, quantitative (not at [facility])     Status: Abnormal   Collection Time: 06/25/19  8:15 PM  Result Value Ref Range   D-Dimer, Quant 0.82 (H) 0.00 - 0.50 ug/mL-FEU    Comment: (NOTE) At the manufacturer cut-off of 0.50 ug/mL FEU, this  assay has been documented to exclude PE with a sensitivity and negative predictive value of 97 to 99%.  At this time, this assay has not been approved by the FDA to exclude DVT/VTE. Results should be correlated with clinical presentation. Performed at [facility], [place] [address] [place], [place] [address]  Magnesium     Status: None   Collection Time: 06/25/19  8:15 PM  Result Value Ref Range   Magnesium 1.9 1.7 - 2.4 mg/dL    Comment: Performed at [facility], [place] [address] [place], [place] [address]  Phosphorus     Status: None   Collection Time: 06/25/19  8:15 PM  Result Value Ref Range   Phosphorus 3.4 2.5 - 4.6 mg/dL    Comment: Performed at [facility] [address] [place], [place] [address]  Brain natriuretic peptide     Status: Abnormal   Collection Time: 06/25/19  8:18 PM  Result Value Ref Range   B Natriuretic Peptide 1,148.7 (H) 0.0 - 100.0 pg/mL    Comment: Performed at [facility] [address], [place], [place] [address]  SARS Coronavirus 2 [facility] order, Performed in [facility] hospital lab) Nasopharyngeal Nasopharyngeal Swab     Status: None   Collection Time: 06/25/19  9:37 PM   Specimen: Nasopharyngeal Swab  Result Value Ref Range   SARS Coronavirus 2 NEGATIVE NEGATIVE    Comment: (NOTE) If result is NEGATIVE SARS-CoV-2 target nucleic acids are NOT DETECTED. The SARS-CoV-2 RNA is generally detectable in upper and lower  respiratory specimens during the acute phase of infection. The lowest  concentration of SARS-CoV-2 viral copies this assay can detect is 250  copies / mL. A negative result does not preclude SARS-CoV-2 infection  and should not be used as the sole basis for treatment or other  patient management decisions.  A negative result may occur with  improper specimen collection / handling, submission of specimen other  than nasopharyngeal swab, presence of viral mutation(s) within the  areas targeted by this  assay, and inadequate number of viral copies  (<250 copies / mL). A negative result must be combined with clinical  observations, patient history, and epidemiological information. If result is POSITIVE SARS-CoV-2 target nucleic acids are DETECTED. The SARS-CoV-2 RNA is generally detectable in upper and lower  respiratory specimens dur ing the acute phase of infection.  Positive  results are indicative of active infection with SARS-CoV-2.  Clinical  correlation with patient history and other diagnostic information is  necessary to determine patient infection status.  Positive results do  not rule out bacterial infection or co-infection with other viruses. If result is PRESUMPTIVE POSTIVE SARS-CoV-2 nucleic acids MAY BE PRESENT.   A presumptive positive result was obtained on the submitted specimen  and confirmed on repeat testing.  While 2019 novel coronavirus  (SARS-CoV-2) nucleic acids may be present in the submitted sample  additional confirmatory testing may be necessary for epidemiological  and / or clinical management purposes  to differentiate between  SARS-CoV-2 and other Sarbecovirus currently known to infect humans.  If clinically indicated additional testing with an alternate test  methodology [phone]) is advised. The SARS-CoV-2 RNA is generally  detectable in upper and lower respiratory sp ecimens during the acute  phase of infection. The expected result is Negative. Fact Sheet for Patients:  StrictlyIdeas.no Fact Sheet for Healthcare Providers: BankingDealers.co.za This test is not yet approved or cleared by the [place] FDA and has been authorized for detection and/or diagnosis of SARS-CoV-2 by FDA under an Emergency Use Authorization (EUA).  This EUA will remain in effect (meaning this test can be used) for the duration of the COVID-19 declaration under Section 564(b)(1) of the Act, 21 U.S.C. section 360bbb-3(b)(1),  unless the authorization is terminated or revoked sooner.  Performed at [facility], [place] [address], [place], [place] [address]   Troponin I (High Sensitivity)     Status: Abnormal   Collection Time: 06/25/19 10:28 PM  Result Value Ref Range   Troponin I (High Sensitivity) 90 (H) <18 ng/L    Comment: (NOTE) Elevated high sensitivity troponin I (hsTnI) values and significant  changes across serial measurements may suggest ACS but many other  chronic and acute conditions are known to elevate hsTnI results.  Refer to the "Links" section for chest pain algorithms and additional  guidance. Performed at [facility], [place] [address] [place], [place] [address]   CBC     Status: Abnormal   Collection Time: 06/26/19  6:13 AM  Result Value Ref Range   WBC 4.7 4.0 - 10.5 K/uL   RBC 3.90 (L) 4.22 - 5.81 MIL/uL   Hemoglobin 11.0 (L) 13.0 - 17.0 g/dL   HCT 34.8 (L) 39.0 - 52.0 %   MCV 89.2 80.0 - 100.0 fL   MCH 28.2 26.0 - 34.0 pg   MCHC 31.6 30.0 - 36.0 g/dL   RDW 13.2 11.5 - 15.5 %   Platelets 187 150 - 400 K/uL   nRBC 0.0 0.0 - 0.2 %    Comment: Performed at [facility], [place] [address], [place], [place] [address]  Heparin level (unfractionated)     Status: None   Collection Time: 06/26/19  9:51 AM  Result Value Ref Range   Heparin Unfractionated 0.66 0.30 - 0.70 IU/mL    Comment: (NOTE) If heparin results are below expected values, and patient dosage has  been confirmed, suggest follow up testing of antithrombin III levels. Performed at [facility], [place] [address], [place], [place] [address]   TSH     Status: None   Collection Time: 06/26/19  9:51 AM  Result Value Ref Range   TSH 4.437 0.350 - 4.500 uIU/mL    Comment: Performed by a 3rd Generation assay with a functional sensitivity of <=0.01 uIU/mL. Performed at [facility], [place] [address], [place], [place] [address]   Basic metabolic panel     Status: Abnormal   Collection Time: 06/26/19  9:51  AM  Result Value Ref Range   Sodium 142 135 - 145 mmol/L   Potassium 4.5 3.5 - 5.1 mmol/L   Chloride 110 98 - 111 mmol/L   CO2 21 (L) 22 - 32 mmol/L   Glucose, Bld 116 (H) 70 - 99 mg/dL   BUN 27 (H) 8 - 23 mg/dL   Creatinine, Ser 1.94 (H) 0.61 - 1.24 mg/dL   Calcium 9.0 8.9 - 10.3 mg/dL   GFR calc non Af Amer 31 (L) >60 mL/min   GFR calc Af Amer 36 (L) >60 mL/min   Anion gap 11 5 - 15    Comment: Performed at [facility] [address] [place], [place] [address]  CBC     Status: Abnormal   Collection Time: 06/26/19  9:51 AM  Result Value Ref Range   WBC 4.9 4.0 - 10.5 K/uL   RBC 4.14 (L) 4.22 - 5.81 MIL/uL   Hemoglobin 11.8 (L) 13.0 - 17.0 g/dL   HCT 36.4 (L) 39.0 - 52.0 %   MCV 87.9 80.0 - 100.0 fL   MCH 28.5 26.0 - 34.0 pg   MCHC 32.4 30.0 - 36.0 g/dL   RDW 13.2 11.5 - 15.5 %   Platelets 196 150 - 400  K/uL   nRBC 0.0 0.0 - 0.2 %    Comment: Performed at [facility], [place] [address], [place], [place] [address]   Dg Chest 2 View  Result Date: 06/25/2019 CLINICAL DATA:  Dyspnea.  Chest heaviness. EXAM: CHEST - 2 VIEW COMPARISON:  05/09/2019 chest radiograph. FINDINGS: Stable cardiomediastinal silhouette with borderline mild cardiomegaly. No pneumothorax. No pleural effusion. Lungs appear clear, with no acute consolidative airspace disease and no pulmonary edema. IMPRESSION: Borderline mild cardiomegaly, without pulmonary edema. No active pulmonary disease. Electronically Signed   By: Ilona Sorrel M.D.   On: 06/25/2019 18:34   Ct Angio Chest Pe W And/or Wo Contrast  Result Date: 06/25/2019 CLINICAL DATA:  Shortness of breath he had EXAM: CT ANGIOGRAPHY CHEST WITH CONTRAST TECHNIQUE: Multidetector CT imaging of the chest was performed using the standard protocol during bolus administration of intravenous contrast. Multiplanar CT image reconstructions and MIPs were obtained to evaluate the vascular anatomy. CONTRAST:  90mL OMNIPAQUE IOHEXOL 350 MG/ML SOLN COMPARISON:   None. FINDINGS: Cardiovascular: There is a optimal opacification of the pulmonary arteries. There is no central,segmental, or subsegmental filling defects within the pulmonary arteries. There is mild cardiomegaly. Left ventricular enlargement is. There is normal three-vessel brachiocephalic anatomy without proximal stenosis. Scattered atherosclerosis is noted. Mediastinum/Nodes: No hilar, mediastinal, or axillary adenopathy. Thyroid gland, trachea, and esophagus demonstrate no significant findings. Lungs/Pleura: Minimal streaky atelectasis seen at the bilateral lower lungs. No pleural effusion or pneumothorax. No airspace consolidation. Upper Abdomen: No acute abnormalities present in the visualized portions of the upper abdomen. Musculoskeletal: No chest wall abnormality. No acute or significant osseous findings. Review of the MIP images confirms the above findings. IMPRESSION: 1. No central, segmental, or subsegmental pulmonary embolism. 2. Mild cardiomegaly.  With left ventricular enlargement 3. Minimal bibasilar streaky atelectasis. Electronically Signed   By: Prudencio Pair M.D.   On: 06/25/2019 22:36    Pending Labs Unresulted Labs (From admission, onward)    Start     Ordered   06/27/19 0500  Heparin level (unfractionated)  Daily,   R     06/26/19 0109   06/26/19 1800  Heparin level (unfractionated)  Once-Timed,   STAT     06/26/19 1107   06/26/19 0500  CBC  Daily,   R     06/26/19 0109          Vitals/Pain Today's Vitals   06/26/19 0900 06/26/19 0930 06/26/19 1030 06/26/19 1100  BP: 105/86 111/88 117/72 119/83  Pulse: (!) 144  76 77  Resp: (!) 24 (!) 22 (!) 26 (!) 25  Temp:      SpO2: 99%  98% 98%  Weight:      Height:      PainSc:        Isolation Precautions No active isolations  Medications Medications  acetaminophen (TYLENOL) tablet 500-1,000 mg (has no administration in time range)  aspirin EC tablet 81 mg (has no administration in time range)  traMADol (ULTRAM) tablet  50 mg (has no administration in time range)  amLODipine (NORVASC) tablet 5 mg (has no administration in time range)  atorvastatin (LIPITOR) tablet 80 mg (has no administration in time range)  carvedilol (COREG) tablet 6.25 mg (6.25 mg Oral Given 06/26/19 0827)  sacubitril-valsartan (ENTRESTO) 97-103 mg per tablet (has no administration in time range)  spironolactone (ALDACTONE) tablet 12.5 mg (has no administration in time range)  ondansetron (ZOFRAN) injection 4 mg (has no administration in time range)  amiodarone (NEXTERONE) 1.8 mg/mL load  via infusion 150 mg (150 mg Intravenous Bolus from Bag 06/26/19 0234)    Followed by  amiodarone (NEXTERONE PREMIX) 360-4.14 MG/200ML-% (1.8 mg/mL) IV infusion (0 mg/hr Intravenous Stopped 06/26/19 0607)    Followed by  amiodarone (NEXTERONE PREMIX) 360-4.14 MG/200ML-% (1.8 mg/mL) IV infusion (30 mg/hr Intravenous New Bag/Given 06/26/19 0608)  heparin ADULT infusion 100 units/mL (25000 units/237mL sodium chloride 0.45%) (1,100 Units/hr Intravenous New Bag/Given 06/26/19 0237)  sodium chloride flush (NS) 0.9 % injection 3 mL (3 mLs Intravenous Given 06/25/19 2020)  metoprolol tartrate (LOPRESSOR) injection 2.5 mg (2.5 mg Intravenous Given 06/25/19 2019)  sodium chloride 0.9 % bolus 500 mL (0 mLs Intravenous Stopped 06/25/19 2117)  metoprolol tartrate (LOPRESSOR) injection 2.5 mg (2.5 mg Intravenous Given 06/25/19 2046)  metoprolol tartrate (LOPRESSOR) injection 2.5 mg (2.5 mg Intravenous Given 06/25/19 2113)  adenosine (ADENOCARD) 6 MG/2ML injection 6 mg (6 mg Intravenous Given 06/25/19 2238)  iohexol (OMNIPAQUE) 350 MG/ML injection 75 mL (75 mLs Intravenous Contrast Given 06/25/19 2215)  heparin bolus via infusion 3,900 Units (3,900 Units Intravenous Bolus from Bag 06/26/19 0238)    Mobility walks Low fall risk   Focused Assessments    R Recommendations: See Admitting Provider Note  Report given to: 6E, RN  Additional Notes:

## 2019-06-26 NOTE — Progress Notes (Signed)
ANTICOAGULATION CONSULT NOTE  Pharmacy Consult for heparin Indication: atrial fibrillation   Patient Measurements: Height: 5\' 7"  (170.2 cm) Weight: 172 lb (78 kg) IBW/kg (Calculated) : 66.1 Heparin Dosing Weight: 78 kg  Vital Signs: BP: 117/72 (10/03 1030) Pulse Rate: 76 (10/03 1030)  Labs: Recent Labs    06/25/19 2008 06/25/19 2228 06/26/19 0613 06/26/19 0951  HGB 12.1*  --  11.0* 11.8*  HCT 35.9*  --  34.8* 36.4*  PLT 199  --  187 196  HEPARINUNFRC  --   --   --  0.66  CREATININE 2.04*  --   --  1.94*  TROPONINIHS 88* 90*  --   --      Medical History: Past Medical History:  Diagnosis Date  . Arthritis   . BPH (benign prostatic hyperplasia)   . CHF (congestive heart failure) ([place])    Nonischemic Cardiomyopathy. EF 25-35%  . Chronic kidney disease 05/20/2013   CKD 3  . Colon polyps   . Dyslipidemia   . Dysrhythmia 08/19/11   "low heart beat; maybe 25bpm; got RX; now 60bpm"  . ED (erectile dysfunction)   . Gout   . Heart murmur   . Hyperlipidemia   . Hypertension   . Low back pain 08/12/2013  . Nonfunctioning kidney Recognized 03/2006   Chronically and Severely Hydronephrotic and Nonfunctioning Right Kidney  . Solitary kidney Recognized 03/2006   Solitary Functioning Left Kidney  . Stroke [facility])    "mild stroke; dx'd 2010; don't know when it happened"  . Syncope and collapse 08/19/11   "shallow breathing; sweating horribly"    Medications:  Scheduled:  . amLODipine  5 mg Oral Daily  . aspirin EC  81 mg Oral QHS  . atorvastatin  80 mg Oral QHS  . carvedilol  6.25 mg Oral BID WC  . sacubitril-valsartan  1 tablet Oral BID  . spironolactone  12.5 mg Oral Daily    Assessment: 39 yom presenting with CP and SOB - found to be in atypial flutter. No anticoagulation prior to admission. Heparin originally started for what was thought to be Aflutter. Patient was then given Adenosine for potential SVT - this did not slow the HR. He remains on heparin while he is  being worked up. Heparin rate was therapeutic this morning. CBC wnl.   Goal of Therapy:  Heparin level 0.3-0.7 units/ml Monitor platelets by anticoagulation protocol: Yes    Plan:  -Continue heparin 1100 units/hr -Daily HL, CBC -Check level this afternoon   Harvel Quale 06/26/2019 11:02 AM

## 2019-06-27 DIAGNOSIS — I4892 Unspecified atrial flutter: Principal | ICD-10-CM

## 2019-06-27 LAB — CBC
HCT: 33.7 % — ABNORMAL LOW (ref 39.0–52.0)
Hemoglobin: 11.1 g/dL — ABNORMAL LOW (ref 13.0–17.0)
MCH: 29.1 pg (ref 26.0–34.0)
MCHC: 32.9 g/dL (ref 30.0–36.0)
MCV: 88.5 fL (ref 80.0–100.0)
Platelets: 167 10*3/uL (ref 150–400)
RBC: 3.81 MIL/uL — ABNORMAL LOW (ref 4.22–5.81)
RDW: 13.1 % (ref 11.5–15.5)
WBC: 4.6 10*3/uL (ref 4.0–10.5)
nRBC: 0 % (ref 0.0–0.2)

## 2019-06-27 LAB — NOVEL CORONAVIRUS, NAA (HOSP ORDER, SEND-OUT TO REF LAB; TAT 18-24 HRS): SARS-CoV-2, NAA: NOT DETECTED

## 2019-06-27 LAB — HEPARIN LEVEL (UNFRACTIONATED): Heparin Unfractionated: 0.68 IU/mL (ref 0.30–0.70)

## 2019-06-27 MED ORDER — INFLUENZA VAC A&B SA ADJ QUAD 0.5 ML IM PRSY
0.5000 mL | PREFILLED_SYRINGE | INTRAMUSCULAR | Status: DC
  Filled 2019-06-27: qty 0.5

## 2019-06-27 MED ORDER — DILTIAZEM HCL 60 MG PO TABS
60.0000 mg | ORAL_TABLET | Freq: Three times a day (TID) | ORAL | Status: DC
  Administered 2019-06-27 – 2019-06-28 (×4): 60 mg via ORAL
  Filled 2019-06-27 (×4): qty 1

## 2019-06-27 MED ORDER — OFF THE BEAT BOOK
Freq: Once | Status: AC
  Administered 2019-06-28: 10:00:00
  Filled 2019-06-27: qty 1

## 2019-06-27 NOTE — Plan of Care (Signed)

## 2019-06-27 NOTE — Progress Notes (Signed)
ANTICOAGULATION CONSULT NOTE  Pharmacy Consult for heparin Indication: atrial fibrillation   Patient Measurements: Height: 5\' 7"  (170.2 cm) Weight: 173 lb 4.8 oz (78.6 kg) IBW/kg (Calculated) : 66.1 Heparin Dosing Weight: 78 kg  Vital Signs: Temp: 97.6 F (36.4 C) (10/04 0608) Temp Source: Oral (10/04 0608) BP: 115/71 (10/04 0827) Pulse Rate: 125 (10/04 0800)  Labs: Recent Labs    06/25/19 2008 06/25/19 2228 06/26/19 0613 06/26/19 0951 06/26/19 1811 06/27/19 0748  HGB 12.1*  --  11.0* 11.8*  --  11.1*  HCT 35.9*  --  34.8* 36.4*  --  33.7*  PLT 199  --  187 196  --  167  HEPARINUNFRC  --   --   --  0.66 0.59 0.68  CREATININE 2.04*  --   --  1.94*  --   --   TROPONINIHS 88* 90*  --   --   --   --      Medical History: Past Medical History:  Diagnosis Date  . Arthritis   . BPH (benign prostatic hyperplasia)   . CHF (congestive heart failure) ([place])    Nonischemic Cardiomyopathy. EF 25-35%  . Chronic kidney disease 05/20/2013   CKD 3  . Colon polyps   . Dyslipidemia   . Dysrhythmia 08/19/11   "low heart beat; maybe 25bpm; got RX; now 60bpm"  . ED (erectile dysfunction)   . Gout   . Heart murmur   . Hyperlipidemia   . Hypertension   . Low back pain 08/12/2013  . Nonfunctioning kidney Recognized 03/2006   Chronically and Severely Hydronephrotic and Nonfunctioning Right Kidney  . Solitary kidney Recognized 03/2006   Solitary Functioning Left Kidney  . Stroke [facility])    "mild stroke; dx'd 2010; don't know when it happened"  . Syncope and collapse 08/19/11   "shallow breathing; sweating horribly"    Medications:  Scheduled:  . amLODipine  5 mg Oral Daily  . aspirin EC  81 mg Oral QHS  . atorvastatin  80 mg Oral QHS  . carvedilol  6.25 mg Oral BID WC  . diltiazem  60 mg Oral Q8H  . sacubitril-valsartan  1 tablet Oral BID  . spironolactone  12.5 mg Oral Daily    Assessment: 28 yom presenting with CP and SOB - found to be in atypical flutter. No  anticoagulation prior to admission. Heparin originally started for what was thought to be Aflutter. Patient was then given Adenosine for potential SVT - this did not slow the HR. He remains on heparin while he is being worked up.   Heparin level trending up to 0.68 this morning while on 1100 units/hour with out adjustments. Will decrease rate to keep therapeutic.  Goal of Therapy:  Heparin level 0.3-0.7 units/ml Monitor platelets by anticoagulation protocol: Yes    Plan:  Decrease heparin gtt to 1050 units/hr Daily heparin level, CBC, s/s bleeding    Thank you for allowing pharmacy to participate in this patient's care.  Halaina Vanduzer L. Devin Going, PharmD, [place] PGY1 Pharmacy Resident 06/27/19      8:41 AM  Please check AMION for all [place] phone numbers After 10:00 PM, call the [place] [phone]

## 2019-06-27 NOTE — Plan of Care (Signed)
  Problem: Education: Goal: Understanding of medication regimen will improve Outcome: Progressing   Problem: Cardiac: Goal: Ability to achieve and maintain adequate cardiopulmonary perfusion will improve Outcome: Progressing   

## 2019-06-27 NOTE — Progress Notes (Signed)
Progress Note  Patient Name: John Hines Date of Encounter: 06/27/2019  Primary Cardiologist: Shelva Majestic, MD   Subjective   Currently feeling well while at rest.  Since being in the hospital he is continued to have episodes of sinus rhythm, but he has gone into atrial flutter when he is up and moving around.  Otherwise he feels well.  Inpatient Medications    Scheduled Meds: . amLODipine  5 mg Oral Daily  . aspirin EC  81 mg Oral QHS  . atorvastatin  80 mg Oral QHS  . carvedilol  6.25 mg Oral BID WC  . diltiazem  60 mg Oral Q8H  . sacubitril-valsartan  1 tablet Oral BID  . spironolactone  12.5 mg Oral Daily   Continuous Infusions: . amiodarone 30 mg/hr (06/27/19 0716)  . heparin 1,100 Units/hr (06/26/19 2126)   PRN Meds: acetaminophen, ondansetron (ZOFRAN) IV, traMADol   Vital Signs    Vitals:   06/26/19 1318 06/26/19 2011 06/26/19 2326 06/27/19 0608  BP: 127/76 98/86 113/71 109/86  Pulse: 80 (!) 138 73 (!) 134  Resp: 18 20 (!) 21 19  Temp: 98.2 F (36.8 C) 97.6 F (36.4 C) (!) 97.5 F (36.4 C) 97.6 F (36.4 C)  TempSrc: Oral Oral Oral Oral  SpO2: 98% 98% 98% 94%  Weight:    78.6 kg  Height:        Intake/Output Summary (Last 24 hours) at 06/27/2019 0755 Last data filed at 06/27/2019 0100 Gross per 24 hour  Intake 597.41 ml  Output -  Net 597.41 ml   Last 3 Weights 06/27/2019 06/25/2019 05/09/2019  Weight (lbs) 173 lb 4.8 oz 172 lb 172 lb  Weight (kg) 78.608 kg 78.019 kg 78.019 kg      Telemetry    Sinus rhythm with episodes of atrial flutter- Personally Reviewed  ECG    None new - Personally Reviewed  Physical Exam   GEN: No acute distress.   Neck: No JVD Cardiac:  Tachycardic, regular, no murmurs, rubs, or gallops.  Respiratory: Clear to auscultation bilaterally. GI: Soft, nontender, non-distended  MS: No edema; No deformity. Neuro:  Nonfocal  Psych: Normal affect   Labs    High Sensitivity Troponin:   Recent Labs  Lab 06/25/19  2008 06/25/19 2228  TROPONINIHS 88* 90*      Chemistry Recent Labs  Lab 06/25/19 2008 06/26/19 0951  NA 141 142  K 4.3 4.5  CL 106 110  CO2 24 21*  GLUCOSE 109* 116*  BUN 28* 27*  CREATININE 2.04* 1.94*  CALCIUM 9.5 9.0  PROT 6.3*  --   ALBUMIN 3.5  --   AST 25  --   ALT 29  --   ALKPHOS 92  --   BILITOT 0.4  --   GFRNONAA 29* 31*  GFRAA 33* 36*  ANIONGAP 11 11     Hematology Recent Labs  Lab 06/25/19 2008 06/26/19 0613 06/26/19 0951  WBC 4.8 4.7 4.9  RBC 4.08* 3.90* 4.14*  HGB 12.1* 11.0* 11.8*  HCT 35.9* 34.8* 36.4*  MCV 88.0 89.2 87.9  MCH 29.7 28.2 28.5  MCHC 33.7 31.6 32.4  RDW 12.9 13.2 13.2  PLT 199 187 196    BNP Recent Labs  Lab 06/25/19 2018  BNP 1,148.7*     DDimer  Recent Labs  Lab 06/25/19 2015  DDIMER 0.82*     Radiology    Dg Chest 2 View  Result Date: 06/25/2019 CLINICAL DATA:  Dyspnea.  Chest  heaviness. EXAM: CHEST - 2 VIEW COMPARISON:  05/09/2019 chest radiograph. FINDINGS: Stable cardiomediastinal silhouette with borderline mild cardiomegaly. No pneumothorax. No pleural effusion. Lungs appear clear, with no acute consolidative airspace disease and no pulmonary edema. IMPRESSION: Borderline mild cardiomegaly, without pulmonary edema. No active pulmonary disease. Electronically Signed   By: Ilona Sorrel M.D.   On: 06/25/2019 18:34   Ct Angio Chest Pe W And/or Wo Contrast  Result Date: 06/25/2019 CLINICAL DATA:  Shortness of breath he had EXAM: CT ANGIOGRAPHY CHEST WITH CONTRAST TECHNIQUE: Multidetector CT imaging of the chest was performed using the standard protocol during bolus administration of intravenous contrast. Multiplanar CT image reconstructions and MIPs were obtained to evaluate the vascular anatomy. CONTRAST:  54mL OMNIPAQUE IOHEXOL 350 MG/ML SOLN COMPARISON:  None. FINDINGS: Cardiovascular: There is a optimal opacification of the pulmonary arteries. There is no central,segmental, or subsegmental filling defects within  the pulmonary arteries. There is mild cardiomegaly. Left ventricular enlargement is. There is normal three-vessel brachiocephalic anatomy without proximal stenosis. Scattered atherosclerosis is noted. Mediastinum/Nodes: No hilar, mediastinal, or axillary adenopathy. Thyroid gland, trachea, and esophagus demonstrate no significant findings. Lungs/Pleura: Minimal streaky atelectasis seen at the bilateral lower lungs. No pleural effusion or pneumothorax. No airspace consolidation. Upper Abdomen: No acute abnormalities present in the visualized portions of the upper abdomen. Musculoskeletal: No chest wall abnormality. No acute or significant osseous findings. Review of the MIP images confirms the above findings. IMPRESSION: 1. No central, segmental, or subsegmental pulmonary embolism. 2. Mild cardiomegaly.  With left ventricular enlargement 3. Minimal bibasilar streaky atelectasis. Electronically Signed   By: Prudencio Pair M.D.   On: 06/25/2019 22:36    Cardiac Studies   TTE 08/31/18 - Left ventricle: The cavity size was normal. There was severe   focal basal hypertrophy of the septum. Systolic function was   moderately to severely reduced. The estimated ejection fraction   was in the range of 30% to 35%. Diffuse hypokinesis. Doppler   parameters are consistent with abnormal left ventricular   relaxation (grade 1 diastolic dysfunction). - Aortic valve: Trileaflet; mildly thickened, mildly calcified   leaflets. There was trivial regurgitation. - Left atrium: The atrium was mildly dilated.  Patient Profile     85 y.o. male with a history significant for chronic systolic heart failure, CKD stage III, hyperlipidemia, gout, hypertension, left bundle branch block who presented to the hospital with palpitations, found to be in likely atrial flutter.  Assessment & Plan    1.  Atrial flutter: Per the fellow 2 nights ago, the patient would become bradycardic and he could potentially see flutter waves.  He is  currently on amiodarone and heparin drip.  He has been having intermittent episodes of sinus rhythm.  I do feel that he Fabrizio Filip likely need more amiodarone.  He was having episodes of sinus rhythm on admission to the hospital and thus I think his stroke risk is low for cardioversion with such a short dose of anticoagulation.  I have also started him on oral diltiazem for improved rate control.  2.  Heart failure with reduced ejection fraction: Minimal signs of volume overload though his BNP is elevated.  Currently on Coreg, Entresto, Aldactone.  We Teran Daughenbaugh give him a dose of Lasix today as his BNP was elevated.  Diuresis may help with remaining in sinus rhythm.  3.  Acute on chronic renal failure: Creatinine appears elevated from its baseline.  Potentially due to his atrial arrhythmias.  Planning gentle diuresis.  For questions or  updates, please contact [place] Please consult [email or web address] for contact info under        Signed, Savannah Morford Meredith Leeds, MD  06/27/2019, 7:55 AM

## 2019-06-28 ENCOUNTER — Ambulatory Visit (INDEPENDENT_AMBULATORY_CARE_PROVIDER_SITE_OTHER): Payer: Medicare Other | Admitting: Family Medicine

## 2019-06-28 DIAGNOSIS — I4892 Unspecified atrial flutter: Secondary | ICD-10-CM

## 2019-06-28 DIAGNOSIS — I509 Heart failure, unspecified: Secondary | ICD-10-CM | POA: Diagnosis not present

## 2019-06-28 LAB — CBC
HCT: 32.8 % — ABNORMAL LOW (ref 39.0–52.0)
Hemoglobin: 10.8 g/dL — ABNORMAL LOW (ref 13.0–17.0)
MCH: 28.3 pg (ref 26.0–34.0)
MCHC: 32.9 g/dL (ref 30.0–36.0)
MCV: 85.9 fL (ref 80.0–100.0)
Platelets: 169 10*3/uL (ref 150–400)
RBC: 3.82 MIL/uL — ABNORMAL LOW (ref 4.22–5.81)
RDW: 13.1 % (ref 11.5–15.5)
WBC: 4.9 10*3/uL (ref 4.0–10.5)
nRBC: 0 % (ref 0.0–0.2)

## 2019-06-28 LAB — HEPARIN LEVEL (UNFRACTIONATED): Heparin Unfractionated: 0.47 IU/mL (ref 0.30–0.70)

## 2019-06-28 MED ORDER — APIXABAN 2.5 MG PO TABS
2.5000 mg | ORAL_TABLET | Freq: Two times a day (BID) | ORAL | Status: DC
  Administered 2019-06-28: 10:00:00 2.5 mg via ORAL
  Filled 2019-06-28: qty 1

## 2019-06-28 MED ORDER — APIXABAN 2.5 MG PO TABS: 2.5000 mg | ORAL_TABLET | Freq: Two times a day (BID) | ORAL | 3 refills | Status: DC

## 2019-06-28 MED ORDER — AMIODARONE HCL 200 MG PO TABS: 200.0000 mg | ORAL_TABLET | Freq: Two times a day (BID) | ORAL | 3 refills | Status: DC

## 2019-06-28 MED ORDER — DILTIAZEM HCL 60 MG PO TABS: 60.0000 mg | ORAL_TABLET | Freq: Three times a day (TID) | ORAL | 3 refills | Status: DC

## 2019-06-28 MED ORDER — AMIODARONE HCL 200 MG PO TABS
400.0000 mg | ORAL_TABLET | Freq: Two times a day (BID) | ORAL | Status: DC
  Administered 2019-06-28: 400 mg via ORAL
  Filled 2019-06-28: qty 2

## 2019-06-28 MED ORDER — CARVEDILOL 12.5 MG PO TABS
12.5000 mg | ORAL_TABLET | Freq: Two times a day (BID) | ORAL | Status: DC
  Administered 2019-06-28: 12.5 mg via ORAL
  Filled 2019-06-28: qty 1

## 2019-06-28 MED ORDER — AMIODARONE HCL 200 MG PO TABS: ORAL_TABLET | ORAL | 0 refills | Status: DC

## 2019-06-28 MED ORDER — AMIODARONE HCL 400 MG PO TABS: 400.0000 mg | ORAL_TABLET | Freq: Two times a day (BID) | ORAL | 0 refills | Status: DC

## 2019-06-28 MED FILL — ELIQUIS 2.5 MG TABLET: 2.5 | 30 days supply | Qty: 60 | Fill #0 | Status: TO

## 2019-06-28 MED FILL — AMIODARONE HCL 200 MG TABS: 200 | 30 days supply | Qty: 74 | Fill #0

## 2019-06-28 MED FILL — dilTIAZem HCL 60 MG TABS: 60 | 40 days supply | Qty: 120 | Fill #0 | Status: TO

## 2019-06-28 NOTE — TOC Benefit Eligibility Note (Signed)
Transition of Care [facility]) Benefit Eligibility Note    Patient Details  Name: John Hines MRN: [id number] Date of Birth: Dec 28, 1931   Medication/Dose: Arne Cleveland   2.5 MG  AND  ELIQUIS  5 MG BID  Covered?: Yes  Tier: 3 Drug  Prescription Coverage Preferred Pharmacy: [facility]  Spoke with Person/Company/Phone Number:: RITA   @ [employer] # [phone]  Co-Pay: $47.00  Prior Approval: No  Deductible: Met       Memory Argue Phone Number: 06/28/2019, 11:13 AM

## 2019-06-28 NOTE — Progress Notes (Signed)
Discharge order written. Instructions reviewed with and given to patient and his son. All questions answered. IVs and Telebox removed per protocol - patient currently waiting on meds to be delivered by TOC.

## 2019-06-28 NOTE — Progress Notes (Addendum)
Feeling OK at rest. Continues to have intermittent palpitations and paroxysms of atrial flutter.   Will plan transition to amiodarone 400 mg BID x 1 week, then 200 mg BID Will plan transition to Eliquis Increase coreg to 12.5 mg BID  Will plan discharge today with very close follow up if remains stable.   Full note pending disposition.   Legrand Como [address]" [place], PA-C  06/28/2019 8:22 AM

## 2019-06-28 NOTE — Progress Notes (Addendum)
ANTICOAGULATION CONSULT NOTE  Pharmacy Consult for heparin Indication: atrial fibrillation   Patient Measurements: Height: 5\' 7"  (170.2 cm) Weight: 172 lb 8 oz (78.2 kg) IBW/kg (Calculated) : 66.1 Heparin Dosing Weight: 78 kg  Vital Signs: Temp: 97.4 F (36.3 C) (10/05 0539) Temp Source: Oral (10/05 0539) BP: 119/76 (10/05 0539) Pulse Rate: 70 (10/05 0539)  Labs: Recent Labs    06/25/19 2008 06/25/19 2228  06/26/19 0951 06/26/19 1811 06/27/19 0748 06/28/19 0442  HGB 12.1*  --    < > 11.8*  --  11.1* 10.8*  HCT 35.9*  --    < > 36.4*  --  33.7* 32.8*  PLT 199  --    < > 196  --  167 169  HEPARINUNFRC  --   --    < > 0.66 0.59 0.68 0.47  CREATININE 2.04*  --   --  1.94*  --   --   --   TROPONINIHS 88* 90*  --   --   --   --   --    < > = values in this interval not displayed.     Medical History: Past Medical History:  Diagnosis Date  . Arthritis   . BPH (benign prostatic hyperplasia)   . CHF (congestive heart failure) ([place])    Nonischemic Cardiomyopathy. EF 25-35%  . Chronic kidney disease 05/20/2013   CKD 3  . Colon polyps   . Dyslipidemia   . Dysrhythmia 08/19/11   "low heart beat; maybe 25bpm; got RX; now 60bpm"  . ED (erectile dysfunction)   . Gout   . Heart murmur   . Hyperlipidemia   . Hypertension   . Low back pain 08/12/2013  . Nonfunctioning kidney Recognized 03/2006   Chronically and Severely Hydronephrotic and Nonfunctioning Right Kidney  . Solitary kidney Recognized 03/2006   Solitary Functioning Left Kidney  . Stroke [facility])    "mild stroke; dx'd 2010; don't know when it happened"  . Syncope and collapse 08/19/11   "shallow breathing; sweating horribly"    Medications:  Scheduled:  . amLODipine  5 mg Oral Daily  . aspirin EC  81 mg Oral QHS  . atorvastatin  80 mg Oral QHS  . carvedilol  12.5 mg Oral BID WC  . diltiazem  60 mg Oral Q8H  . influenza vaccine adjuvanted  0.5 mL Intramuscular Tomorrow-1000  . off the beat book   Does not  apply Once  . sacubitril-valsartan  1 tablet Oral BID  . spironolactone  12.5 mg Oral Daily    Assessment: 58 yom presenting with CP and SOB - found to be in atypical flutter. No anticoagulation prior to admission. Heparin originally started for what was thought to be Aflutter. Patient was then given Adenosine for potential SVT - this did not slow the HR. He remains on heparin while he is being worked up.   Heparin level is therapeutic at 0.47, CBC stable.  Goal of Therapy:  Heparin level 0.3-0.7 units/ml Monitor platelets by anticoagulation protocol: Yes    Plan:  -Continue heparin 1050 units/hr -Daily heparin level and CBC  ADDENDUM: Pharmacy asked to transition to apixaban. Cr 1.9, age 85 yr, Wt 38kg.  Plan: -Stop IV heparin -Begin apixaban 2.5mg  BID   Arrie Senate, PharmD, BCPS Clinical Pharmacist [phone] Please check AMION for all [place] numbers 06/28/2019

## 2019-06-28 NOTE — Discharge Summary (Addendum)
ELECTROPHYSIOLOGY PROCEDURE DISCHARGE SUMMARY    Patient ID: John Hines,  MRN: [id number], DOB/AGE: 25-Nov-1931 85 y.o.  Admit date: 06/25/2019 Discharge date: 06/28/2019  Primary Care Physician: Tonia Ghent, MD  Primary Cardiologist: Shelva Majestic, MD  Primary Electrophysiologist: Dr. Curt Bears  Primary Discharge Diagnosis:  Active Problems:   Atrial flutter ([place])   Allergies  Allergen Reactions  . Lisinopril Other (See Comments) and Cough    Possible history of cough on med.   . Nsaids Other (See Comments)    Avoid due to renal status  . Penicillins Swelling and Rash    Has patient had a PCN reaction causing immediate rash, facial/tongue/throat swelling, SOB or lightheadedness with hypotension: Yes- chest swells Has patient had a PCN reaction causing severe rash involving mucus membranes or skin necrosis: No Has patient had a PCN reaction that required hospitalization: No Has patient had a PCN reaction occurring within the last 10 years: No If all of the above answers are "NO", then may proceed with Cephalosporin use.    Brief HPI: John Hines is a 85 y.o. male with PMH of chronic systolic CHF, CKD III, HLD, gout, HTN, and LBBB who presented to the hospital with palpitations with worsening fatigue and SOB x 2 weeks and found to have SVT with ? Atrial flutter.  Hospital Course:  Pt started on amiodarone gtt and heparin. Pt continue to have paroxysms of SVTs with occasional atrial fibrillation on amio and heparin.  Rate control increased. Atrial tachycardia thought more likely than flutter without clear flutter waves. Given dose of lasix with BNP elevation.   Pt examined on 06/28/2019. He continue to have paroxysms of Atrial fibrillation with occasional SVTs. With intermittent periods of sinus, DCCV deferred.  Amiodarone transitioned to po and started on Eliquis. Coreg increased for additional rate control.  Pt feeling better and thought to be stable for discharge  with very close follow up as below.   [name] be discharged on amiodarone 400 mg BID x 1 week, then 200 mg BID.   Physical Exam: Vitals:   06/27/19 1427 06/27/19 1700 06/27/19 2150 06/28/19 0539  BP: 128/66 104/83 120/77 119/76  Pulse:  (!) 117  70  Resp:  19 17 13   Temp:  97.8 F (36.6 C) 98.1 F (36.7 C) (!) 97.4 F (36.3 C)  TempSrc:  Oral Oral Oral  SpO2:  99%  96%  Weight:    78.2 kg  Height:        GEN- The patient is well appearing, alert and oriented x 3 today.   HEENT: normocephalic, atraumatic; sclera clear, conjunctiva pink; hearing intact; oropharynx clear; neck supple, no JVP Lymph- no cervical lymphadenopathy Lungs- Clear to ausculation bilaterally, normal work of breathing.  No wheezes, rales, rhonchi Heart- Regular rate and rhythm at this time, no murmurs, rubs or gallops, PMI not laterally displaced GI- soft, non-tender, non-distended, bowel sounds present, no hepatosplenomegaly Extremities- no clubbing, cyanosis, or edema; DP/PT/radial pulses 2+ bilaterally MS- no significant deformity or atrophy Skin- warm and dry, no rash or lesion Psych- euthymic mood, full affect Neuro- strength and sensation are intact    Labs:   Lab Results  Component Value Date   WBC 4.9 06/28/2019   HGB 10.8 (L) 06/28/2019   HCT 32.8 (L) 06/28/2019   MCV 85.9 06/28/2019   PLT 169 06/28/2019    Recent Labs  Lab 06/25/19 2008 06/26/19 0951  NA 141 142  K 4.3 4.5  CL 106  110  CO2 24 21*  BUN 28* 27*  CREATININE 2.04* 1.94*  CALCIUM 9.5 9.0  PROT 6.3*  --   BILITOT 0.4  --   ALKPHOS 92  --   ALT 29  --   AST 25  --   GLUCOSE 109* 116*     Discharge Medications:  Allergies as of 06/28/2019      Reactions   Lisinopril Other (See Comments), Cough   Possible history of cough on med.    Nsaids Other (See Comments)   Avoid due to renal status   Penicillins Swelling, Rash   Has patient had a PCN reaction causing immediate rash, facial/tongue/throat swelling, SOB or  lightheadedness with hypotension: Yes- chest swells Has patient had a PCN reaction causing severe rash involving mucus membranes or skin necrosis: No Has patient had a PCN reaction that required hospitalization: No Has patient had a PCN reaction occurring within the last 10 years: No If all of the above answers are "NO", then may proceed with Cephalosporin use.      Medication List    TAKE these medications   acetaminophen 500 MG tablet Commonly known as: TYLENOL Take 500-1,000 mg by mouth every 8 (eight) hours as needed for mild pain or headache.   amiodarone 200 MG tablet Commonly known as: Pacerone Take 2 tablets (400 mg total) twice daily for 1 week, then take 1 tablet (200 mg total) twice daily.   amLODipine 5 MG tablet Commonly known as: NORVASC TAKE 1 TABLET BY MOUTH EVERY DAY   apixaban 2.5 MG Tabs tablet Commonly known as: ELIQUIS Take 1 tablet (2.5 mg total) by mouth 2 (two) times daily.   aspirin EC 81 MG tablet Take 81 mg by mouth at bedtime.   atorvastatin 80 MG tablet Commonly known as: LIPITOR TAKE 1 TABLET BY MOUTH EVERY DAY What changed: when to take this   benzonatate 200 MG capsule Commonly known as: TESSALON Take 1 capsule (200 mg total) by mouth 3 (three) times daily as needed.   carvedilol 6.25 MG tablet Commonly known as: COREG Take 1 tablet (6.25 mg total) by mouth 2 (two) times daily with a meal.   diltiazem 60 MG tablet Commonly known as: CARDIZEM Take 1 tablet (60 mg total) by mouth every 8 (eight) hours.   ELDERBERRY PO Take 1 tablet by mouth at bedtime.   Entresto 97-103 MG Generic drug: sacubitril-valsartan TAKE 1 TABLET BY MOUTH TWICE A DAY   ferrous sulfate 325 (65 FE) MG tablet TAKE 1 TABLET (325 MG TOTAL) BY MOUTH DAILY. What changed: when to take this   Fish Oil 1000 MG Caps Take 1,000 mg by mouth 3 (three) times a week.   One-A-Day [employer] Immunity Chew Chew 2 tablets by mouth 2 (two) times a week.   sildenafil 100  MG tablet Commonly known as: VIAGRA Take 1 tablet (100 mg total) by mouth daily as needed for erectile dysfunction.   spironolactone 25 MG tablet Commonly known as: ALDACTONE Take 0.5 tablets (12.5 mg total) by mouth daily.   traMADol 50 MG tablet Commonly known as: ULTRAM Take 1 tablet (50 mg total) by mouth every 12 (twelve) hours as needed.       Disposition: Pt is being discharged home today in good condition.  Follow-up Information    Shirley Friar, PA-C Follow up on 07/07/2019.   Specialty: Physician Assistant Why: at 125 pm for post hospital follow up Contact information: [place]  [id number] [phone]          Duration of Discharge Encounter: Greater than 30 minutes including physician time.  Signed, Shirley Friar, PA-C  06/28/2019 1:30 PM   I have seen and examined this patient with Oda Kilts.  Agree with above, note added to reflect my findings.  On exam, RRR, no murmurs, lungs clear.  Patient presented to the hospital in atrial flutter.  During his hospital stay, he initially was going in and out of atrial flutter.  He was put on amiodarone which is converted him to sinus rhythm.  We Jorgeluis Gurganus plan to discharge on amiodarone as well as Eliquis.  Sakiya Stepka M. Dorlene Footman MD 06/28/2019 1:48 PM

## 2019-06-28 NOTE — Discharge Instructions (Signed)

## 2019-06-28 NOTE — Progress Notes (Signed)
.  Interactive audio and video telecommunications were attempted between this provider and patient, however failed, due to patient having technical difficulties OR patient did not have access to video capability.  We continued and completed visit with audio only.   Virtual Visit via Telephone Note  I connected with patient on 06/28/19  at 4:16 PM  by telephone and verified that I am speaking with the correct person using two identifiers.  Location of patient: home  Location of MD: [place] Name of referring provider (if blank then none associated): Names per persons and role in encounter:  MD: Earlyne Iba, Patient: name listed above.    I discussed the limitations, risks, security and privacy concerns of performing an evaluation and management service by telephone and the availability of in person appointments. I also discussed with the patient that there may be a patient responsible charge related to this service. The patient expressed understanding and agreed to proceed.  CC: inpatient f/u.    History of Present Illness:   Hospital Course d/w pt:  Pt started on amiodarone gtt and heparin. Pt continue to have paroxysms of SVTs with occasional atrial fibrillation on amio and heparin.  Rate control increased. Atrial tachycardia thought more likely than flutter without clear flutter waves. Given dose of lasix with BNP elevation.   Pt examined on 06/28/2019. He continue to have paroxysms of Atrial fibrillation with occasional SVTs. With intermittent periods of sinus, DCCV deferred.  Amiodarone transitioned to po and started on Eliquis. Coreg increased for additional rate control.  Pt feeling better and thought to be stable for discharge with very close follow up as below.   Pt will be discharged on amiodarone 400 mg BID x 1 week, then 200 mg BID.  ============== "I feel pretty good."  He can lay down w/o SOB now- that is improved.   No CP.  Compliant with medications.  Discussed  rationale for inpatient treatment and current medications. Reasonable to recheck follow-up labs later this week.  Discussed with patient.  Pandemic considerations discussed with patient.   Observations/Objective:  No apparent distress Speech normal  Assessment and Plan:   Shortness of breath, with tachycardia treated (and on medications) as listed.  Improved in the meantime.  Rationale for inpatient and outpatient treatment discussed with patient.  We will set up follow-up labs in the near future.  Routine cautions given to patient.  He agrees with plan.  This point still okay for outpatient follow-up.  No change in meds at this point.  Follow Up Instructions: See above  I discussed the assessment and treatment plan with the patient. The patient was provided an opportunity to ask questions and all were answered. The patient agreed with the plan and demonstrated an understanding of the instructions.   The patient was advised to call back or seek an in-person evaluation if the symptoms worsen or if the condition fails to improve as anticipated.  I provided 21 minutes of non-face-to-face time during this encounter.  Elsie Stain, MD

## 2019-07-01 ENCOUNTER — Telehealth: Payer: Self-pay | Admitting: Radiology

## 2019-07-01 ENCOUNTER — Encounter: Payer: Self-pay | Admitting: Family Medicine

## 2019-07-01 NOTE — Assessment & Plan Note (Signed)
With shortness of breath, with tachycardia treated (and on medications) as listed.  Improved in the meantime.  Rationale for inpatient and outpatient treatment discussed with patient.  We will set up follow-up labs in the near future.  Routine cautions given to patient.  He agrees with plan.  This point still okay for outpatient follow-up.  No change in meds at this point.

## 2019-07-01 NOTE — Telephone Encounter (Signed)
Dr Damita Dunnings would like the patient to come in Friday, 10.9 for a lab appt/ LVM

## 2019-07-03 ENCOUNTER — Telehealth: Payer: Self-pay | Admitting: Physician Assistant

## 2019-07-03 NOTE — Telephone Encounter (Signed)
Paged by answering service, patient is having itchiness all over his body and rash on his left arm.  He had a rash on his back during admission which resolved spontaneously.  Was started on amiodarone, diltiazem and Eliquis during admission.  I have advised to take Benadryl today and call us if no improvement.  Likely having reaction to 1 of this new medications.

## 2019-07-06 ENCOUNTER — Ambulatory Visit: Payer: Medicare Other

## 2019-07-06 ENCOUNTER — Ambulatory Visit (INDEPENDENT_AMBULATORY_CARE_PROVIDER_SITE_OTHER): Payer: Medicare Other

## 2019-07-06 ENCOUNTER — Other Ambulatory Visit (INDEPENDENT_AMBULATORY_CARE_PROVIDER_SITE_OTHER): Payer: Medicare Other

## 2019-07-06 DIAGNOSIS — I509 Heart failure, unspecified: Secondary | ICD-10-CM | POA: Diagnosis not present

## 2019-07-06 DIAGNOSIS — Z23 Encounter for immunization: Secondary | ICD-10-CM | POA: Diagnosis not present

## 2019-07-06 LAB — COMPREHENSIVE METABOLIC PANEL
ALT: 20 U/L (ref 0–53)
AST: 16 U/L (ref 0–37)
Albumin: 4 g/dL (ref 3.5–5.2)
Alkaline Phosphatase: 96 U/L (ref 39–117)
BUN: 22 mg/dL (ref 6–23)
CO2: 28 mEq/L (ref 19–32)
Calcium: 9.1 mg/dL (ref 8.4–10.5)
Chloride: 104 mEq/L (ref 96–112)
Creatinine, Ser: 2 mg/dL — ABNORMAL HIGH (ref 0.40–1.50)
GFR: 38.57 mL/min — ABNORMAL LOW (ref >60.00)
Glucose, Bld: 138 mg/dL — ABNORMAL HIGH (ref 70–99)
Potassium: 4.7 mEq/L (ref 3.5–5.1)
Sodium: 139 mEq/L (ref 135–145)
Total Bilirubin: 0.6 mg/dL (ref 0.2–1.2)
Total Protein: 6.3 g/dL (ref 6.0–8.3)

## 2019-07-06 LAB — CBC WITH DIFFERENTIAL/PLATELET
Basophils Absolute: 0 10*3/uL (ref 0.0–0.1)
Basophils Relative: 0.5 % (ref 0.0–3.0)
Eosinophils Absolute: 0.3 10*3/uL (ref 0.0–0.7)
Eosinophils Relative: 5.8 % — ABNORMAL HIGH (ref 0.0–5.0)
HCT: 37.6 % — ABNORMAL LOW (ref 39.0–52.0)
Hemoglobin: 12.5 g/dL — ABNORMAL LOW (ref 13.0–17.0)
Lymphocytes Relative: 31.9 % (ref 12.0–46.0)
Lymphs Abs: 1.4 10*3/uL (ref 0.7–4.0)
MCHC: 33.3 g/dL (ref 30.0–36.0)
MCV: 85 fl (ref 78.0–100.0)
Monocytes Absolute: 0.6 10*3/uL (ref 0.1–1.0)
Monocytes Relative: 13.2 % — ABNORMAL HIGH (ref 3.0–12.0)
Neutro Abs: 2.1 10*3/uL (ref 1.4–7.7)
Neutrophils Relative %: 48.6 % (ref 43.0–77.0)
Platelets: 192 10*3/uL (ref 150.0–400.0)
RBC: 4.42 Mil/uL (ref 4.22–5.81)
RDW: 13.5 % (ref 11.5–15.5)
WBC: 4.4 10*3/uL (ref 4.0–10.5)

## 2019-07-06 LAB — TSH: TSH: 7.68 u[IU]/mL — ABNORMAL HIGH (ref 0.35–4.50)

## 2019-07-06 LAB — BRAIN NATRIURETIC PEPTIDE: Pro B Natriuretic peptide (BNP): 223 pg/mL — ABNORMAL HIGH (ref 0.0–100.0)

## 2019-07-07 ENCOUNTER — Other Ambulatory Visit: Payer: Self-pay

## 2019-07-07 ENCOUNTER — Ambulatory Visit: Payer: Medicare Other | Admitting: Student

## 2019-07-07 VITALS — BP 140/66 | HR 62 | Ht 67.0 in | Wt 167.0 lb

## 2019-07-07 DIAGNOSIS — I5022 Chronic systolic (congestive) heart failure: Secondary | ICD-10-CM | POA: Diagnosis not present

## 2019-07-07 NOTE — Progress Notes (Signed)
PCP:  Tonia Ghent, MD Primary Cardiologist: Shelva Majestic, MD Electrophysiologist: None   John Hines is a 85 y.o. male with PMH of Atrial flutter on eliquis, chronic systolic CHF, CKD III, HLD, gout, HTN, and LBBBwho presents today for routine electrophysiology followup. They are seen for Dr Curt Bears. He was recently admitted with a new diagnosis of his Atrial flutter, and started on Eliquis and amiodarone.   Since discharge, he has felt much much better, nearing his normal.  Yesterday he was able to work in the yard and wash his car without difficulty. He is very happy today. He denies SOB, lightheadedness, or dizziness. He plans to get an annual eye exam soon.   The patient feels that he is tolerating medications without difficulties and is otherwise without complaint today.   Past Medical History:  Diagnosis Date  . Arthritis   . BPH (benign prostatic hyperplasia)   . CHF (congestive heart failure) ([place])    Nonischemic Cardiomyopathy. EF 25-35%  . Chronic kidney disease 05/20/2013   CKD 3  . Colon polyps   . Dyslipidemia   . Dysrhythmia 08/19/11   "low heart beat; maybe 25bpm; got RX; now 60bpm"  . ED (erectile dysfunction)   . Gout   . Heart murmur   . Hyperlipidemia   . Hypertension   . Low back pain 08/12/2013  . Nonfunctioning kidney Recognized 03/2006   Chronically and Severely Hydronephrotic and Nonfunctioning Right Kidney  . Solitary kidney Recognized 03/2006   Solitary Functioning Left Kidney  . Stroke [facility])    "mild stroke; dx'd 2010; don't know when it happened"  . Syncope and collapse 08/19/11   "shallow breathing; sweating horribly"   Past Surgical History:  Procedure Laterality Date  . CARDIOVASCULAR STRESS TEST  08/30/2010   Small to moderate fixed inferoseptal defect. Severe global hypokinesis with inferior akinesis and septal dyskinesis-likely secondary to a prominent LBBB. Non-diagnostic for ischemia due to persantine.  Marland Kitchen KNEE ARTHROSCOPY  1996   right  . TRANSTHORACIC ECHOCARDIOGRAM  08/20/2011   EF 45-50%, severe concentric hyperthrophy.    Current Outpatient Medications  Medication Sig Dispense Refill  . acetaminophen (TYLENOL) 500 MG tablet Take 500-1,000 mg by mouth every 8 (eight) hours as needed for mild pain or headache.     Marland Kitchen amiodarone (PACERONE) 200 MG tablet Take 2 tablets (400 mg total) twice daily for 1 week, then take 1 tablet (200 mg total) twice daily. 74 tablet 0  . amLODipine (NORVASC) 5 MG tablet TAKE 1 TABLET BY MOUTH EVERY DAY (Patient taking differently: Take 5 mg by mouth daily. ) 90 tablet 2  . apixaban (ELIQUIS) 2.5 MG TABS tablet Take 1 tablet (2.5 mg total) by mouth 2 (two) times daily. 60 tablet 3  . aspirin EC 81 MG tablet Take 81 mg by mouth at bedtime.     Marland Kitchen atorvastatin (LIPITOR) 80 MG tablet TAKE 1 TABLET BY MOUTH EVERY DAY (Patient taking differently: Take 80 mg by mouth at bedtime. ) 90 tablet 1  . benzonatate (TESSALON) 200 MG capsule Take 1 capsule (200 mg total) by mouth 3 (three) times daily as needed. 90 capsule 3  . carvedilol (COREG) 6.25 MG tablet Take 1 tablet (6.25 mg total) by mouth 2 (two) times daily with a meal. 180 tablet 3  . diltiazem (CARDIZEM) 60 MG tablet Take 1 tablet (60 mg total) by mouth every 8 (eight) hours. 120 tablet 3  . ELDERBERRY PO Take 1 tablet by mouth at bedtime.    Marland Kitchen  ENTRESTO 97-103 MG TAKE 1 TABLET BY MOUTH TWICE A DAY (Patient taking differently: Take 1 tablet by mouth 2 (two) times daily. ) 180 tablet 1  . ferrous sulfate 325 (65 FE) MG tablet TAKE 1 TABLET (325 MG TOTAL) BY MOUTH DAILY. (Patient taking differently: Take 325 mg by mouth at bedtime. ) 90 tablet 2  . Multiple Vitamins-Minerals (ONE-A-DAY VITACRAVES IMMUNITY) CHEW Chew 2 tablets by mouth 2 (two) times a week.     . Omega-3 Fatty Acids (FISH OIL) 1000 MG CAPS Take 1,000 mg by mouth 3 (three) times a week.     . traMADol (ULTRAM) 50 MG tablet Take 1 tablet (50 mg total) by mouth every 12 (twelve) hours  as needed. 30 tablet 0  . spironolactone (ALDACTONE) 25 MG tablet Take 0.5 tablets (12.5 mg total) by mouth daily. 45 tablet 3   No current facility-administered medications for this visit.     Allergies  Allergen Reactions  . Lisinopril Other (See Comments) and Cough    Possible history of cough on med.   . Nsaids Other (See Comments)    Avoid due to renal status  . Penicillins Swelling and Rash    Has patient had a PCN reaction causing immediate rash, facial/tongue/throat swelling, SOB or lightheadedness with hypotension: Yes- chest swells Has patient had a PCN reaction causing severe rash involving mucus membranes or skin necrosis: No Has patient had a PCN reaction that required hospitalization: No Has patient had a PCN reaction occurring within the last 10 years: No If all of the above answers are "NO", then may proceed with Cephalosporin use.     Social History   Socioeconomic History  . Marital status: Widowed    Spouse name: Not on file  . Number of children: Not on file  . Years of education: Not on file  . Highest education level: Not on file  Occupational History  . Not on file  Social Needs  . Financial resource strain: Not on file  . Food insecurity    Worry: Not on file    Inability: Not on file  . Transportation needs    Medical: Not on file    Non-medical: Not on file  Tobacco Use  . Smoking status: Never Smoker  . Smokeless tobacco: Never Used  Substance and Sexual Activity  . Alcohol use: Yes  . Drug use: No  . Sexual activity: Yes  Lifestyle  . Physical activity    Days per week: Not on file    Minutes per session: Not on file  . Stress: Not on file  Relationships  . Social Herbalist on phone: Not on file    Gets together: Not on file    Attends religious service: Not on file    Active member of club or organization: Not on file    Attends meetings of clubs or organizations: Not on file    Relationship status: Not on file  .  Intimate partner violence    Fear of current or ex partner: Not on file    Emotionally abused: Not on file    Physically abused: Not on file    Forced sexual activity: Not on file  Other Topics Concern  . Not on file  Social History Narrative   Grew up in [place].  His mother died in childbirth when he was an infant.   Widowed 2019.  Was previously married for 59 years.   He works Corporate treasurer  for [employer].     Review of Systems: General: No chills, fever, night sweats or weight changes  Cardiovascular:  No chest pain, dyspnea on exertion, edema, orthopnea, palpitations, paroxysmal nocturnal dyspnea Dermatological: No rash, lesions or masses Respiratory: No cough, dyspnea Urologic: No hematuria, dysuria Abdominal: No nausea, vomiting, diarrhea, bright red blood per rectum, melena, or hematemesis Neurologic: No visual changes, weakness, changes in mental status All other systems reviewed and are otherwise negative except as noted above.  Physical Exam: Vitals:   07/07/19 1331  BP: 140/66  Pulse: 62  Weight: 167 lb (75.8 kg)  Height: 5\' 7"  (1.702 m)    GEN- The patient is well appearing, alert and oriented x 3 today.   HEENT: normocephalic, atraumatic; sclera clear, conjunctiva pink; hearing intact; oropharynx clear; neck supple, no JVP Lymph- no cervical lymphadenopathy Lungs- Clear to ausculation bilaterally, normal work of breathing.  No wheezes, rales, rhonchi Heart- Regular rate and rhythm, no murmurs, rubs or gallops, PMI not laterally displaced GI- soft, non-tender, non-distended, bowel sounds present, no hepatosplenomegaly Extremities- no clubbing, cyanosis, or edema; DP/PT/radial pulses 2+ bilaterally MS- no significant deformity or atrophy Skin- warm and dry, no rash or lesion Psych- euthymic mood, full affect Neuro- strength and sensation are intact  EKG is ordered today. Personal review shows NSR at 62 bpm.   Assessment and Plan:   1. Paroxysmal atrial fibrillation/flutter Continue dose appropriate Eliquis 2.5 mg BID for CHA2DS2VASC of 4 Continue amiodarone 200 mg BID for now. This is his best AAD option given age and co morbidities.   Labwork yesterday with elevated TSH. He recently decreased dose of amiodarone, so from our perspective will just follow for now Continue coreg 6.25 mg BID He was started on low dose Diltiazem 60 mg TID while in the hospital. Will discuss with Dr. Curt Bears, suspect he should come off of this with low EF.  Will plan on stopping this an increasing coreg.   2. Chronic systolic CHF, NICM NYHA II symptoms now in NSR, volume status stable. Continue current regiment Watch kidney function. Low threshold to decrease Entrestro and spironolactone risk may eventually outweigh benefit.  3. CKD III-IV He follows with nephrology. Previously saw Dr. Lorrene Reid.  RTC 6 weeks for follow up.   Shirley Friar, PA-C  07/07/19 1:37 PM

## 2019-07-07 NOTE — Patient Instructions (Signed)
Medication Instructions:   Your physician recommends that you continue on your current medications as directed. Please refer to the Current Medication list given to you today.  If you need a refill on your cardiac medications before your next appointment, please call your pharmacy.   Lab work:  NONE ORDERED  TODAY   If you have labs (blood work) drawn today and your tests are completely normal, you will receive your results only by: Marland Kitchen MyChart Message (if you have MyChart) OR . A paper copy in the mail If you have any lab test that is abnormal or we need to change your treatment, we will call you to review the results.  Testing/Procedures: NONE ORDERED  TODAY    Follow-Up: At [facility], you and your health needs are our priority.  As part of our continuing mission to provide you with exceptional heart care, we have created designated Provider Care Teams.  These Care Teams include your primary Cardiologist (physician) and Advanced Practice Providers (APPs -  Physician Assistants and Nurse Practitioners) who all work together to provide you with the care you need, when you need it. You will need a follow up a Please call our office You may see None or one of the following Advanced Practice Providers on your designated Care Team:  Joesph July PA-C   Any Other Special Instructions Will Be Listed Below (If Applicable).

## 2019-07-08 ENCOUNTER — Other Ambulatory Visit: Payer: Self-pay | Admitting: *Deleted

## 2019-07-08 ENCOUNTER — Telehealth: Payer: Self-pay | Admitting: Student

## 2019-07-08 MED ORDER — CARVEDILOL 12.5 MG PO TABS: 12.5000 mg | ORAL_TABLET | Freq: Two times a day (BID) | ORAL | 3 refills | Status: DC

## 2019-07-08 NOTE — Telephone Encounter (Signed)
Pt notified and voiced understanding 

## 2019-07-08 NOTE — Addendum Note (Signed)
Addended by: Levonne Hubert on: 07/08/2019 09:20 AM   Modules accepted: Orders

## 2019-07-08 NOTE — Progress Notes (Signed)
Diltiazem stopped

## 2019-07-08 NOTE — Telephone Encounter (Signed)
Reviewed case with Dr. Curt Bears.   He agreed we should STOP Diltiazem at this point as this is contraindicated for long term use in patients who have chronic systolic heart failure.   Can we please increase his coreg to 12.5 mg BID to make up the difference?  Please ask him to call and let us know if he is getting lightheadedness or dizziness. We would consider further med adjustment at that time.   Thank you.  Legrand Como [address] [place], PA-C  07/08/2019 8:56 AM

## 2019-07-09 ENCOUNTER — Other Ambulatory Visit: Payer: Self-pay | Admitting: Family Medicine

## 2019-07-09 DIAGNOSIS — R7989 Other specified abnormal findings of blood chemistry: Secondary | ICD-10-CM

## 2019-07-09 NOTE — Telephone Encounter (Signed)
Follow Up  Patient's daughter is calling in to go over medication changes. Please give patient's daughter a call back to discuss.

## 2019-07-11 ENCOUNTER — Emergency Department (HOSPITAL_COMMUNITY): Payer: Medicare Other

## 2019-07-11 ENCOUNTER — Other Ambulatory Visit: Payer: Self-pay

## 2019-07-11 ENCOUNTER — Emergency Department (HOSPITAL_COMMUNITY)
Admission: EM | Admit: 2019-07-11 | Discharge: 2019-07-11 | Disposition: A | Discharge status: Home / Self Care | Payer: Medicare Other | Attending: Emergency Medicine | Admitting: Emergency Medicine

## 2019-07-11 DIAGNOSIS — Z7901 Long term (current) use of anticoagulants: Secondary | ICD-10-CM | POA: Diagnosis not present

## 2019-07-11 DIAGNOSIS — N183 Chronic kidney disease, stage 3 unspecified: Secondary | ICD-10-CM | POA: Insufficient documentation

## 2019-07-11 DIAGNOSIS — Z79899 Other long term (current) drug therapy: Secondary | ICD-10-CM | POA: Insufficient documentation

## 2019-07-11 DIAGNOSIS — Z7982 Long term (current) use of aspirin: Secondary | ICD-10-CM | POA: Insufficient documentation

## 2019-07-11 DIAGNOSIS — I5022 Chronic systolic (congestive) heart failure: Secondary | ICD-10-CM | POA: Diagnosis not present

## 2019-07-11 DIAGNOSIS — N179 Acute kidney failure, unspecified: Secondary | ICD-10-CM | POA: Insufficient documentation

## 2019-07-11 DIAGNOSIS — R55 Syncope and collapse: Secondary | ICD-10-CM | POA: Insufficient documentation

## 2019-07-11 DIAGNOSIS — I130 Hypertensive heart and chronic kidney disease with heart failure and stage 1 through stage 4 chronic kidney disease, or unspecified chronic kidney disease: Secondary | ICD-10-CM | POA: Diagnosis not present

## 2019-07-11 LAB — COMPREHENSIVE METABOLIC PANEL
ALT: 38 U/L (ref 0–44)
AST: 22 U/L (ref 15–41)
Albumin: 3.7 g/dL (ref 3.5–5.0)
Alkaline Phosphatase: 89 U/L (ref 38–126)
Anion gap: 12 (ref 5–15)
BUN: 25 mg/dL — ABNORMAL HIGH (ref 8–23)
CO2: 22 mmol/L (ref 22–32)
Calcium: 9.3 mg/dL (ref 8.9–10.3)
Chloride: 107 mmol/L (ref 98–111)
Creatinine, Ser: 2.46 mg/dL — ABNORMAL HIGH (ref 0.61–1.24)
GFR calc Af Amer: 27 mL/min — ABNORMAL LOW (ref >60)
GFR calc non Af Amer: 23 mL/min — ABNORMAL LOW (ref >60)
Glucose, Bld: 156 mg/dL — ABNORMAL HIGH (ref 70–99)
Potassium: 5 mmol/L (ref 3.5–5.1)
Sodium: 141 mmol/L (ref 135–145)
Total Bilirubin: 0.4 mg/dL (ref 0.3–1.2)
Total Protein: 6.6 g/dL (ref 6.5–8.1)

## 2019-07-11 LAB — CBC WITH DIFFERENTIAL/PLATELET
Abs Immature Granulocytes: 0.03 10*3/uL (ref 0.00–0.07)
Basophils Absolute: 0 10*3/uL (ref 0.0–0.1)
Basophils Relative: 0 %
Eosinophils Absolute: 0.1 10*3/uL (ref 0.0–0.5)
Eosinophils Relative: 2 %
HCT: 41.5 % (ref 39.0–52.0)
Hemoglobin: 13.2 g/dL (ref 13.0–17.0)
Immature Granulocytes: 1 %
Lymphocytes Relative: 19 %
Lymphs Abs: 1 10*3/uL (ref 0.7–4.0)
MCH: 28.1 pg (ref 26.0–34.0)
MCHC: 31.8 g/dL (ref 30.0–36.0)
MCV: 88.5 fL (ref 80.0–100.0)
Monocytes Absolute: 0.5 10*3/uL (ref 0.1–1.0)
Monocytes Relative: 9 %
Neutro Abs: 3.7 10*3/uL (ref 1.7–7.7)
Neutrophils Relative %: 69 %
Platelets: 205 10*3/uL (ref 150–400)
RBC: 4.69 MIL/uL (ref 4.22–5.81)
RDW: 12.9 % (ref 11.5–15.5)
WBC: 5.4 10*3/uL (ref 4.0–10.5)
nRBC: 0 % (ref 0.0–0.2)

## 2019-07-11 LAB — TROPONIN I (HIGH SENSITIVITY)
Troponin I (High Sensitivity): 12 ng/L (ref <18)
Troponin I (High Sensitivity): 10 ng/L (ref <18)

## 2019-07-11 LAB — BRAIN NATRIURETIC PEPTIDE: B Natriuretic Peptide: 173.8 pg/mL — ABNORMAL HIGH (ref 0.0–100.0)

## 2019-07-11 MED ORDER — LACTATED RINGERS IV BOLUS
500.0000 mL | Freq: Once | INTRAVENOUS | Status: AC
  Administered 2019-07-11: 17:00:00 500 mL via INTRAVENOUS

## 2019-07-11 NOTE — ED Provider Notes (Signed)
[place] EMERGENCY DEPARTMENT Provider Note   CSN: [id number] Arrival date & time: 07/11/19  1508     History   Chief Complaint Chief Complaint  Patient presents with  . Near Syncope    HPI John Hines is a 85 y.o. male.     HPI  The patient is an 85 year old male with a history of BPH, CHF (nonischemic cardiomyopathy, EF 25-30%), CKD stage 3, HLD, ED, HTN, CVA, recent hospitalization and diagnosis of atrial fibrillation and SVT who presents to the ED with near syncope and diaphoresis.   The patient states that he was working outside with his son sawing wood when he suddenly became diaphoretic and lightheaded. No chest pain or shortness of breath or heart palpitations. He states that his vision became slightly dark and he had to sit down. No syncope but he felt as if he was going to pass out. These symptoms lasted for around 15 minutes. He drank water and then subsequently had 3-4 episodes of NBNB emesis. He then called for EMS and was transported to the [facility] ED for further evaluation. No recent fevers or chills. No sick contacts.   On arrival to the ED, his symptoms had resolved.  On chart review, the patient was hospitalized from 10/2-10/5 for SOB and fatigue for 2 weeks and was found to have SVT and paroxysmal atrial fibrillation. He was placed on Amiodarone and heparin and discharged on Eliquis. He was also discharged on Coreg 6.25mg  BID for rate control. He is on Amiodarone 200mg  BID. He was seen in clinic with Cardiology on 10/14 and his Coreg was increased to 12.5mg  BID.   Past Medical History:  Diagnosis Date  . Arthritis   . BPH (benign prostatic hyperplasia)   . CHF (congestive heart failure) ([place])    Nonischemic Cardiomyopathy. EF 25-35%  . Chronic kidney disease 05/20/2013   CKD 3  . Colon polyps   . Dyslipidemia   . Dysrhythmia 08/19/11   "low heart beat; maybe 25bpm; got RX; now 60bpm"  . ED (erectile dysfunction)   . Gout   . Heart  murmur   . Hyperlipidemia   . Hypertension   . Low back pain 08/12/2013  . Nonfunctioning kidney Recognized 03/2006   Chronically and Severely Hydronephrotic and Nonfunctioning Right Kidney  . Solitary kidney Recognized 03/2006   Solitary Functioning Left Kidney  . Stroke [facility])    "mild stroke; dx'd 2010; don't know when it happened"  . Syncope and collapse 08/19/11   "shallow breathing; sweating horribly"    Patient Active Problem List   Diagnosis Date Noted  . Abnormal TSH 07/09/2019  . Atrial flutter ([place]) 06/26/2019  . Advance care planning 10/05/2018  . URI (upper respiratory infection) 10/05/2018  . Iron deficiency anemia 08/07/2017  . BPH (benign prostatic hyperplasia) 03/21/2016  . Lower back pain 08/12/2013  . Eczema 08/12/2013  . Nonfunctioning kidney   . CHF (congestive heart failure) ([place])   . Chronic systolic heart failure ([place]) 03/11/2013  . Gout 03/11/2013  . Syncope 08/21/2011  . Chronic kidney disease, stage III (moderate) 08/21/2011  . Hyperglycemia 08/21/2011  . Left bundle branch block 08/21/2011  . Hyperlipidemia   . Hypertension   . Dyslipidemia     Past Surgical History:  Procedure Laterality Date  . CARDIOVASCULAR STRESS TEST  08/30/2010   Small to moderate fixed inferoseptal defect. Severe global hypokinesis with inferior akinesis and septal dyskinesis-likely secondary to a prominent LBBB. Non-diagnostic for ischemia due  to persantine.  Marland Kitchen KNEE ARTHROSCOPY  1996   right  . TRANSTHORACIC ECHOCARDIOGRAM  08/20/2011   EF 45-50%, severe concentric hyperthrophy.        Home Medications    Prior to Admission medications   Medication Sig Start Date End Date Taking? Authorizing Provider  acetaminophen (TYLENOL) 500 MG tablet Take 500-1,000 mg by mouth every 8 (eight) hours as needed for mild pain or headache.     [provider]  amiodarone (PACERONE) 200 MG tablet Take 2 tablets (400 mg total) twice daily for 1 week, then take 1 tablet  (200 mg total) twice daily. 06/28/19   Shirley Friar, PA-C  amLODipine (NORVASC) 5 MG tablet TAKE 1 TABLET BY MOUTH EVERY DAY Patient taking differently: Take 5 mg by mouth daily.  03/16/19   Troy Sine, MD  apixaban (ELIQUIS) 2.5 MG TABS tablet Take 1 tablet (2.5 mg total) by mouth 2 (two) times daily. 06/28/19   Shirley Friar, PA-C  aspirin EC 81 MG tablet Take 81 mg by mouth at bedtime.     [provider]  atorvastatin (LIPITOR) 80 MG tablet TAKE 1 TABLET BY MOUTH EVERY DAY Patient taking differently: Take 80 mg by mouth at bedtime.  05/04/19   Troy Sine, MD  benzonatate (TESSALON) 200 MG capsule Take 1 capsule (200 mg total) by mouth 3 (three) times daily as needed. 01/10/19   Tonia Ghent, MD  carvedilol (COREG) 12.5 MG tablet Take 1 tablet (12.5 mg total) by mouth 2 (two) times daily. 07/08/19 10/06/19  Shirley Friar, PA-C  ELDERBERRY PO Take 1 tablet by mouth at bedtime.    [provider]  ENTRESTO 97-103 MG TAKE 1 TABLET BY MOUTH TWICE A DAY Patient taking differently: Take 1 tablet by mouth 2 (two) times daily.  12/21/18   Troy Sine, MD  ferrous sulfate 325 (65 FE) MG tablet TAKE 1 TABLET (325 MG TOTAL) BY MOUTH DAILY. Patient taking differently: Take 325 mg by mouth at bedtime.  11/27/18   Tonia Ghent, MD  Multiple Vitamins-Minerals (ONE-A-DAY VITACRAVES IMMUNITY) CHEW Chew 2 tablets by mouth 2 (two) times a week.     [provider]  Omega-3 Fatty Acids (FISH OIL) 1000 MG CAPS Take 1,000 mg by mouth 3 (three) times a week.     [provider]  spironolactone (ALDACTONE) 25 MG tablet Take 0.5 tablets (12.5 mg total) by mouth daily. 01/08/19 06/25/19  Troy Sine, MD  traMADol (ULTRAM) 50 MG tablet Take 1 tablet (50 mg total) by mouth every 12 (twelve) hours as needed. 02/25/19   Tonia Ghent, MD    Family History Family History  Problem Relation Age of Onset  . Early death Mother        died in  child birth  . Stomach cancer Father        stomach    Social History Social History   Tobacco Use  . Smoking status: Never Smoker  . Smokeless tobacco: Never Used  Substance Use Topics  . Alcohol use: Yes  . Drug use: No     Allergies   Lisinopril, Nsaids, and Penicillins   Review of Systems Review of Systems  Constitutional: Negative for chills and fever.  Eyes: Negative for pain and visual disturbance.  Respiratory: Negative for cough and shortness of breath.   Cardiovascular: Negative for chest pain and palpitations.  Gastrointestinal: Positive for nausea and vomiting. Negative for abdominal pain.  Genitourinary:  Negative for dysuria and hematuria.  Musculoskeletal: Negative for arthralgias and back pain.  Skin: Negative for color change and rash.  Neurological: Positive for light-headedness. Negative for seizures and syncope.  All other systems reviewed and are negative.    Physical Exam Updated Vital Signs BP (!) 127/56   Pulse 68   Temp 98.1 F (36.7 C) (Oral)   Resp 19   SpO2 97%   Physical Exam Vitals signs and nursing note reviewed.  Constitutional:      Appearance: He is well-developed.  HENT:     Head: Normocephalic and atraumatic.  Eyes:     Conjunctiva/sclera: Conjunctivae normal.  Neck:     Musculoskeletal: Neck supple.  Cardiovascular:     Rate and Rhythm: Normal rate and regular rhythm.     Heart sounds: No murmur.  Pulmonary:     Effort: Pulmonary effort is normal. No respiratory distress.     Breath sounds: Normal breath sounds.  Abdominal:     Palpations: Abdomen is soft.     Tenderness: There is no abdominal tenderness.  Skin:    General: Skin is warm and dry.  Neurological:     Mental Status: He is alert.      ED Treatments / Results  Labs (all labs ordered are listed, but only abnormal results are displayed) Labs Reviewed  COMPREHENSIVE METABOLIC PANEL - Abnormal; Notable for the following components:      Result  Value   Glucose, Bld 156 (*)    BUN 25 (*)    Creatinine, Ser 2.46 (*)    GFR calc non Af Amer 23 (*)    GFR calc Af Amer 27 (*)    All other components within normal limits  BRAIN NATRIURETIC PEPTIDE - Abnormal; Notable for the following components:   B Natriuretic Peptide 173.8 (*)    All other components within normal limits  CBC WITH DIFFERENTIAL/PLATELET  TROPONIN I (HIGH SENSITIVITY)  TROPONIN I (HIGH SENSITIVITY)    EKG EKG Interpretation  Date/Time:  Sunday July 11 2019 15:11:45 EDT Ventricular Rate:  64 PR Interval:    QRS Duration: 168 QT Interval:  496 QTC Calculation: 512 R Axis:   -73 Text Interpretation:  Sinus rhythm Left bundle branch block No significant change since last tracing Confirmed by Wandra Arthurs ([id number]) on 07/11/2019 3:14:24 PM   Radiology Dg Chest Port 1 View  Result Date: 07/11/2019 CLINICAL DATA:  Near syncope EXAM: PORTABLE CHEST 1 VIEW COMPARISON:  June 25, 2019 FINDINGS: The heart size and mediastinal contours are within normal limits. Both lungs are clear. The visualized skeletal structures are unremarkable. IMPRESSION: No active disease. Electronically Signed   By: Dorise Bullion III M.D   On: 07/11/2019 16:10    Procedures Procedures (including critical care time)  Medications Ordered in ED Medications  lactated ringers bolus 500 mL (0 mLs Intravenous Stopped 07/11/19 1845)     Initial Impression / Assessment and Plan / ED Course  I have reviewed the triage vital signs and the nursing notes.  Pertinent labs & imaging results that were available during my care of the patient were reviewed by me and considered in my medical decision making (see chart for details).  Clinical Course as of Jul 10 2121  Sun Jul 11, 2019  1700 B Natriuretic Peptide(!): 173.8 [JL]  1701 Creatinine(!): 2.46 [JL]  1703 BUN(!): 25 [JL]    Clinical Course User Index [JL] Regan Lemming, MD       The patient is  an 85 year old male with a  history of BPH, CHF (nonischemic cardiomyopathy, EF 25-30%), CKD stage 3, HLD, ED, HTN, CVA, recent hospitalization and diagnosis of atrial fibrillation and SVT who presents to the ED with near syncope and diaphoresis after exerting himself.  The patient's dose of Carvedilol was recently increased in clinic with Cardiology to 12.5mg  BID. He denies chest pain, shortness of breath, or heart palpitations. He states that he feels better and is back to his baseline.   Ddx: Most likely near exertional near-syncope in the setting of BB use, additional consideration given to vasovagal syncope, less likely arrhythmia.   EKG: NSR, LBBB present that is not old. No ischemic changes and no changes compared to prior.  CXR: Unremarkable  Labs: Slight elevation in his creatinine to 2.46 from a baseline of 2. BUN mildly elevated to 25. troponins x2 negative.  The patient remained asymptomatic in the ED. Orthostatics were unremarkable. He received a gentle IVF bolus of 500cc for his elevated Cr. He was tolerating PO and ambulatory in the ED. Feel that his symptoms were brought on by over exertion in the setting of BB use. No ectopy or evidence of arrhythmia on telemetry. Well appearing and stable for discharge. Advised that he follow-up with his PCP and provided return precautions.   Final Clinical Impressions(s) / ED Diagnoses   Final diagnoses:  Near syncope  AKI (acute kidney injury) [facility])    ED Discharge Orders    None       Regan Lemming, MD 07/11/19 2126    Drenda Freeze, MD 07/13/19 [phone]

## 2019-07-11 NOTE — ED Triage Notes (Signed)
Patient in via John Hines from home following near syncopal episode - was outside working for a while, became lightheaded and diaphoretic, tunneled vision - went inside and began vomiting. Reports he feels a lot better now, denies chest pain or shortness of breath. Significant cardiac history, recently admitted for dysrhythmia and had his carvedilol dose increased. A&O x 4. EMS VS: 94 palp initially, last BP 104/69, HR 60, temp 62F.

## 2019-07-11 NOTE — Discharge Instructions (Addendum)
Please continue to hydrate orally. Please follow-up with your primary physician in 3 days for a recheck.Your workup today was reassuring. Please limit activities that cause significant exertion until you can be re-evaluated and cleared by your PCP.

## 2019-07-12 NOTE — Telephone Encounter (Signed)
Spoke with daughter on Friday and daughter voiced understanding

## 2019-07-23 ENCOUNTER — Telehealth: Payer: Self-pay | Admitting: Cardiology

## 2019-07-23 DIAGNOSIS — I10 Essential (primary) hypertension: Secondary | ICD-10-CM

## 2019-07-23 DIAGNOSIS — Z79899 Other long term (current) drug therapy: Secondary | ICD-10-CM

## 2019-07-23 NOTE — Telephone Encounter (Signed)
New Message   Patients daughter is calling on behalf of her father in reference to issues that he has been having with his HR.   STAT if HR is under 50 or over 120 (normal HR is 60-100 beats per minute)  1) What is your heart rate? 52  2) Do you have a log of your heart rate readings (document readings)? 52, 62  3) Do you have any other symptoms? Earlier today feeling fatigue and his vision was blurry. Right now he feels okay but they are concerned because this happened a few weeks ago. But the last time he was vomiting.

## 2019-07-23 NOTE — Telephone Encounter (Signed)
Called and spoke to patient's daughter (DPR on file). She states that the patient had an episode earlier today where he was out in in the yard working and he felt lightheaded, dizzy, and his vision got blurry. Denies syncope or N/V. She states that this was not as severe as his previous episode for which he went to the ER for on 10/18. She states that his HR is 52-68. Does not have BP reading. He is taking amiodarone 200 mg BID and carvedilol 12.5 mg BID. He did stop the diltiazem. Patient is not having any Sx at this time. Patient will continue to monitor and let us know if his Sx return or worsen. Will forward to Oda Kilts, [place] for review.

## 2019-07-26 NOTE — Telephone Encounter (Signed)
I spoke to the patient with Andy's recommendation.  He will come in on 11/3 for BMET.

## 2019-07-26 NOTE — Telephone Encounter (Signed)
Continue to monitor symptoms. He needs a BMET early this week, please.    Seen in ED with AKI 10/18.  No change to spiro or Entresto, need to make sure his Cr has improved.   Legrand Como [address]" [place], PA-C  07/26/2019 8:30 AM

## 2019-07-27 ENCOUNTER — Other Ambulatory Visit: Payer: Medicare Other

## 2019-07-27 ENCOUNTER — Encounter (INDEPENDENT_AMBULATORY_CARE_PROVIDER_SITE_OTHER): Payer: Self-pay

## 2019-07-27 ENCOUNTER — Other Ambulatory Visit: Payer: Self-pay

## 2019-07-27 DIAGNOSIS — I10 Essential (primary) hypertension: Secondary | ICD-10-CM

## 2019-07-27 DIAGNOSIS — Z79899 Other long term (current) drug therapy: Secondary | ICD-10-CM

## 2019-07-27 LAB — BASIC METABOLIC PANEL
BUN/Creatinine Ratio: 10 (ref 10–24)
BUN: 19 mg/dL (ref 8–27)
CO2: 24 mmol/L (ref 20–29)
Calcium: 9.2 mg/dL (ref 8.6–10.2)
Chloride: 106 mmol/L (ref 96–106)
Creatinine, Ser: 1.88 mg/dL — ABNORMAL HIGH (ref 0.76–1.27)
GFR calc Af Amer: 37 mL/min/{1.73_m2} — ABNORMAL LOW (ref >59)
GFR calc non Af Amer: 32 mL/min/{1.73_m2} — ABNORMAL LOW (ref >59)
Glucose: 113 mg/dL — ABNORMAL HIGH (ref 65–99)
Potassium: 4.8 mmol/L (ref 3.5–5.2)
Sodium: 140 mmol/L (ref 134–144)

## 2019-08-17 NOTE — Progress Notes (Signed)
PCP:  Tonia Ghent, MD Primary Cardiologist: Shelva Majestic, MD Electrophysiologist: Dr. Thereasa Distance John Hines is a 85 y.o. male with past medical history of atrial flutter on eliquis, chronic systolic CHF, CKD III, HLD, gout, HTN, and LBBB who presents today for routine electrophysiology followup. They are seen for Dr. Curt Bears.   Since last being seen in our clinic, the patient reports doing well.    His daughter is on the line, and says he has had some "fogginess".  They also report his Dr. Lorrene Reid (nephrology) wanted to make some changes to his medications, but aren't sure which.  He has mild nausea in the mornings, and occasional mild HA. Denies bleeding on eliquis.   BP elevated on arrival, but he did not take any medications yet this am.   Past Medical History:  Diagnosis Date  . Arthritis   . BPH (benign prostatic hyperplasia)   . CHF (congestive heart failure) ([place])    Nonischemic Cardiomyopathy. EF 25-35%  . Chronic kidney disease 05/20/2013   CKD 3  . Colon polyps   . Dyslipidemia   . Dysrhythmia 08/19/11   "low heart beat; maybe 25bpm; got RX; now 60bpm"  . ED (erectile dysfunction)   . Gout   . Heart murmur   . Hyperlipidemia   . Hypertension   . Low back pain 08/12/2013  . Nonfunctioning kidney Recognized 03/2006   Chronically and Severely Hydronephrotic and Nonfunctioning Right Kidney  . Solitary kidney Recognized 03/2006   Solitary Functioning Left Kidney  . Stroke [facility])    "mild stroke; dx'd 2010; don't know when it happened"  . Syncope and collapse 08/19/11   "shallow breathing; sweating horribly"   Past Surgical History:  Procedure Laterality Date  . CARDIOVASCULAR STRESS TEST  08/30/2010   Small to moderate fixed inferoseptal defect. Severe global hypokinesis with inferior akinesis and septal dyskinesis-likely secondary to a prominent LBBB. Non-diagnostic for ischemia due to persantine.  Marland Kitchen KNEE ARTHROSCOPY  1996   right  . TRANSTHORACIC  ECHOCARDIOGRAM  08/20/2011   EF 45-50%, severe concentric hyperthrophy.    Current Outpatient Medications  Medication Sig Dispense Refill  . acetaminophen (TYLENOL) 500 MG tablet Take 500-1,000 mg by mouth every 8 (eight) hours as needed for mild pain or headache.     Marland Kitchen amiodarone (PACERONE) 200 MG tablet Take 2 tablets (400 mg total) twice daily for 1 week, then take 1 tablet (200 mg total) twice daily. 74 tablet 0  . amLODipine (NORVASC) 5 MG tablet TAKE 1 TABLET BY MOUTH EVERY DAY (Patient taking differently: Take 5 mg by mouth daily. ) 90 tablet 2  . apixaban (ELIQUIS) 2.5 MG TABS tablet Take 1 tablet (2.5 mg total) by mouth 2 (two) times daily. 60 tablet 3  . aspirin EC 81 MG tablet Take 81 mg by mouth at bedtime.     Marland Kitchen atorvastatin (LIPITOR) 80 MG tablet TAKE 1 TABLET BY MOUTH EVERY DAY (Patient taking differently: Take 80 mg by mouth at bedtime. ) 90 tablet 1  . benzonatate (TESSALON) 200 MG capsule Take 1 capsule (200 mg total) by mouth 3 (three) times daily as needed. 90 capsule 3  . carvedilol (COREG) 12.5 MG tablet Take 1 tablet (12.5 mg total) by mouth 2 (two) times daily. 180 tablet 3  . ELDERBERRY PO Take 1 tablet by mouth at bedtime.    Marland Kitchen ENTRESTO 97-103 MG TAKE 1 TABLET BY MOUTH TWICE A DAY (Patient taking differently: Take 1 tablet by mouth 2 (  two) times daily. ) 180 tablet 1  . ferrous sulfate 325 (65 FE) MG tablet TAKE 1 TABLET (325 MG TOTAL) BY MOUTH DAILY. (Patient taking differently: Take 325 mg by mouth at bedtime. ) 90 tablet 2  . Multiple Vitamins-Minerals (ONE-A-DAY VITACRAVES IMMUNITY) CHEW Chew 2 tablets by mouth 2 (two) times a week.     . Omega-3 Fatty Acids (FISH OIL) 1000 MG CAPS Take 1,000 mg by mouth 3 (three) times a week.     . spironolactone (ALDACTONE) 25 MG tablet Take 0.5 tablets (12.5 mg total) by mouth daily. 45 tablet 3  . traMADol (ULTRAM) 50 MG tablet Take 1 tablet (50 mg total) by mouth every 12 (twelve) hours as needed. 30 tablet 0   No current  facility-administered medications for this visit.     Allergies  Allergen Reactions  . Lisinopril Other (See Comments) and Cough    Possible history of cough on med.   . Nsaids Other (See Comments)    Avoid due to renal status  . Penicillins Swelling and Rash    Has patient had a PCN reaction causing immediate rash, facial/tongue/throat swelling, SOB or lightheadedness with hypotension: Yes- chest swells Has patient had a PCN reaction causing severe rash involving mucus membranes or skin necrosis: No Has patient had a PCN reaction that required hospitalization: No Has patient had a PCN reaction occurring within the last 10 years: No If all of the above answers are "NO", then may proceed with Cephalosporin use.     Social History   Socioeconomic History  . Marital status: Widowed    Spouse name: Not on file  . Number of children: Not on file  . Years of education: Not on file  . Highest education level: Not on file  Occupational History  . Not on file  Social Needs  . Financial resource strain: Not on file  . Food insecurity    Worry: Not on file    Inability: Not on file  . Transportation needs    Medical: Not on file    Non-medical: Not on file  Tobacco Use  . Smoking status: Never Smoker  . Smokeless tobacco: Never Used  Substance and Sexual Activity  . Alcohol use: Yes  . Drug use: No  . Sexual activity: Yes  Lifestyle  . Physical activity    Days per week: Not on file    Minutes per session: Not on file  . Stress: Not on file  Relationships  . Social Herbalist on phone: Not on file    Gets together: Not on file    Attends religious service: Not on file    Active member of club or organization: Not on file    Attends meetings of clubs or organizations: Not on file    Relationship status: Not on file  . Intimate partner violence    Fear of current or ex partner: Not on file    Emotionally abused: Not on file    Physically abused: Not on file     Forced sexual activity: Not on file  Other Topics Concern  . Not on file  Social History Narrative   Grew up in [place].  His mother died in childbirth when he was an infant.   Widowed 2019.  Was previously married for 66 years.   He works Corporate treasurer for [employer].     Review of Systems: General: No chills, fever, night sweats or weight changes  Cardiovascular:  No chest pain, dyspnea on exertion, edema, orthopnea, palpitations, paroxysmal nocturnal dyspnea Dermatological: No rash, lesions or masses Respiratory: No cough, dyspnea Urologic: No hematuria, dysuria Abdominal: No nausea, vomiting, diarrhea, bright red blood per rectum, melena, or hematemesis Neurologic: No visual changes, weakness, changes in mental status All other systems reviewed and are otherwise negative except as noted above.  Physical Exam: Vitals:   08/18/19 1150  BP: (!) 160/62  Pulse: 61  Weight: 167 lb (75.8 kg)  Height: 5\' 7"  (1.702 m)    GEN- The patient is well appearing, alert and oriented x 3 today.   HEENT: normocephalic, atraumatic; sclera clear, conjunctiva pink; hearing intact; oropharynx clear; neck supple, no JVP Lymph- no cervical lymphadenopathy Lungs- Clear to ausculation bilaterally, normal work of breathing.  No wheezes, rales, rhonchi Heart- Regular rate and rhythm, no murmurs, rubs or gallops, PMI not laterally displaced GI- soft, non-tender, non-distended, bowel sounds present, no hepatosplenomegaly Extremities- no clubbing, cyanosis, or edema; DP/PT/radial pulses 2+ bilaterally MS- no significant deformity or atrophy Skin- warm and dry, no rash or lesion Psych- euthymic mood, full affect Neuro- strength and sensation are intact  EKG is not ordered. Personal review of EKG from 07/12/2019 shows NSR  Assessment and Plan:  1. Paroxysmal atrial fibrillation/flutter Continue Eliquis 2.5 mg BID for CHA2DS2VASC of 4   Decrease amiodoarone to 200 mg daily.  surveillance labs today.  Continue coreg 12.5 mg BID.   2. Chronic systolic CHF Echo 42/8768 showed LVEF 30-35% NYHA II-III symptoms Volume status stable on exam.  On appropriate regimen. May need to decrease/change [facility] pending kidney function.  Have previously discussed ICD. Overall stable symptoms, and with age and co-morbidities unlikely to benefit at this time.   3. CKD III-IV Follows with nephrology Will check labs today, and then reach out to Dr. Lorrene Reid for recommendations for his HTN medications; specifically Entresto.  Will likely need to switch to imdur/hydral if renal function worsens/remains elevated.   Shirley Friar, PA-C  08/18/19 11:59 AM

## 2019-08-18 ENCOUNTER — Other Ambulatory Visit: Payer: Self-pay

## 2019-08-18 ENCOUNTER — Ambulatory Visit: Payer: Medicare Other | Admitting: Student

## 2019-08-18 VITALS — BP 160/62 | HR 61 | Ht 67.0 in | Wt 167.0 lb

## 2019-08-18 DIAGNOSIS — N184 Chronic kidney disease, stage 4 (severe): Secondary | ICD-10-CM | POA: Diagnosis not present

## 2019-08-18 DIAGNOSIS — I5022 Chronic systolic (congestive) heart failure: Secondary | ICD-10-CM | POA: Diagnosis not present

## 2019-08-18 DIAGNOSIS — I480 Paroxysmal atrial fibrillation: Secondary | ICD-10-CM

## 2019-08-18 MED ORDER — AMIODARONE HCL 200 MG PO TABS: 200.0000 mg | ORAL_TABLET | Freq: Every day | ORAL | 3 refills | Status: DC

## 2019-08-18 NOTE — Patient Instructions (Addendum)
Medication Instructions:   START TAKING : AMIODARONE 200 MG ONCE A DAY   *If you need a refill on your cardiac medications before your next appointment, please call your pharmacy*  Lab Work:  TODAY CBC CMET AND TSH   If you have labs (blood work) drawn today and your tests are completely normal, you will receive your results only by: Marland Kitchen MyChart Message (if you have MyChart) OR . A paper copy in the mail If you have any lab test that is abnormal or we need to change your treatment, we will call you to review the results.  Testing/Procedures: NONE ORDERED  TODAY   Follow-Up: At [facility], you and your health needs are our priority.  As part of our continuing mission to provide you with exceptional heart care, we have created designated Provider Care Teams.  These Care Teams include your primary Cardiologist (physician) and Advanced Practice Providers (APPs -  Physician Assistants and Nurse Practitioners) who all work together to provide you with the care you need, when you need it.  Your next appointment:   3 month(s)  The format for your next appointment:   In Person  Provider:  Legrand Como "Oda Kil[place], PA-C   Other Instructions

## 2019-08-19 LAB — COMPREHENSIVE METABOLIC PANEL
ALT: 26 IU/L (ref 0–44)
AST: 18 IU/L (ref 0–40)
Albumin/Globulin Ratio: 1.8 (ref 1.2–2.2)
Albumin: 4.2 g/dL (ref 3.6–4.6)
Alkaline Phosphatase: 115 IU/L (ref 39–117)
BUN/Creatinine Ratio: 13 (ref 10–24)
BUN: 22 mg/dL (ref 8–27)
Bilirubin Total: 0.4 mg/dL (ref 0.0–1.2)
CO2: 23 mmol/L (ref 20–29)
Calcium: 9.6 mg/dL (ref 8.6–10.2)
Chloride: 105 mmol/L (ref 96–106)
Creatinine, Ser: 1.63 mg/dL — ABNORMAL HIGH (ref 0.76–1.27)
GFR calc Af Amer: 44 mL/min/{1.73_m2} — ABNORMAL LOW (ref >59)
GFR calc non Af Amer: 38 mL/min/{1.73_m2} — ABNORMAL LOW (ref >59)
Globulin, Total: 2.4 g/dL (ref 1.5–4.5)
Glucose: 93 mg/dL (ref 65–99)
Potassium: 4.8 mmol/L (ref 3.5–5.2)
Sodium: 141 mmol/L (ref 134–144)
Total Protein: 6.6 g/dL (ref 6.0–8.5)

## 2019-08-19 LAB — CBC
Hematocrit: 37.2 % — ABNORMAL LOW (ref 37.5–51.0)
Hemoglobin: 12.5 g/dL — ABNORMAL LOW (ref 13.0–17.7)
MCH: 27.5 pg (ref 26.6–33.0)
MCHC: 33.6 g/dL (ref 31.5–35.7)
MCV: 82 fL (ref 79–97)
Platelets: 225 10*3/uL (ref 150–450)
RBC: 4.55 x10E6/uL (ref 4.14–5.80)
RDW: 13.6 % (ref 11.6–15.4)
WBC: 4.3 10*3/uL (ref 3.4–10.8)

## 2019-08-19 LAB — TSH: TSH: 7.15 u[IU]/mL — ABNORMAL HIGH (ref 0.450–4.500)

## 2019-08-25 ENCOUNTER — Other Ambulatory Visit: Payer: Self-pay | Admitting: *Deleted

## 2019-08-25 DIAGNOSIS — I5022 Chronic systolic (congestive) heart failure: Secondary | ICD-10-CM

## 2019-08-27 ENCOUNTER — Other Ambulatory Visit: Payer: Self-pay

## 2019-08-27 ENCOUNTER — Other Ambulatory Visit: Payer: Medicare Other | Admitting: *Deleted

## 2019-08-27 DIAGNOSIS — I5022 Chronic systolic (congestive) heart failure: Secondary | ICD-10-CM

## 2019-08-27 LAB — T3, FREE: T3, Free: 2.2 pg/mL (ref 2.0–4.4)

## 2019-08-27 LAB — TSH: TSH: 7.29 u[IU]/mL — ABNORMAL HIGH (ref 0.450–4.500)

## 2019-08-27 LAB — T4, FREE: Free T4: 1.36 ng/dL (ref 0.82–1.77)

## 2019-08-30 ENCOUNTER — Telehealth: Payer: Self-pay | Admitting: Cardiovascular Disease

## 2019-08-30 NOTE — Telephone Encounter (Signed)
New message   Patient and patient's daughter is calling in to get recent lab results faxed over to the patient's kidney doctor.    Dr. [facility] Fax #: [phone]

## 2019-08-30 NOTE — Telephone Encounter (Signed)
Will send message to medical records.

## 2019-09-08 ENCOUNTER — Other Ambulatory Visit: Payer: Self-pay | Admitting: Cardiovascular Disease

## 2019-09-14 ENCOUNTER — Ambulatory Visit: Payer: Medicare Other | Attending: Internal Medicine

## 2019-09-14 DIAGNOSIS — Z20822 Contact with and (suspected) exposure to covid-19: Secondary | ICD-10-CM

## 2019-09-15 LAB — NOVEL CORONAVIRUS, NAA: SARS-CoV-2, NAA: NOT DETECTED

## 2019-09-16 ENCOUNTER — Telehealth: Payer: Self-pay | Admitting: Family Medicine

## 2019-09-16 NOTE — Telephone Encounter (Signed)
Pt calling to get COVID results.  Made him aware those are negative.

## 2019-09-20 ENCOUNTER — Other Ambulatory Visit: Payer: Medicare Other

## 2019-09-20 ENCOUNTER — Telehealth: Payer: Self-pay | Admitting: Family Medicine

## 2019-09-20 ENCOUNTER — Other Ambulatory Visit: Payer: Self-pay

## 2019-09-20 ENCOUNTER — Telehealth: Payer: Self-pay | Admitting: Cardiology

## 2019-09-20 NOTE — Telephone Encounter (Signed)
Patient's Daughter called  They are requesting a work note for the patient stating that he is at higher risk for contracting covid

## 2019-09-20 NOTE — Telephone Encounter (Signed)
I will send note to both primary card nurse as well as EP.

## 2019-09-20 NOTE — Telephone Encounter (Signed)
New message    Patient requesting letter stating he is at higher risks for getting [phone]

## 2019-09-20 NOTE — Telephone Encounter (Signed)
Spoke with pt daughter, attempted to determine purpose for letter.  She stated he wants a letter for their records that the patient has a heart condition.  Informed need more specific information as we cannot write a letter with a blanket statement that they have a cardiac condition.  This is information that patient already has with his post visit summaries.

## 2019-09-21 ENCOUNTER — Encounter: Payer: Self-pay | Admitting: Family Medicine

## 2019-09-21 LAB — LAB REPORT - SCANNED
Albumin: 4.4
Creatinine, Ser: 2.25
Glucose: 94
Potassium: 5.5
Sodium: 141

## 2019-09-21 NOTE — Telephone Encounter (Signed)
Daughter advised.  Note left at front desk for pickup.

## 2019-09-21 NOTE — Telephone Encounter (Signed)
Done. Thanks.

## 2019-09-24 ENCOUNTER — Other Ambulatory Visit: Payer: Self-pay | Admitting: Family Medicine

## 2019-09-25 NOTE — Telephone Encounter (Signed)
agree

## 2019-09-28 ENCOUNTER — Telehealth: Payer: Self-pay

## 2019-09-28 NOTE — Telephone Encounter (Signed)
Chase Picket (not positive on the name) Nurse practitioner, left message stating that she did house call visit with patient and had PAD screening done. Result showed moderate reading of the left leg for PAD. Patient is asymptomatic and nurse practitioner just wanted to report this. If you have any questions for her CB is [phone].

## 2019-09-29 ENCOUNTER — Other Ambulatory Visit: Payer: Self-pay

## 2019-09-29 MED ORDER — CARVEDILOL 12.5 MG PO TABS: 12.5000 mg | ORAL_TABLET | Freq: Two times a day (BID) | ORAL | 3 refills | Status: DC

## 2019-09-29 MED ORDER — ENTRESTO 97-103 MG PO TABS: 1.0000 | ORAL_TABLET | Freq: Two times a day (BID) | ORAL | 3 refills | Status: DC

## 2019-09-29 MED ORDER — ATORVASTATIN CALCIUM 80 MG PO TABS: 80.0000 mg | ORAL_TABLET | Freq: Every day | ORAL | 3 refills | Status: DC

## 2019-09-29 NOTE — Telephone Encounter (Signed)
Thanks

## 2019-09-29 NOTE — Telephone Encounter (Signed)
Duly noted.  Please triage patient.  See if he is having any chest pain or claudication.  Let me know if he has any skin breakdown on his legs or poorly healing areas.  It would be reasonable to get the patient checked in the office when possible, given the Covid pandemic.  Please schedule when feasible.  Thanks.

## 2019-09-29 NOTE — Telephone Encounter (Signed)
I spoke with pt; no CP no pain in lt leg, no swelling,redness or warmth to either leg. Pt has no covid symptoms, no travel and no known exposure to + covid. UC & ED precautions given and pt voiced understanding. Pt scheduled in office appt with Dr Damita Dunnings on 09/30/18 at 11:30. FYI to Dr Damita Dunnings.

## 2019-09-30 ENCOUNTER — Other Ambulatory Visit: Payer: Self-pay

## 2019-09-30 MED ORDER — APIXABAN 2.5 MG PO TABS: 2.5000 mg | ORAL_TABLET | Freq: Two times a day (BID) | ORAL | 1 refills | Status: DC

## 2019-09-30 NOTE — Telephone Encounter (Signed)
Requested Prescriptions   Pending Prescriptions Disp Refills  . amiodarone (PACERONE) 200 MG tablet 90 tablet 3    Sig: Take 1 tablet (200 mg total) by mouth daily.  Marland Kitchen amLODipine (NORVASC) 5 MG tablet 90 tablet 2    Sig: Take 1 tablet (5 mg total) by mouth daily.   Please send refill to [facility]

## 2019-10-01 ENCOUNTER — Other Ambulatory Visit: Payer: Self-pay

## 2019-10-01 MED ORDER — CARVEDILOL 12.5 MG PO TABS: 12.5000 mg | ORAL_TABLET | Freq: Two times a day (BID) | ORAL | 3 refills | Status: DC

## 2019-10-01 MED ORDER — ENTRESTO 97-103 MG PO TABS: 1.0000 | ORAL_TABLET | Freq: Two times a day (BID) | ORAL | 3 refills | Status: DC

## 2019-10-01 MED ORDER — AMLODIPINE BESYLATE 5 MG PO TABS: 5.0000 mg | ORAL_TABLET | Freq: Every day | ORAL | 3 refills | Status: DC

## 2019-10-01 MED ORDER — SPIRONOLACTONE 25 MG PO TABS: 12.5000 mg | ORAL_TABLET | Freq: Every day | ORAL | 3 refills | Status: DC

## 2019-10-01 MED ORDER — AMIODARONE HCL 200 MG PO TABS: 200.0000 mg | ORAL_TABLET | Freq: Every day | ORAL | 3 refills | Status: DC

## 2019-10-01 MED ORDER — ATORVASTATIN CALCIUM 80 MG PO TABS: 80.0000 mg | ORAL_TABLET | Freq: Every day | ORAL | 3 refills | Status: DC

## 2019-10-05 ENCOUNTER — Encounter: Payer: Self-pay | Admitting: Family Medicine

## 2019-10-05 ENCOUNTER — Ambulatory Visit (INDEPENDENT_AMBULATORY_CARE_PROVIDER_SITE_OTHER): Payer: Medicare Other | Admitting: Family Medicine

## 2019-10-05 ENCOUNTER — Other Ambulatory Visit: Payer: Self-pay

## 2019-10-05 DIAGNOSIS — I10 Essential (primary) hypertension: Secondary | ICD-10-CM

## 2019-10-05 NOTE — Progress Notes (Signed)
This visit occurred during the SARS-CoV-2 public health emergency.  Safety protocols were in place, including screening questions prior to the visit, additional usage of staff PPE, and extensive cleaning of exam room while observing appropriate contact time as indicated for disinfecting solutions.  Home eval done prev, by nurse with his insurance company.  He has longstanding h/o R>L leg muscle mass at baseline.  This wasn't new for patient.  Reported that he has abnormal ABI on L leg.  No leg cramping.  No claudication.  No BLE edema.  No CP.  Here for evaluation.  History of hypertension/CHF noted.  Meds, vitals, and allergies reviewed.   ROS: Per HPI unless specifically indicated in ROS section   GEN: nad, alert and oriented HEENT: ncat NECK: supple w/o LA CV: rrr.   PULM: ctab, no inc wob ABD: soft, +bs EXT: no edema SKIN: Well-perfused. Bilateral calf circumference 32 cm, equal.  No edema. He has symmetric normal and easily palpable dorsalis pedis pulses.

## 2019-10-05 NOTE — Patient Instructions (Addendum)
[email or web address]  The pulses in your feet are normal.  If you have any skin changes, a spot that doesn't heal, or a cramping with walking then let me know.   I'll update cardiology in the meantime.   Take care.  Glad to see you.

## 2019-10-06 NOTE — Assessment & Plan Note (Signed)
History of hypertension/CHF.  He does not have bilateral lower extremity edema.  He has no claudication and he has normal, equal, easily palpable dorsalis pedis pulses.  I do not think it makes sense to send him for invasive testing at this point.  Patient agrees.  If he has any claudication then he will let me know.  I will update cardiology as FYI in the meantime.

## 2019-10-19 ENCOUNTER — Telehealth: Payer: Self-pay

## 2019-10-19 NOTE — Telephone Encounter (Signed)
John Hines (DPR signed) pts daughter said pt had been told he could not eat peanuts; pt has heart and BP issues. Pt wants to now if he can eat raw peanuts out of the shell with no salt or should pt not eat peanuts at all. John Hines request cb. Pt last visit 10/05/2019.

## 2019-10-19 NOTE — Telephone Encounter (Signed)
John Hines (daughter) advised.

## 2019-10-19 NOTE — Telephone Encounter (Signed)
Assuming he doesn't have a peanut allergy, then should be okay to eat without salt. Thanks.

## 2019-10-27 ENCOUNTER — Other Ambulatory Visit (HOSPITAL_COMMUNITY): Payer: Self-pay

## 2019-10-27 MED ORDER — APIXABAN 2.5 MG PO TABS: 2.5000 mg | ORAL_TABLET | Freq: Two times a day (BID) | ORAL | 1 refills | Status: DC

## 2019-10-27 NOTE — Telephone Encounter (Signed)
Pt last saw Barrington Ellison, [place] on 08/18/19, last labs 08/18/19 Creat 1.63, age 86, weight 77.2kg, based on specified criteria pt is on appropriate dosage of Eliquis 2.5mg  BID.  Will refill rx.

## 2019-11-23 ENCOUNTER — Ambulatory Visit: Payer: Medicare Other | Admitting: Student

## 2019-11-23 NOTE — Progress Notes (Deleted)
PCP:  Tonia Ghent, MD Primary Cardiologist: Shelva Majestic, MD Electrophysiologist: Dr. Thereasa Distance John Hines is a 86 y.o. male with past medical history of atrial flutter on eliquis, chronic systolic CHF, CKD III, HLD, gout, HTN, and LBBB who presents today for routine electrophysiology followup. They are seen for Dr. Curt Bears.   Since last being seen in our clinic, the patient reports doing very well.    The patient feels that he is tolerating medications without difficulties and is otherwise without complaint today.   Past Medical History:  Diagnosis Date  . Arthritis   . BPH (benign prostatic hyperplasia)   . CHF (congestive heart failure) ([place])    Nonischemic Cardiomyopathy. EF 25-35%  . Chronic kidney disease 05/20/2013   CKD 3  . Colon polyps   . Dyslipidemia   . Dysrhythmia 08/19/11   "low heart beat; maybe 25bpm; got RX; now 60bpm"  . ED (erectile dysfunction)   . Gout   . Heart murmur   . Hyperlipidemia   . Hypertension   . Low back pain 08/12/2013  . Nonfunctioning kidney Recognized 03/2006   Chronically and Severely Hydronephrotic and Nonfunctioning Right Kidney  . Solitary kidney Recognized 03/2006   Solitary Functioning Left Kidney  . Stroke [facility], [place])    "mild stroke; dx'd 2010; don't know when it happened"  . Syncope and collapse 08/19/11   "shallow breathing; sweating horribly"   Past Surgical History:  Procedure Laterality Date  . CARDIOVASCULAR STRESS TEST  08/30/2010   Small to moderate fixed inferoseptal defect. Severe global hypokinesis with inferior akinesis and septal dyskinesis-likely secondary to a prominent LBBB. Non-diagnostic for ischemia due to persantine.  Marland Kitchen KNEE ARTHROSCOPY  1996   right  . TRANSTHORACIC ECHOCARDIOGRAM  08/20/2011   EF 45-50%, severe concentric hyperthrophy.    Current Outpatient Medications  Medication Sig Dispense Refill  . acetaminophen (TYLENOL) 500 MG tablet Take 500-1,000 mg by mouth every 8 (eight) hours as needed  for mild pain or headache.     Marland Kitchen amiodarone (PACERONE) 200 MG tablet Take 1 tablet (200 mg total) by mouth daily. 90 tablet 3  . amLODipine (NORVASC) 5 MG tablet Take 1 tablet (5 mg total) by mouth daily. 90 tablet 3  . apixaban (ELIQUIS) 2.5 MG TABS tablet Take 1 tablet (2.5 mg total) by mouth 2 (two) times daily. 180 tablet 1  . aspirin EC 81 MG tablet Take 81 mg by mouth at bedtime.     Marland Kitchen atorvastatin (LIPITOR) 80 MG tablet Take 1 tablet (80 mg total) by mouth daily. 90 tablet 3  . benzonatate (TESSALON) 200 MG capsule Take 1 capsule (200 mg total) by mouth 3 (three) times daily as needed. 90 capsule 3  . carvedilol (COREG) 12.5 MG tablet Take 1 tablet (12.5 mg total) by mouth 2 (two) times daily. 180 tablet 3  . ELDERBERRY PO Take 1 tablet by mouth at bedtime.    . ferrous sulfate 325 (65 FE) MG tablet TAKE 1 TABLET BY MOUTH EVERY DAY 90 tablet 2  . Multiple Vitamins-Minerals (ONE-A-DAY VITACRAVES IMMUNITY) CHEW Chew 2 tablets by mouth 2 (two) times a week.     . Omega-3 Fatty Acids (FISH OIL) 1000 MG CAPS Take 1,000 mg by mouth 3 (three) times a week.     . sacubitril-valsartan (ENTRESTO) 97-103 MG Take 1 tablet by mouth 2 (two) times daily. 180 tablet 3  . spironolactone (ALDACTONE) 25 MG tablet Take 0.5 tablets (12.5 mg total) by mouth daily. [place]  tablet 3  . traMADol (ULTRAM) 50 MG tablet Take 1 tablet (50 mg total) by mouth every 12 (twelve) hours as needed. 30 tablet 0   No current facility-administered medications for this visit.    Allergies  Allergen Reactions  . Lisinopril Other (See Comments) and Cough    Possible history of cough on med.   . Nsaids Other (See Comments)    Avoid due to renal status  . Penicillins Swelling and Rash    Has patient had a PCN reaction causing immediate rash, facial/tongue/throat swelling, SOB or lightheadedness with hypotension: Yes- chest swells Has patient had a PCN reaction causing severe rash involving mucus membranes or skin necrosis: No Has  patient had a PCN reaction that required hospitalization: No Has patient had a PCN reaction occurring within the last 10 years: No If all of the above answers are "NO", then may proceed with Cephalosporin use.     Social History   Socioeconomic History  . Marital status: Widowed    Spouse name: Not on file  . Number of children: Not on file  . Years of education: Not on file  . Highest education level: Not on file  Occupational History  . Not on file  Tobacco Use  . Smoking status: Never Smoker  . Smokeless tobacco: Never Used  Substance and Sexual Activity  . Alcohol use: Yes  . Drug use: No  . Sexual activity: Yes  Other Topics Concern  . Not on file  Social History Narrative   Grew up in [place].  His mother died in childbirth when he was an infant.   Widowed 2019.  Was previously married for 106 years.   He works Corporate treasurer for [employer].   Social Determinants of Health   Financial Resource Strain:   . Difficulty of Paying Living Expenses: Not on file  Food Insecurity:   . Worried About Charity fundraiser in the Last Year: Not on file  . Ran Out of Food in the Last Year: Not on file  Transportation Needs:   . Lack of Transportation (Medical): Not on file  . Lack of Transportation (Non-Medical): Not on file  Physical Activity:   . Days of Exercise per Week: Not on file  . Minutes of Exercise per Session: Not on file  Stress:   . Feeling of Stress : Not on file  Social Connections:   . Frequency of Communication with Friends and Family: Not on file  . Frequency of Social Gatherings with Friends and Family: Not on file  . Attends Religious Services: Not on file  . Active Member of Clubs or Organizations: Not on file  . Attends Archivist Meetings: Not on file  . Marital Status: Not on file  Intimate Partner Violence:   . Fear of Current or Ex-Partner: Not on file  . Emotionally Abused: Not on file  . Physically Abused:  Not on file  . Sexually Abused: Not on file     Review of Systems: General: No chills, fever, night sweats or weight changes  Cardiovascular:  No chest pain, dyspnea on exertion, edema, orthopnea, palpitations, paroxysmal nocturnal dyspnea Dermatological: No rash, lesions or masses Respiratory: No cough, dyspnea Urologic: No hematuria, dysuria Abdominal: No nausea, vomiting, diarrhea, bright red blood per rectum, melena, or hematemesis Neurologic: No visual changes, weakness, changes in mental status All other systems reviewed and are otherwise negative except as noted above.  Physical Exam: There were no vitals filed for  this visit.  GEN- The patient is well appearing, alert and oriented x 3 today.   HEENT: normocephalic, atraumatic; sclera clear, conjunctiva pink; hearing intact; oropharynx clear; neck supple, no JVP Lymph- no cervical lymphadenopathy Lungs- Clear to ausculation bilaterally, normal work of breathing.  No wheezes, rales, rhonchi Heart- Regular rate and rhythm, no murmurs, rubs or gallops, PMI not laterally displaced GI- soft, non-tender, non-distended, bowel sounds present, no hepatosplenomegaly Extremities- no clubbing, cyanosis, or edema; DP/PT/radial pulses 2+ bilaterally MS- no significant deformity or atrophy Skin- warm and dry, no rash or lesion Psych- euthymic mood, full affect Neuro- strength and sensation are intact  EKG is ordered. Personal review of EKG from today shows ***  Assessment and Plan:  1. Paroxysmal atrial fibrillation/flutter Continue appropriately dosed Eliquis at 2.5 mg BID for CHA2DS2VASC of at least 4   Continue amiodarone 200 mg daily for now.  Continue coreg 12.5 mg BID.   2. Chronic systolic CHF Echo [id number]/7473 showed LVEF 30-35% NYHA *** symptoms  Volume status *** Stable on appropriate regimen.  Labs today.  Have previously discussed ICD. Overall stable symptoms, and with age and co-morbidities unlikely to benefit at this  time.   3. CKD III-IV Follows with nephrology Follow renal function closely on max dose Entresto.   RTC to see Dr. Curt Bears 6 months. Sooner with symptoms.   Shirley Friar, PA-C  11/23/19 8:13 AM

## 2020-01-29 ENCOUNTER — Other Ambulatory Visit: Payer: Self-pay | Admitting: Cardiovascular Disease

## 2020-03-08 ENCOUNTER — Other Ambulatory Visit: Payer: Self-pay | Admitting: Cardiology

## 2020-03-09 NOTE — Telephone Encounter (Signed)
Eliquis 2.5mg  refill request received. Patient is 86 years old, weight-77.2kg, Crea-1.63 on 08/18/2019, Diagnosis-Afib, and last seen by Oda Kilts on 08/18/2019. Dose is appropriate based on dosing criteria. Will send in refill to requested pharmacy.

## 2020-05-02 ENCOUNTER — Other Ambulatory Visit: Payer: Self-pay

## 2020-05-02 ENCOUNTER — Ambulatory Visit (INDEPENDENT_AMBULATORY_CARE_PROVIDER_SITE_OTHER): Payer: Medicare Other

## 2020-05-02 DIAGNOSIS — Z0000 Encounter for general adult medical examination without abnormal findings: Secondary | ICD-10-CM

## 2020-05-02 NOTE — Progress Notes (Signed)
PCP notes:  Health Maintenance: Tdap- insurance/financial Flu- due   Abnormal Screenings: none   Patient concerns: none   Nurse concerns: none   Next PCP appt.: none

## 2020-05-02 NOTE — Patient Instructions (Signed)
John Hines , Thank you for taking time to come for your Medicare Wellness Visit. I appreciate your ongoing commitment to your health goals. Please review the following plan we discussed and let me know if I can assist you in the future.   Screening recommendations/referrals: Colonoscopy: no longer required Recommended yearly ophthalmology/optometry visit for glaucoma screening and checkup Recommended yearly dental visit for hygiene and checkup  Vaccinations: Influenza vaccine: due, will be available in the office at the end of August Pneumococcal vaccine: Completed series Tdap vaccine: decline- insurance/financial Shingles vaccine: due, check with your insurance regarding coverage   Covid-19: Completed series  Advanced directives: Advance directive discussed with you today. Even though you declined this today please call our office should you change your mind and we can give you the proper paperwork for you to fill out.  Conditions/risks identified: hypertension, hyperlipidemia  Next appointment: Follow up in one year for your annual wellness visit.   Preventive Care 77 Years and Older, Male Preventive care refers to lifestyle choices and visits with your health care provider that can promote health and wellness. What does preventive care include?  A yearly physical exam. This is also called an annual well check.  Dental exams once or twice a year.  Routine eye exams. Ask your health care provider how often you should have your eyes checked.  Personal lifestyle choices, including:  Daily care of your teeth and gums.  Regular physical activity.  Eating a healthy diet.  Avoiding tobacco and drug use.  Limiting alcohol use.  Practicing safe sex.  Taking low doses of aspirin every day.  Taking vitamin and mineral supplements as recommended by your health care provider. What happens during an annual well check? The services and screenings done by your health care provider  during your annual well check will depend on your age, overall health, lifestyle risk factors, and family history of disease. Counseling  Your health care provider may ask you questions about your:  Alcohol use.  Tobacco use.  Drug use.  Emotional well-being.  Home and relationship well-being.  Sexual activity.  Eating habits.  History of falls.  Memory and ability to understand (cognition).  Work and work Statistician. Screening  You may have the following tests or measurements:  Height, weight, and BMI.  Blood pressure.  Lipid and cholesterol levels. These may be checked every 5 years, or more frequently if you are over 57 years old.  Skin check.  Lung cancer screening. You may have this screening every year starting at age 30 if you have a 30-pack-year history of smoking and currently smoke or have quit within the past 15 years.  Fecal occult blood test (FOBT) of the stool. You may have this test every year starting at age 5.  Flexible sigmoidoscopy or colonoscopy. You may have a sigmoidoscopy every 5 years or a colonoscopy every 10 years starting at age 35.  Prostate cancer screening. Recommendations will vary depending on your family history and other risks.  Hepatitis C blood test.  Hepatitis B blood test.  Sexually transmitted disease (STD) testing.  Diabetes screening. This is done by checking your blood sugar (glucose) after you have not eaten for a while (fasting). You may have this done every 1-3 years.  Abdominal aortic aneurysm (AAA) screening. You may need this if you are a current or former smoker.  Osteoporosis. You may be screened starting at age 9 if you are at high risk. Talk with your health care provider about your  test results, treatment options, and if necessary, the need for more tests. Vaccines  Your health care provider may recommend certain vaccines, such as:  Influenza vaccine. This is recommended every year.  Tetanus,  diphtheria, and acellular pertussis (Tdap, Td) vaccine. You may need a Td booster every 10 years.  Zoster vaccine. You may need this after age 63.  Pneumococcal 13-valent conjugate (PCV13) vaccine. One dose is recommended after age 66.  Pneumococcal polysaccharide (PPSV23) vaccine. One dose is recommended after age 18. Talk to your health care provider about which screenings and vaccines you need and how often you need them. This information is not intended to replace advice given to you by your health care provider. Make sure you discuss any questions you have with your health care provider. Document Released: 10/06/2015 Document Revised: 05/29/2016 Document Reviewed: 07/11/2015 Elsevier Interactive Patient Education  2017 [place] Prevention in the Home Falls can cause injuries. They can happen to people of all ages. There are many things you can do to make your home safe and to help prevent falls. What can I do on the outside of my home?  Regularly fix the edges of walkways and driveways and fix any cracks.  Remove anything that might make you trip as you walk through a door, such as a raised step or threshold.  Trim any bushes or trees on the path to your home.  Use bright outdoor lighting.  Clear any walking paths of anything that might make someone trip, such as rocks or tools.  Regularly check to see if handrails are loose or broken. Make sure that both sides of any steps have handrails.  Any raised decks and porches should have guardrails on the edges.  Have any leaves, snow, or ice cleared regularly.  Use sand or salt on walking paths during winter.  Clean up any spills in your garage right away. This includes oil or grease spills. What can I do in the bathroom?  Use night lights.  Install grab bars by the toilet and in the tub and shower. Do not use towel bars as grab bars.  Use non-skid mats or decals in the tub or shower.  If you need to sit down in  the shower, use a plastic, non-slip stool.  Keep the floor dry. Clean up any water that spills on the floor as soon as it happens.  Remove soap buildup in the tub or shower regularly.  Attach bath mats securely with double-sided non-slip rug tape.  Do not have throw rugs and other things on the floor that can make you trip. What can I do in the bedroom?  Use night lights.  Make sure that you have a light by your bed that is easy to reach.  Do not use any sheets or blankets that are too big for your bed. They should not hang down onto the floor.  Have a firm chair that has side arms. You can use this for support while you get dressed.  Do not have throw rugs and other things on the floor that can make you trip. What can I do in the kitchen?  Clean up any spills right away.  Avoid walking on wet floors.  Keep items that you use a lot in easy-to-reach places.  If you need to reach something above you, use a strong step stool that has a grab bar.  Keep electrical cords out of the way.  Do not use floor polish or wax that  makes floors slippery. If you must use wax, use non-skid floor wax.  Do not have throw rugs and other things on the floor that can make you trip. What can I do with my stairs?  Do not leave any items on the stairs.  Make sure that there are handrails on both sides of the stairs and use them. Fix handrails that are broken or loose. Make sure that handrails are as long as the stairways.  Check any carpeting to make sure that it is firmly attached to the stairs. Fix any carpet that is loose or worn.  Avoid having throw rugs at the top or bottom of the stairs. If you do have throw rugs, attach them to the floor with carpet tape.  Make sure that you have a light switch at the top of the stairs and the bottom of the stairs. If you do not have them, ask someone to add them for you. What else can I do to help prevent falls?  Wear shoes that:  Do not have high  heels.  Have rubber bottoms.  Are comfortable and fit you well.  Are closed at the toe. Do not wear sandals.  If you use a stepladder:  Make sure that it is fully opened. Do not climb a closed stepladder.  Make sure that both sides of the stepladder are locked into place.  Ask someone to hold it for you, if possible.  Clearly mark and make sure that you can see:  Any grab bars or handrails.  First and last steps.  Where the edge of each step is.  Use tools that help you move around (mobility aids) if they are needed. These include:  Canes.  Walkers.  Scooters.  Crutches.  Turn on the lights when you go into a dark area. Replace any light bulbs as soon as they burn out.  Set up your furniture so you have a clear path. Avoid moving your furniture around.  If any of your floors are uneven, fix them.  If there are any pets around you, be aware of where they are.  Review your medicines with your doctor. Some medicines can make you feel dizzy. This can increase your chance of falling. Ask your doctor what other things that you can do to help prevent falls. This information is not intended to replace advice given to you by your health care provider. Make sure you discuss any questions you have with your health care provider. Document Released: 07/06/2009 Document Revised: 02/15/2016 Document Reviewed: 10/14/2014 Elsevier Interactive Patient Education  2017 [employer]..

## 2020-05-02 NOTE — Progress Notes (Signed)
Subjective:   John Hines is a 86 y.o. male who presents for Medicare Annual/Subsequent preventive examination.  Review of Systems: N/A      I connected with the patient today by telephone and verified that I am speaking with the correct person using two identifiers. Location patient: home Location nurse: work Persons participating in the virtual visit: patient, Marine scientist.   I discussed the limitations, risks, security and privacy concerns of performing an evaluation and management service by telephone and the availability of in person appointments. I also discussed with the patient that there may be a patient responsible charge related to this service. The patient expressed understanding and verbally consented to this telephonic visit.    Interactive audio and video telecommunications were attempted between this nurse and patient, however failed, due to patient having technical difficulties OR patient did not have access to video capability.  We continued and completed visit with audio only.     Cardiac Risk Factors include: advanced age (>36men, >50 women);hypertension;dyslipidemia;male gender     Objective:    Today's Vitals   There is no height or weight on file to calculate BMI.  Advanced Directives 05/02/2020 07/11/2019 06/27/2019 06/25/2019 05/12/2019 05/09/2019 06/17/2018  Does Patient Have a Medical Advance Directive? No No No No No No No  Would patient like information on creating a medical advance directive? No - Patient declined No - Patient declined No - Patient declined - No - Patient declined No - Patient declined No - Patient declined    Current Medications (verified) Outpatient Encounter Medications as of 05/02/2020  Medication Sig  . acetaminophen (TYLENOL) 500 MG tablet Take 500-1,000 mg by mouth every 8 (eight) hours as needed for mild pain or headache.   Marland Kitchen amiodarone (PACERONE) 200 MG tablet Take 1 tablet (200 mg total) by mouth daily.  Marland Kitchen amLODipine (NORVASC) 5  MG tablet Take 1 tablet (5 mg total) by mouth daily.  Marland Kitchen aspirin EC 81 MG tablet Take 81 mg by mouth at bedtime.   Marland Kitchen atorvastatin (LIPITOR) 80 MG tablet TAKE 1 TABLET BY MOUTH EVERY DAY  . benzonatate (TESSALON) 200 MG capsule Take 1 capsule (200 mg total) by mouth 3 (three) times daily as needed.  Marland Kitchen ELDERBERRY PO Take 1 tablet by mouth at bedtime.  Marland Kitchen ELIQUIS 2.5 MG TABS tablet TAKE 1 TABLET BY MOUTH  TWICE DAILY  . ferrous sulfate 325 (65 FE) MG tablet TAKE 1 TABLET BY MOUTH EVERY DAY  . Multiple Vitamins-Minerals (ONE-A-DAY VITACRAVES IMMUNITY) CHEW Chew 2 tablets by mouth 2 (two) times a week.   . Omega-3 Fatty Acids (FISH OIL) 1000 MG CAPS Take 1,000 mg by mouth 3 (three) times a week.   . sacubitril-valsartan (ENTRESTO) 97-103 MG Take 1 tablet by mouth 2 (two) times daily.  . traMADol (ULTRAM) 50 MG tablet Take 1 tablet (50 mg total) by mouth every 12 (twelve) hours as needed.  . carvedilol (COREG) 12.5 MG tablet Take 1 tablet (12.5 mg total) by mouth 2 (two) times daily.  Marland Kitchen spironolactone (ALDACTONE) 25 MG tablet Take 0.5 tablets (12.5 mg total) by mouth daily.   No facility-administered encounter medications on file as of 05/02/2020.    Allergies (verified) Lisinopril, Nsaids, and Penicillins   History: Past Medical History:  Diagnosis Date  . Arthritis   . BPH (benign prostatic hyperplasia)   . CHF (congestive heart failure) ([facility])    Nonischemic Cardiomyopathy. EF 25-35%  . Chronic kidney disease 05/20/2013   CKD 3  .  Colon polyps   . Dyslipidemia   . Dysrhythmia 08/19/11   "low heart beat; maybe 25bpm; got RX; now 60bpm"  . ED (erectile dysfunction)   . Gout   . Heart murmur   . Hyperlipidemia   . Hypertension   . Low back pain 08/12/2013  . Nonfunctioning kidney Recognized 03/2006   Chronically and Severely Hydronephrotic and Nonfunctioning Right Kidney  . Solitary kidney Recognized 03/2006   Solitary Functioning Left Kidney  . Stroke [facility])    "mild stroke; dx'd 2010;  don't know when it happened"  . Syncope and collapse 08/19/11   "shallow breathing; sweating horribly"   Past Surgical History:  Procedure Laterality Date  . CARDIOVASCULAR STRESS TEST  08/30/2010   Small to moderate fixed inferoseptal defect. Severe global hypokinesis with inferior akinesis and septal dyskinesis-likely secondary to a prominent LBBB. Non-diagnostic for ischemia due to persantine.  Marland Kitchen KNEE ARTHROSCOPY  1996   right  . TRANSTHORACIC ECHOCARDIOGRAM  08/20/2011   EF 45-50%, severe concentric hyperthrophy.   Family History  Problem Relation Age of Onset  . Early death Mother        died in child birth  . Stomach cancer Father        stomach   Social History   Socioeconomic History  . Marital status: Widowed    Spouse name: Not on file  . Number of children: Not on file  . Years of education: Not on file  . Highest education level: Not on file  Occupational History  . Not on file  Tobacco Use  . Smoking status: Never Smoker  . Smokeless tobacco: Never Used  Vaping Use  . Vaping Use: Never used  Substance and Sexual Activity  . Alcohol use: Yes    Comment: occasional  . Drug use: No  . Sexual activity: Yes  Other Topics Concern  . Not on file  Social History Narrative   Grew up in [place].  His mother died in childbirth when he was an infant.   Widowed 2019.  Was previously married for 37 years.   He works Corporate treasurer for [employer].   Social Determinants of Health   Financial Resource Strain: Low Risk   . Difficulty of Paying Living Expenses: Not hard at all  Food Insecurity: No Food Insecurity  . Worried About Charity fundraiser in the Last Year: Never true  . Ran Out of Food in the Last Year: Never true  Transportation Needs: No Transportation Needs  . Lack of Transportation (Medical): No  . Lack of Transportation (Non-Medical): No  Physical Activity: Inactive  . Days of Exercise per Week: 0 days  . Minutes of Exercise  per Session: 0 min  Stress: No Stress Concern Present  . Feeling of Stress : Not at all  Social Connections:   . Frequency of Communication with Friends and Family:   . Frequency of Social Gatherings with Friends and Family:   . Attends Religious Services:   . Active Member of Clubs or Organizations:   . Attends Archivist Meetings:   Marland Kitchen Marital Status:     Tobacco Counseling Counseling given: Not Answered   Clinical Intake:  Pre-visit preparation completed: Yes  Pain : No/denies pain     Nutritional Risks: None Diabetes: No  How often do you need to have someone help you when you read instructions, pamphlets, or other written materials from your doctor or pharmacy?: 1 - Never What is the last grade  level you completed in school?: 8th  Diabetic: No Nutrition Risk Assessment:  Has the patient had any N/V/D within the last 2 months?  No  Does the patient have any non-healing wounds?  No  Has the patient had any unintentional weight loss or weight gain?  No   Diabetes:  Is the patient diabetic?  No  If diabetic, was a CBG obtained today?  N/A Did the patient bring in their glucometer from home?  N/A How often do you monitor your CBG's? N/A.   Financial Strains and Diabetes Management:  Are you having any financial strains with the device, your supplies or your medication? N/A.  Does the patient want to be seen by Chronic Care Management for management of their diabetes?  N/A Would the patient like to be referred to a Nutritionist or for Diabetic Management?  N/A     Interpreter Needed?: No  Information entered by :: CJohnson, LPN   Activities of Daily Living In your present state of health, do you have any difficulty performing the following activities: 05/02/2020 06/27/2019  Hearing? N N  Vision? N N  Difficulty concentrating or making decisions? N N  Walking or climbing stairs? N N  Dressing or bathing? N N  Doing errands, shopping? N N    Preparing Food and eating ? N -  Using the Toilet? N -  In the past six months, have you accidently leaked urine? N -  Do you have problems with loss of bowel control? N -  Managing your Medications? N -  Managing your Finances? N -  Housekeeping or managing your Housekeeping? N -  Some recent data might be hidden    Patient Care Team: Tonia Ghent, MD as PCP - General (Family Medicine) Troy Sine, MD as PCP - Cardiology (Cardiology)  Indicate any recent Medical Services you may have received from other than [facility] providers in the past year (date may be approximate).     Assessment:   This is a routine wellness examination for [facility].  Hearing/Vision screen  Hearing Screening   125Hz  250Hz  500Hz  1000Hz  2000Hz  3000Hz  4000Hz  6000Hz  8000Hz   Right ear:           Left ear:           Vision Screening Comments: Patient gets annual eye exams   Dietary issues and exercise activities discussed: Current Exercise Habits: The patient does not participate in regular exercise at present, Exercise limited by: None identified  Goals    . Blood Pressure < 130/80    . Patient Stated     05/02/2020, I will maintain and continue medications as prescribed.       Depression Screen PHQ 2/9 Scores 05/02/2020 05/12/2019 02/11/2018 09/03/2017 08/07/2017 02/06/2017 03/21/2016  PHQ - 2 Score 0 0 0 0 0 0 3  PHQ- 9 Score 0 - - - - 0 3    Fall Risk Fall Risk  05/02/2020 09/03/2017 08/07/2017 03/21/2016 06/16/2014  Falls in the past year? 0 Yes No No No  Number falls in past yr: 0 1 - - -  Injury with Fall? 0 Yes - - -  Comment - back pain  - - -  Risk for fall due to : Medication side effect - - - -  Follow up Falls evaluation completed;Falls prevention discussed - - - -    Any stairs in or around the home? Yes  If so, are there any without handrails? No  Home free of loose  throw rugs in walkways, pet beds, electrical cords, etc? Yes  Adequate lighting in your home to reduce risk of falls?  Yes   ASSISTIVE DEVICES UTILIZED TO PREVENT FALLS:  Life alert? No  Use of a cane, walker or w/c? No  Grab bars in the bathroom? No  Shower chair or bench in shower? No  Elevated toilet seat or a handicapped toilet? No   TIMED UP AND GO:  Was the test performed? N/A, telephonic visit.    Cognitive Function: MMSE - Mini Mental State Exam 05/02/2020  Orientation to time 5  Orientation to Place 5  Registration 3  Attention/ Calculation 5  Recall 3  Language- repeat 1       Mini Cog  Mini-Cog screen was completed. Maximum score is 22. A value of 0 denotes this part of the MMSE was not completed or the patient failed this part of the Mini-Cog screening.  Immunizations Immunization History  Administered Date(s) Administered  . Influenza,inj,Quad PF,6+ Mos 09/22/2013, 06/16/2014, 08/07/2017, 07/06/2019  . Influenza-Unspecified 08/23/2015  . PFIZER SARS-COV-2 Vaccination 10/25/2019, 11/15/2019  . Pneumococcal Conjugate-13 09/22/2013  . Pneumococcal Polysaccharide-23 11/22/2011  . Td 09/23/2005    TDAP status: Due, Education has been provided regarding the importance of this vaccine. Advised may receive this vaccine at local pharmacy or Health Dept. Aware to provide a copy of the vaccination record if obtained from local pharmacy or Health Dept. Verbalized acceptance and understanding. Flu Vaccine status: due, will be available in the office at the end of August Pneumococcal vaccine status: Up to date Covid-19 vaccine status: Completed vaccines  Qualifies for Shingles Vaccine? Yes   Zostavax completed No   Shingrix Completed?: No.    Education has been provided regarding the importance of this vaccine. Patient has been advised to call insurance company to determine out of pocket expense if they have not yet received this vaccine. Advised may also receive vaccine at local pharmacy or Health Dept. Verbalized acceptance and understanding.  Screening Tests Health Maintenance   Topic Date Due  . INFLUENZA VACCINE  04/23/2020  . TETANUS/TDAP  05/02/2024 (Originally 09/24/2015)  . COVID-19 Vaccine  Completed  . PNA vac Low Risk Adult  Completed    Health Maintenance  Health Maintenance Due  Topic Date Due  . INFLUENZA VACCINE  04/23/2020    Colorectal cancer screening: No longer required.   Lung Cancer Screening: (Low Dose CT Chest recommended if Age 9-80 years, 30 pack-year currently smoking OR have quit w/in 15years.) does not qualify.    Additional Screening:  Hepatitis C Screening: does not qualify; Completed N/A  Vision Screening: Recommended annual ophthalmology exams for early detection of glaucoma and other disorders of the eye. Is the patient up to date with their annual eye exam?  Yes  Who is the provider or what is the name of the office in which the patient attends annual eye exams? Dr. Erskine Emery If pt is not established with a provider, would they like to be referred to a provider to establish care? No .   Dental Screening: Recommended annual dental exams for proper oral hygiene  Community Resource Referral / Chronic Care Management: CRR required this visit?  No   CCM required this visit?  No      Plan:     I have personally reviewed and noted the following in the patient's chart:   . Medical and social history . Use of alcohol, tobacco or illicit drugs  . Current medications and supplements .  Functional ability and status . Nutritional status . Physical activity . Advanced directives . List of other physicians . Hospitalizations, surgeries, and ER visits in previous 12 months . Vitals . Screenings to include cognitive, depression, and falls . Referrals and appointments  In addition, I have reviewed and discussed with patient certain preventive protocols, quality metrics, and best practice recommendations. A written personalized care plan for preventive services as well as general preventive health recommendations were  provided to patient.   Due to this being a telephonic visit, the after visit summary with patients personalized plan was offered to patient via mail or my-chart. Patient preferred to pick up at office at next visit.   Andrez Grime, LPN   [id number]

## 2020-05-03 ENCOUNTER — Telehealth: Payer: Self-pay | Admitting: Family Medicine

## 2020-05-03 NOTE — Telephone Encounter (Signed)
Please check with patient.  Is it possible to get OV set up for this fall (acknowledging covid considerations may change the plan this fall)?  We could recheck his labs at the visit.  Thanks.

## 2020-05-04 NOTE — Telephone Encounter (Signed)
Patient advised. Appointment scheduled.  

## 2020-05-15 ENCOUNTER — Telehealth: Payer: Self-pay

## 2020-05-15 NOTE — Telephone Encounter (Signed)
Daughter advised and letter left at the front desk for pickup.

## 2020-05-15 NOTE — Telephone Encounter (Signed)
Letter done. Thanks.

## 2020-05-15 NOTE — Telephone Encounter (Signed)
Patient's daughter contacted the office. She states that she, as well as her daughter are the primary care takers of the patient. She states that due to covid, and the patient's heath issues, she would like her daughter to be enrolled in school virtually. She states she needs a letter with the patient's current health issues to give to the school so this can be approved. Dr. Damita Dunnings, please advise.

## 2020-05-15 NOTE — Telephone Encounter (Signed)
Tried to return the call to the daughter, no answer, no VM.

## 2020-05-26 IMAGING — CR DG CHEST 2V
2 series · 2 of 2 positions shown · non-contrast
Comparison: 08/19/2011

CLINICAL DATA: Tachycardia after awakening

EXAM:
CHEST - 2 VIEW

[chest lat]
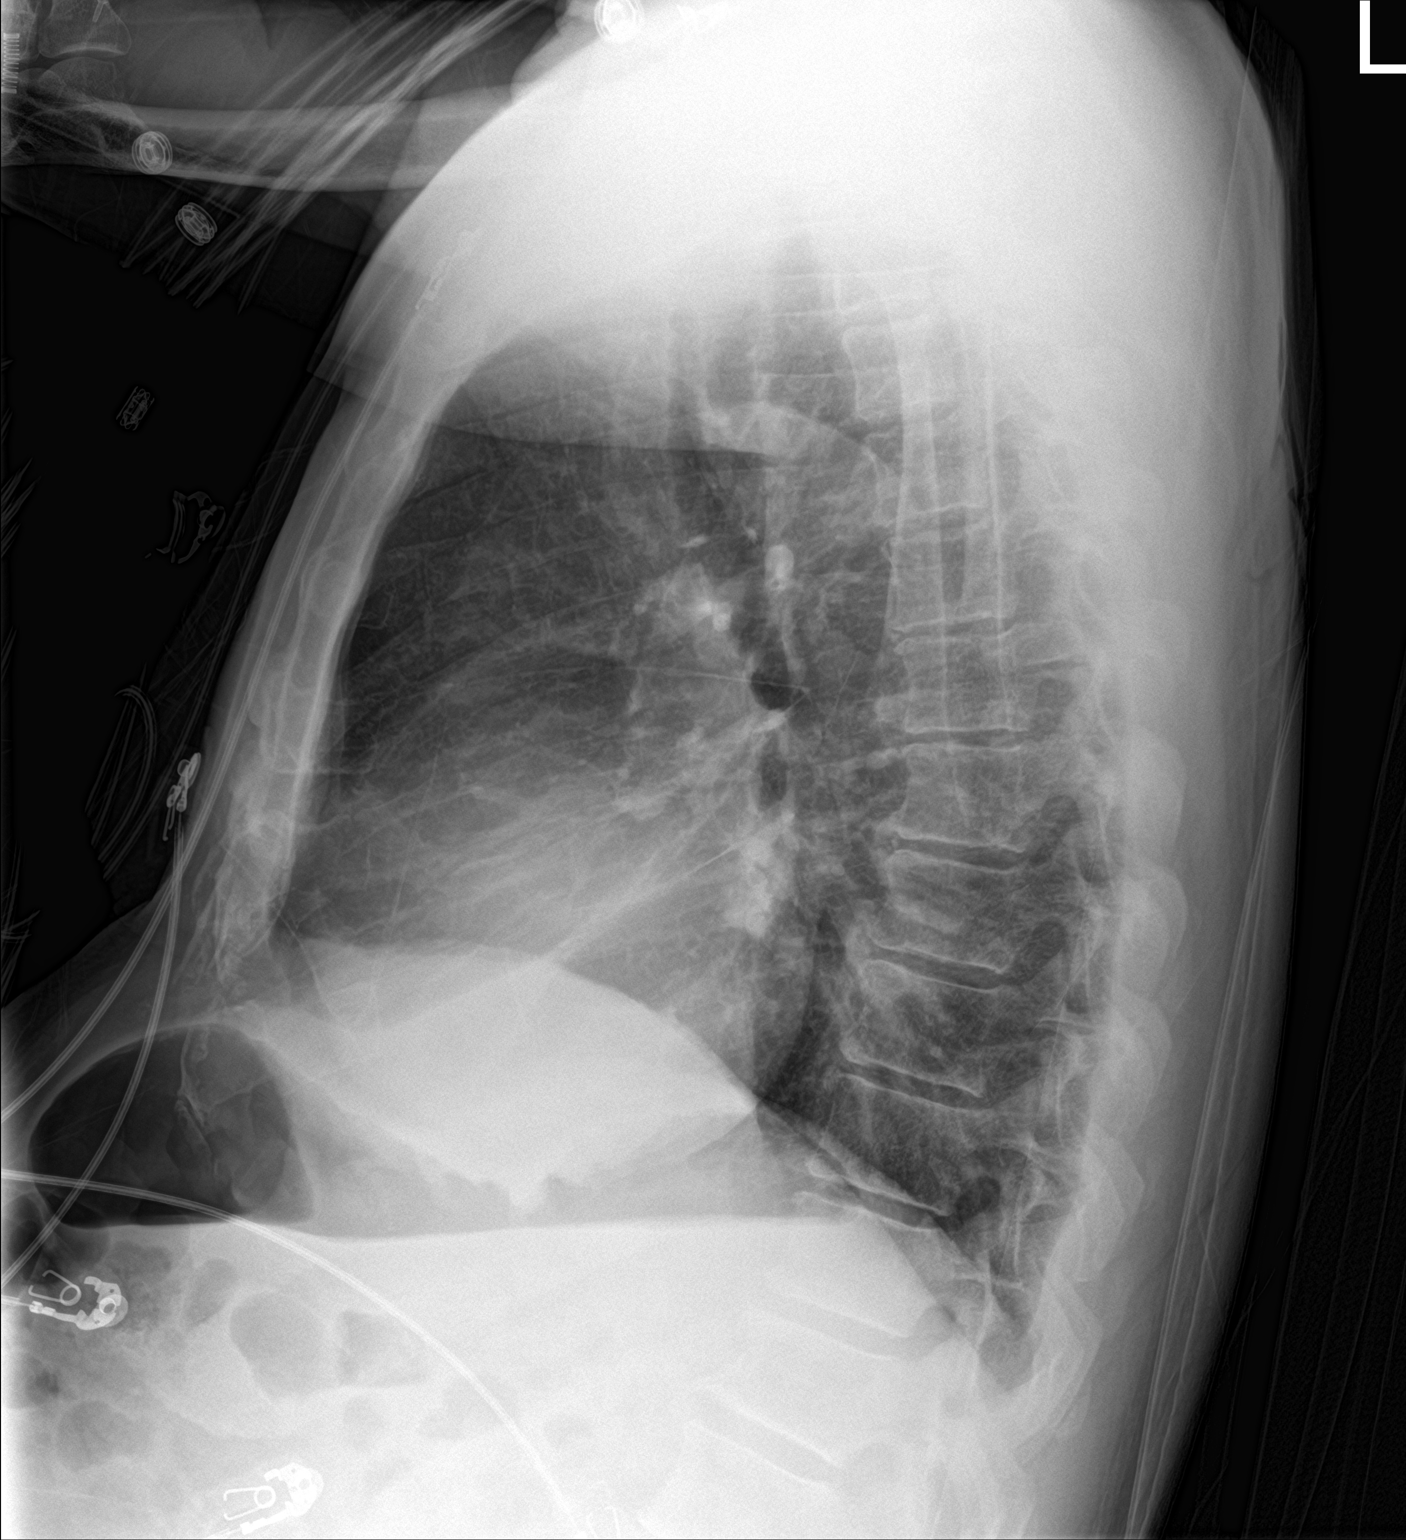

[chest ap]
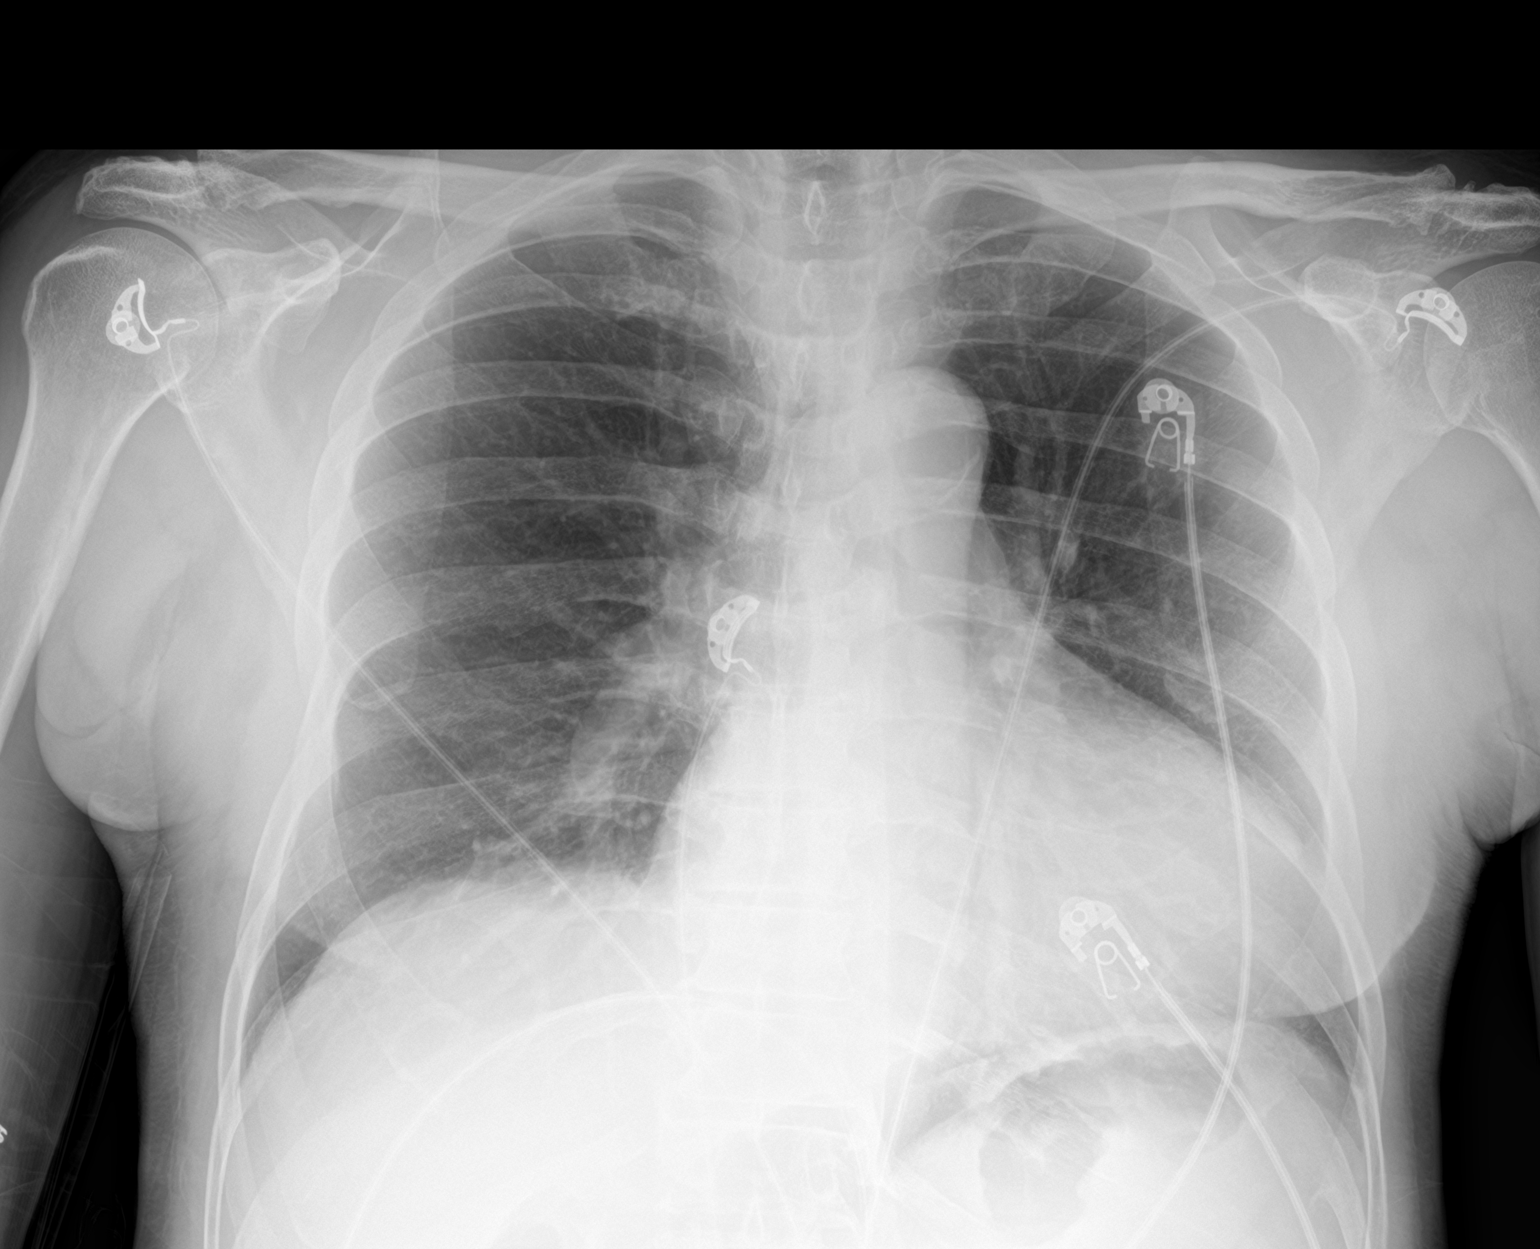

[2 of 2 positions shown; findings below may reference images not displayed]

FINDINGS: Stable cardiomegaly with aortic atherosclerosis. Slight elevation of
the right hemidiaphragm is chronic and stable in appearance. No
pulmonary consolidation, effusion or pneumothorax. Mild degenerative
change noted along the dorsal spine.
IMPRESSION: No active cardiopulmonary disease. Redemonstration of mild
cardiomegaly and aortic atherosclerosis without significant change.

## 2020-06-20 ENCOUNTER — Encounter: Payer: Self-pay | Admitting: Family Medicine

## 2020-06-20 ENCOUNTER — Ambulatory Visit (INDEPENDENT_AMBULATORY_CARE_PROVIDER_SITE_OTHER): Payer: Medicare Other | Admitting: Family Medicine

## 2020-06-20 ENCOUNTER — Other Ambulatory Visit: Payer: Self-pay

## 2020-06-20 VITALS — BP 122/56 | HR 62 | Temp 96.6°F | Ht 66.5 in | Wt 178.0 lb

## 2020-06-20 DIAGNOSIS — I509 Heart failure, unspecified: Secondary | ICD-10-CM

## 2020-06-20 DIAGNOSIS — R05 Cough: Secondary | ICD-10-CM

## 2020-06-20 DIAGNOSIS — D509 Iron deficiency anemia, unspecified: Secondary | ICD-10-CM

## 2020-06-20 DIAGNOSIS — R059 Cough, unspecified: Secondary | ICD-10-CM

## 2020-06-20 DIAGNOSIS — N183 Chronic kidney disease, stage 3 unspecified: Secondary | ICD-10-CM | POA: Diagnosis not present

## 2020-06-20 DIAGNOSIS — N529 Male erectile dysfunction, unspecified: Secondary | ICD-10-CM

## 2020-06-20 DIAGNOSIS — Z23 Encounter for immunization: Secondary | ICD-10-CM | POA: Diagnosis not present

## 2020-06-20 DIAGNOSIS — E785 Hyperlipidemia, unspecified: Secondary | ICD-10-CM

## 2020-06-20 DIAGNOSIS — M1000 Idiopathic gout, unspecified site: Secondary | ICD-10-CM

## 2020-06-20 MED ORDER — SILDENAFIL CITRATE 20 MG PO TABS: ORAL_TABLET | ORAL | 5 refills | Status: DC

## 2020-06-20 MED ORDER — BENZONATATE 200 MG PO CAPS: 200.0000 mg | ORAL_CAPSULE | Freq: Three times a day (TID) | ORAL | 1 refills | Status: DC | PRN

## 2020-06-20 NOTE — Patient Instructions (Addendum)
It would be a good idea to get a pfizer booster shot this fall.    Flu shot today.   Go to the lab on the way out.   If you have mychart we'll likely use that to update you.     Take care.  Glad to see you.

## 2020-06-20 NOTE — Progress Notes (Signed)
This visit occurred during the SARS-CoV-2 public health emergency.  Safety protocols were in place, including screening questions prior to the visit, additional usage of staff PPE, and extensive cleaning of exam room while observing appropriate contact time as indicated for disinfecting solutions.  Hypertension/CHF  Using medication without problems or lightheadedness: yes, cautions on standing d/w pt.   Chest pain with exertion: no Edema:no Short of breath: no  Elevated Cholesterol: Using medications without problems: yes Muscle aches: no Diet compliance: d/w pt.   Exercise: d/w pt.  Encouraged as tolerated- limited after eye surgery.    No recent gout sx.  D/w pt. see follow-up labs.  H/o CKD.  Avoiding naids, had seen renal clinic in the past.   Due for follow-up labs.  See notes on labs.  He had used tessalon prn cough, not needed now but rx printed to hold.  Routine cautions d/w pt.    Has used sildenafil with effect and without adverse event.  He is not fasting for his labs today.  We will update cardiology in the renal clinic with his labs.  Meds, vitals, and allergies reviewed.   PMH and SH reviewed  ROS: Per HPI unless specifically indicated in ROS section   GEN: nad, alert and oriented HEENT: ncat NECK: supple w/o LA CV: rrr. PULM: ctab, no inc wob ABD: soft, +bs EXT: no edema SKIN: no acute rash

## 2020-06-21 ENCOUNTER — Other Ambulatory Visit: Payer: Self-pay | Admitting: Family Medicine

## 2020-06-21 ENCOUNTER — Encounter: Payer: Self-pay | Admitting: Family Medicine

## 2020-06-21 DIAGNOSIS — E039 Hypothyroidism, unspecified: Secondary | ICD-10-CM | POA: Insufficient documentation

## 2020-06-21 DIAGNOSIS — N529 Male erectile dysfunction, unspecified: Secondary | ICD-10-CM | POA: Insufficient documentation

## 2020-06-21 DIAGNOSIS — I509 Heart failure, unspecified: Secondary | ICD-10-CM

## 2020-06-21 DIAGNOSIS — R059 Cough, unspecified: Secondary | ICD-10-CM | POA: Insufficient documentation

## 2020-06-21 LAB — COMPREHENSIVE METABOLIC PANEL
ALT: 13 U/L (ref 0–53)
AST: 13 U/L (ref 0–37)
Albumin: 4.2 g/dL (ref 3.5–5.2)
Alkaline Phosphatase: 83 U/L (ref 39–117)
BUN: 22 mg/dL (ref 6–23)
CO2: 29 mEq/L (ref 19–32)
Calcium: 9.3 mg/dL (ref 8.4–10.5)
Chloride: 105 mEq/L (ref 96–112)
Creatinine, Ser: 2.02 mg/dL — ABNORMAL HIGH (ref 0.40–1.50)
GFR: 38.05 mL/min — ABNORMAL LOW (ref >60.00)
Glucose, Bld: 109 mg/dL — ABNORMAL HIGH (ref 70–99)
Potassium: 5.2 mEq/L — ABNORMAL HIGH (ref 3.5–5.1)
Sodium: 141 mEq/L (ref 135–145)
Total Bilirubin: 0.5 mg/dL (ref 0.2–1.2)
Total Protein: 6.6 g/dL (ref 6.0–8.3)

## 2020-06-21 LAB — CBC WITH DIFFERENTIAL/PLATELET
Basophils Absolute: 0.1 10*3/uL (ref 0.0–0.1)
Basophils Relative: 1.6 % (ref 0.0–3.0)
Eosinophils Absolute: 0.1 10*3/uL (ref 0.0–0.7)
Eosinophils Relative: 1.3 % (ref 0.0–5.0)
HCT: 33.7 % — ABNORMAL LOW (ref 39.0–52.0)
Hemoglobin: 11.4 g/dL — ABNORMAL LOW (ref 13.0–17.0)
Lymphocytes Relative: 31.4 % (ref 12.0–46.0)
Lymphs Abs: 1.3 10*3/uL (ref 0.7–4.0)
MCHC: 33.8 g/dL (ref 30.0–36.0)
MCV: 86.7 fl (ref 78.0–100.0)
Monocytes Absolute: 0.7 10*3/uL (ref 0.1–1.0)
Monocytes Relative: 17.8 % — ABNORMAL HIGH (ref 3.0–12.0)
Neutro Abs: 2 10*3/uL (ref 1.4–7.7)
Neutrophils Relative %: 47.9 % (ref 43.0–77.0)
Platelets: 197 10*3/uL (ref 150.0–400.0)
RBC: 3.89 Mil/uL — ABNORMAL LOW (ref 4.22–5.81)
RDW: 13.7 % (ref 11.5–15.5)
WBC: 4.1 10*3/uL (ref 4.0–10.5)

## 2020-06-21 LAB — LIPID PANEL
Cholesterol: 121 mg/dL (ref 0–200)
HDL: 41.8 mg/dL (ref >39.00)
LDL Cholesterol: 62 mg/dL (ref 0–99)
NonHDL: 79.41
Total CHOL/HDL Ratio: 3
Triglycerides: 89 mg/dL (ref 0.0–149.0)
VLDL: 17.8 mg/dL (ref 0.0–40.0)

## 2020-06-21 LAB — URIC ACID: Uric Acid, Serum: 6.8 mg/dL (ref 4.0–7.8)

## 2020-06-21 LAB — TSH: TSH: 14.96 u[IU]/mL — ABNORMAL HIGH (ref 0.35–4.50)

## 2020-06-21 LAB — IRON: Iron: 83 ug/dL (ref 42–165)

## 2020-06-21 MED ORDER — LEVOTHYROXINE SODIUM 25 MCG PO TABS: 25.0000 ug | ORAL_TABLET | Freq: Every day | ORAL | 3 refills | Status: DC

## 2020-06-21 NOTE — Assessment & Plan Note (Signed)
Can use sildenafil as needed with routine cautions.  He agrees.

## 2020-06-21 NOTE — Assessment & Plan Note (Signed)
Can use Tessalon if needed.  Not needed now.

## 2020-06-21 NOTE — Assessment & Plan Note (Signed)
Continue amiodarone, amlodipine, Lipitor, carvedilol, Eliquis, spironolactone, Entresto.  No change in meds at this point.  See notes on labs.  No chest pain.  Not short of breath.

## 2020-06-21 NOTE — Assessment & Plan Note (Signed)
No recent symptoms.  See notes on labs.

## 2020-06-21 NOTE — Assessment & Plan Note (Signed)
Continue atorvastatin.  Not fasting for labs.

## 2020-06-21 NOTE — Assessment & Plan Note (Deleted)
Continue to avoid NSAIDs.  Continue Entresto.  See notes on labs.

## 2020-06-21 NOTE — Assessment & Plan Note (Signed)
Continue to avoid NSAIDs.  Continue Entresto.  See notes on labs.

## 2020-06-22 DIAGNOSIS — H25811 Combined forms of age-related cataract, right eye: Secondary | ICD-10-CM | POA: Insufficient documentation

## 2020-07-27 ENCOUNTER — Other Ambulatory Visit: Payer: Self-pay | Admitting: Cardiovascular Disease

## 2020-08-14 ENCOUNTER — Ambulatory Visit: Payer: Medicare Other | Attending: Internal Medicine

## 2020-08-14 DIAGNOSIS — Z23 Encounter for immunization: Secondary | ICD-10-CM

## 2020-08-14 NOTE — Progress Notes (Signed)
   Covid-19 Vaccination Clinic  Name:  John Hines    MRN: [id number] DOB: [birth year]  08/14/2020  [name] was observed post Covid-19 immunization for 15 minutes without incident. He was provided with Vaccine Information Sheet and instruction to access the V-Safe system.   [name] was instructed to call 911 with any severe reactions post vaccine: Marland Kitchen Difficulty breathing  . Swelling of face and throat  . A fast heartbeat  . A bad rash all over body  . Dizziness and weakness   Immunizations Administered    Name Date Dose VIS Date Route   Pfizer COVID-19 Vaccine 08/14/2020  3:02 PM 0.3 mL 07/12/2020 Intramuscular   Manufacturer: [place]   Lot: [id number]   Onyx: [address]-5

## 2020-08-16 ENCOUNTER — Other Ambulatory Visit: Payer: Self-pay | Admitting: Cardiovascular Disease

## 2020-08-22 ENCOUNTER — Other Ambulatory Visit: Payer: Self-pay

## 2020-08-22 ENCOUNTER — Other Ambulatory Visit (INDEPENDENT_AMBULATORY_CARE_PROVIDER_SITE_OTHER): Payer: Medicare Other

## 2020-08-22 ENCOUNTER — Other Ambulatory Visit: Payer: Self-pay | Admitting: Cardiology

## 2020-08-22 DIAGNOSIS — I509 Heart failure, unspecified: Secondary | ICD-10-CM

## 2020-08-22 LAB — BASIC METABOLIC PANEL
BUN: 23 mg/dL (ref 6–23)
CO2: 29 mEq/L (ref 19–32)
Calcium: 8.9 mg/dL (ref 8.4–10.5)
Chloride: 106 mEq/L (ref 96–112)
Creatinine, Ser: 2.04 mg/dL — ABNORMAL HIGH (ref 0.40–1.50)
GFR: 28.95 mL/min — ABNORMAL LOW (ref >60.00)
Glucose, Bld: 122 mg/dL — ABNORMAL HIGH (ref 70–99)
Potassium: 5 mEq/L (ref 3.5–5.1)
Sodium: 140 mEq/L (ref 135–145)

## 2020-08-22 LAB — CBC WITH DIFFERENTIAL/PLATELET
Basophils Absolute: 0 10*3/uL (ref 0.0–0.1)
Basophils Relative: 0.5 % (ref 0.0–3.0)
Eosinophils Absolute: 0.1 10*3/uL (ref 0.0–0.7)
Eosinophils Relative: 1.7 % (ref 0.0–5.0)
HCT: 34 % — ABNORMAL LOW (ref 39.0–52.0)
Hemoglobin: 11.5 g/dL — ABNORMAL LOW (ref 13.0–17.0)
Lymphocytes Relative: 30.5 % (ref 12.0–46.0)
Lymphs Abs: 1.2 10*3/uL (ref 0.7–4.0)
MCHC: 33.7 g/dL (ref 30.0–36.0)
MCV: 85.1 fl (ref 78.0–100.0)
Monocytes Absolute: 0.7 10*3/uL (ref 0.1–1.0)
Monocytes Relative: 17.7 % — ABNORMAL HIGH (ref 3.0–12.0)
Neutro Abs: 1.9 10*3/uL (ref 1.4–7.7)
Neutrophils Relative %: 49.6 % (ref 43.0–77.0)
Platelets: 182 10*3/uL (ref 150.0–400.0)
RBC: 3.99 Mil/uL — ABNORMAL LOW (ref 4.22–5.81)
RDW: 13.4 % (ref 11.5–15.5)
WBC: 3.8 10*3/uL — ABNORMAL LOW (ref 4.0–10.5)

## 2020-08-22 LAB — TSH: TSH: 13.16 u[IU]/mL — ABNORMAL HIGH (ref 0.35–4.50)

## 2020-08-23 ENCOUNTER — Other Ambulatory Visit: Payer: Self-pay | Admitting: Family Medicine

## 2020-08-23 DIAGNOSIS — E039 Hypothyroidism, unspecified: Secondary | ICD-10-CM

## 2020-08-23 MED ORDER — LEVOTHYROXINE SODIUM 50 MCG PO TABS: 50.0000 ug | ORAL_TABLET | Freq: Every day | ORAL | 3 refills | Status: DC

## 2020-08-23 NOTE — Telephone Encounter (Signed)
Pt is scheduled to see Urs[facility] on 08/30/2020.  Prescription refill sent.

## 2020-08-23 NOTE — Telephone Encounter (Addendum)
Prescription refill request for Eliquis received. Indication:Afib Last office visit:  [place] 08/18/2019 Scr: 2.04, 08/22/2020 Age: 86  Weight: 80.7 kg    Pt overdue for office visit. Reached out to Ep scheduler to schedule an appointment.

## 2020-08-28 NOTE — Progress Notes (Signed)
Cardiology Office Note Date:  08/30/2020  Patient ID:  John Hines, John Hines [birth year], MRN [id number] PCP:  Tonia Ghent, MD  Cardiologist:  Dr. Claiborne Billings Electrophysiologist: Dr. Curt Bears    Chief Complaint:   previously scheduled visit  History of Present Illness: John Hines is a 86 y.o. male with history of CKD (III), HLD, HTN, LBBB, chronic CHF (systolic), NICM, gout, PAFib, SVT, stroke mentionined in his problem list, hypothyroidism  He was seen by Dr. Curt Bears during a hospital stay Oct 2020 for question of AFlutter, consult note mentions w/adenosine no reports of flutter waves were reported and suspected an ATach, during his stay he did have some PAFib, was started on amiodarone and eliquis  He last saw the EP service by A. Chalmers Cater, [place] Nov 2020, was accompanied by his daughter, reported sone "fogginess" and reported nephrology had some plans for medicine adjustments, not certain what those were. His amiodarone was reduced to maintenance of 200mg  daily,felt to be euvolemic.  He was planned to f/u with nephrology, suspected if his entresto needed reduction of discontinuation . May need to switch to imdur/hydral. Mentioned previously discussed ICD. Overall stable symptoms, and with age and co-morbidities unlikely to benefit at this time  Saw PMD via telehealth Sept 2021, remained in [place]. Labs Nov 2021, PMd addressed, no changes to his Delene Loll, synthroid was adjusted  TODAY He comes with his daughter. He is doing very well No CP, palpitations or cardiac awareness. No SOB or DOE He is quite active about the house and yard, feels like he has good/stable exertional capacity  No dizzy spells, near syncope or syncope. His nephrologist retired and has a new one, can not recall the name, though still with [facility] His PMD has been following his labs, and more recently especially his TSH/thyroid, his synthroid recently adjusted again.   AFib Hx Diagnosed Oct  2020 AAD Amiodarone Oct 2020   Past Medical History:  Diagnosis Date  . Arthritis   . BPH (benign prostatic hyperplasia)   . CHF (congestive heart failure) ([place])    Nonischemic Cardiomyopathy. EF 25-35%  . Chronic kidney disease 05/20/2013   CKD 3  . Colon polyps   . Dyslipidemia   . Dysrhythmia 08/19/11   "low heart beat; maybe 25bpm; got RX; now 60bpm"  . ED (erectile dysfunction)   . Gout   . Heart murmur   . Hyperlipidemia   . Hypertension   . Low back pain 08/12/2013  . Nonfunctioning kidney Recognized 03/2006   Chronically and Severely Hydronephrotic and Nonfunctioning Right Kidney  . Solitary kidney Recognized 03/2006   Solitary Functioning Left Kidney  . Stroke [facility])    "mild stroke; dx'd 2010; don't know when it happened"  . Syncope and collapse 08/19/11   "shallow breathing; sweating horribly"    Past Surgical History:  Procedure Laterality Date  . CARDIOVASCULAR STRESS TEST  08/30/2010   Small to moderate fixed inferoseptal defect. Severe global hypokinesis with inferior akinesis and septal dyskinesis-likely secondary to a prominent LBBB. Non-diagnostic for ischemia due to persantine.  Marland Kitchen KNEE ARTHROSCOPY  1996   right  . TRANSTHORACIC ECHOCARDIOGRAM  08/20/2011   EF 45-50%, severe concentric hyperthrophy.    Current Outpatient Medications  Medication Sig Dispense Refill  . acetaminophen (TYLENOL) 500 MG tablet Take 500-1,000 mg by mouth every 8 (eight) hours as needed for mild pain or headache.     Marland Kitchen amiodarone (PACERONE) 100 MG tablet Take 1 tablet (100 mg total) by mouth daily.  90 tablet 2  . amLODipine (NORVASC) 5 MG tablet TAKE 1 TABLET BY MOUTH  DAILY 90 tablet 3  . aspirin EC 81 MG tablet Take 81 mg by mouth at bedtime.     Marland Kitchen atorvastatin (LIPITOR) 80 MG tablet TAKE 1 TABLET BY MOUTH EVERY DAY 90 tablet 1  . benzonatate (TESSALON) 200 MG capsule Take 1 capsule (200 mg total) by mouth 3 (three) times daily as needed. 40 capsule 1  . carvedilol (COREG)  12.5 MG tablet Take 1 tablet (12.5 mg total) by mouth 2 (two) times daily. 180 tablet 3  . ELDERBERRY PO Take 1 tablet by mouth at bedtime.    Marland Kitchen ELIQUIS 2.5 MG TABS tablet TAKE 1 TABLET BY MOUTH  TWICE DAILY 180 tablet 0  . ENTRESTO 97-103 MG TAKE 1 TABLET BY MOUTH  TWICE DAILY 180 tablet 3  . ferrous sulfate 325 (65 FE) MG tablet TAKE 1 TABLET BY MOUTH EVERY DAY 90 tablet 2  . levothyroxine (SYNTHROID) 50 MCG tablet Take 1 tablet (50 mcg total) by mouth daily before breakfast. 90 tablet 3  . Multiple Vitamins-Minerals (ONE-A-DAY VITACRAVES IMMUNITY) CHEW Chew 2 tablets by mouth 2 (two) times a week.     . Omega-3 Fatty Acids (FISH OIL) 1000 MG CAPS Take 1,000 mg by mouth 3 (three) times a week.     . sildenafil (REVATIO) 20 MG tablet TAKE 3-5 TABLETS BY MOUTH EVERY DAY AS NEEDED ERECTILE DYSFUNCTION.  Don't take with nitroglycerin. 50 tablet 5  . spironolactone (ALDACTONE) 25 MG tablet TAKE ONE-HALF TABLET BY  MOUTH DAILY 45 tablet 0   No current facility-administered medications for this visit.    Allergies:   Lisinopril, Nsaids, and Penicillins   Social History:  The patient  reports that he has never smoked. He has never used smokeless tobacco. He reports current alcohol use. He reports that he does not use drugs.   Family History:  The patient's family history includes Early death in his mother; Stomach cancer in his father.  ROS:  Please see the history of present illness.    All other systems are reviewed and otherwise negative.   PHYSICAL EXAM:  VS:  BP 134/60   Pulse 62   Ht 5' 6.5" (1.689 m)   Wt 185 lb 9.6 oz (84.2 kg)   SpO2 96%   BMI 29.51 kg/m  BMI: Body mass index is 29.51 kg/m. Well nourished, well developed, in no acute distress, appears younger then his age 59: normocephalic, atraumatic Neck: no JVD, carotid bruits or masses Cardiac:  RRR; no significant murmurs, no rubs, or gallops Lungs:  CTA b/l, no wheezing, rhonchi or rales Abd: soft, nontender MS: no  deformity or atrophy Ext: no edema Skin: warm and dry, no rash Neuro:  No gross deficits appreciated Psych: euthymic mood, full affect   EKG:  Done today and reviewed by myself shows  SR, LBBB, 1st degree [place], PR 232ms  08/31/2018: TTE Study Conclusions  - Left ventricle: The cavity size was normal. There was severe  focal basal hypertrophy of the septum. Systolic function was  moderately to severely reduced. The estimated ejection fraction  was in the range of 30% to 35%. Diffuse hypokinesis. Doppler  parameters are consistent with abnormal left ventricular  relaxation (grade 1 diastolic dysfunction).  - Aortic valve: Trileaflet; mildly thickened, mildly calcified  leaflets. There was trivial regurgitation.  - Left atrium: The atrium was mildly dilated.   Impressions:  - EF is mildly improved when  compared to prior. (25%)   Recent Labs: 06/20/2020: ALT 13 08/22/2020: BUN 23; Creatinine, Ser 2.04; Hemoglobin 11.5; Platelets 182.0; Potassium 5.0; Sodium 140; TSH 13.16  06/20/2020: Cholesterol 121; HDL 41.80; LDL Cholesterol 62; Total CHOL/HDL Ratio 3; Triglycerides 89.0; VLDL 17.8   Estimated Creatinine Clearance: 26.7 mL/min (A) (by C-G formula based on SCr of 2.04 mg/dL (H)).   Wt Readings from Last 3 Encounters:  08/30/20 185 lb 9.6 oz (84.2 kg)  06/20/20 178 lb (80.7 kg)  10/05/19 170 lb 3.2 oz (77.2 kg)     Other studies reviewed: Additional studies/records reviewed today include: summarized above  ASSESSMENT AND PLAN:  1. Paroxysmal Afib     CHA2DS2Vasc is 4, on Eliquis, appropriately dosed for age/creat     No symptoms to suggest recurrence or any significant burden     On amio about a year, TSH had been climbing, started on synthroid, managing with his PMD     LFTs have been ok, though given his TSH, I will reduce his amio to 100mg  daily, he will continue to follow withhis PMD his thyroid (has labs scheduled for feb)  2. NICM 3. Chronic CHF,  systolic     Weight is up though no symptoms or exam findings to suggest volume OL     He thinks is diet related, does not feel like he is retaining water     Discussed daily weights at home  4. HTN     Looks ok  Disposition: F/u in 18mo, sooner if needed  Current medicines are reviewed at length with the patient today.  The patient did not have any concerns regarding medicines.  Venetia Night, PA-C 08/30/2020 5:56 PM     [address] [place] [address] [place] [address] [phone] (office)  [phone] (fax)

## 2020-08-30 ENCOUNTER — Other Ambulatory Visit: Payer: Self-pay

## 2020-08-30 ENCOUNTER — Encounter: Payer: Self-pay | Admitting: Physician Assistant

## 2020-08-30 ENCOUNTER — Ambulatory Visit: Payer: Medicare Other | Admitting: Physician Assistant

## 2020-08-30 VITALS — BP 134/60 | HR 62 | Ht 66.5 in | Wt 185.6 lb

## 2020-08-30 DIAGNOSIS — I10 Essential (primary) hypertension: Secondary | ICD-10-CM | POA: Diagnosis not present

## 2020-08-30 DIAGNOSIS — I428 Other cardiomyopathies: Secondary | ICD-10-CM | POA: Diagnosis not present

## 2020-08-30 DIAGNOSIS — I480 Paroxysmal atrial fibrillation: Secondary | ICD-10-CM

## 2020-08-30 DIAGNOSIS — I5022 Chronic systolic (congestive) heart failure: Secondary | ICD-10-CM

## 2020-08-30 MED ORDER — AMIODARONE HCL 100 MG PO TABS
100.0000 mg | ORAL_TABLET | Freq: Every day | ORAL | 2 refills | Status: DC
Start: 2020-08-30 — End: 2020-08-31

## 2020-08-30 MED ORDER — AMIODARONE HCL 100 MG PO TABS
100.0000 mg | ORAL_TABLET | Freq: Every day | ORAL | 1 refills | Status: DC
Start: 2020-08-30 — End: 2020-08-30

## 2020-08-30 MED ORDER — AMIODARONE HCL 100 MG PO TABS
100.0000 mg | ORAL_TABLET | Freq: Every day | ORAL | 0 refills | Status: DC
Start: 2020-08-30 — End: 2020-08-30

## 2020-08-30 NOTE — Patient Instructions (Addendum)
Medication Instructions:   START TAKING  AMIODARONE 100 MG ONCE A DAY   *If you need a refill on your cardiac medications before your next appointment, please call your pharmacy*   Lab Work: NONE ORDERED  TODAY   If you have labs (blood work) drawn today and your tests are completely normal, you will receive your results only by: Marland Kitchen MyChart Message (if you have MyChart) OR . A paper copy in the mail If you have any lab test that is abnormal or we need to change your treatment, we will call you to review the results.   Testing/Procedures:NONE ORDERED  TODAY   Follow-Up: At [facility], you and your health needs are our priority.  As part of our continuing mission to provide you with exceptional heart care, we have created designated Provider Care Teams.  These Care Teams include your primary Cardiologist (physician) and Advanced Practice Providers (APPs -  Physician Assistants and Nurse Practitioners) who all work together to provide you with the care you need, when you need it.  We recommend signing up for the patient portal called "MyChart".  Sign up information is provided on this After Visit Summary.  MyChart is used to connect with patients for Virtual Visits (Telemedicine).  Patients are able to view lab/test results, encounter notes, upcoming appointments, etc.  Non-urgent messages can be sent to your provider as well.   To learn more about what you can do with MyChart, go to [email or web address].    Your next appointment:   4 month(s)  The format for your next appointment:   In Person  Provider:   You may see Dr. Curt Bears or one of the following Advanced Practice Providers on your designated Care Team:    Tommye Standard, [place]  Other Instructions

## 2020-08-31 ENCOUNTER — Other Ambulatory Visit: Payer: Self-pay | Admitting: *Deleted

## 2020-08-31 MED ORDER — AMIODARONE HCL 200 MG PO TABS: 100.0000 mg | ORAL_TABLET | Freq: Every day | ORAL | 1 refills | Status: DC

## 2020-08-31 NOTE — Addendum Note (Signed)
Addended by: Claude Manges on: 08/31/2020 04:03 PM   Modules accepted: Orders

## 2020-08-31 NOTE — Telephone Encounter (Signed)
Lvm on Dtr phone of Rx sent in 200 mg amiodarone cut in half for 100 mg daily due to insurance coverage not covering 100 mg tablets. Clinic number left incase need to contact clinic back with any questions

## 2020-09-12 ENCOUNTER — Other Ambulatory Visit: Payer: Self-pay | Admitting: Cardiovascular Disease

## 2020-10-25 ENCOUNTER — Other Ambulatory Visit: Payer: Self-pay

## 2020-10-25 ENCOUNTER — Other Ambulatory Visit: Payer: Self-pay | Admitting: Cardiovascular Disease

## 2020-10-25 ENCOUNTER — Other Ambulatory Visit: Payer: Self-pay | Admitting: Family Medicine

## 2020-10-25 ENCOUNTER — Other Ambulatory Visit (INDEPENDENT_AMBULATORY_CARE_PROVIDER_SITE_OTHER): Payer: Medicare Other

## 2020-10-25 DIAGNOSIS — E039 Hypothyroidism, unspecified: Secondary | ICD-10-CM

## 2020-10-25 LAB — TSH: TSH: 6.82 u[IU]/mL — ABNORMAL HIGH (ref 0.35–4.50)

## 2020-10-25 MED ORDER — LEVOTHYROXINE SODIUM 75 MCG PO TABS: 75.0000 ug | ORAL_TABLET | Freq: Every day | ORAL | 3 refills | Status: DC

## 2020-11-07 ENCOUNTER — Other Ambulatory Visit: Payer: Self-pay | Admitting: Cardiovascular Disease

## 2020-11-13 ENCOUNTER — Other Ambulatory Visit: Payer: Self-pay | Admitting: Cardiology

## 2020-11-14 NOTE — Telephone Encounter (Signed)
Pt last saw Tommye Standard, [place] on 08/30/20, last labs 2.04, age 87, weight 84.2kg, based on specified criteria pt is on appropriate dosage of Eliquis 2.5mg  BID for afib.  Will refill rx.

## 2020-11-24 ENCOUNTER — Ambulatory Visit: Payer: Medicare Other | Admitting: Family Medicine

## 2020-12-25 ENCOUNTER — Encounter: Payer: Self-pay | Admitting: Family Medicine

## 2020-12-25 ENCOUNTER — Other Ambulatory Visit: Payer: Self-pay

## 2020-12-25 ENCOUNTER — Ambulatory Visit (INDEPENDENT_AMBULATORY_CARE_PROVIDER_SITE_OTHER): Payer: Medicare Other | Admitting: Family Medicine

## 2020-12-25 ENCOUNTER — Other Ambulatory Visit (INDEPENDENT_AMBULATORY_CARE_PROVIDER_SITE_OTHER): Payer: Medicare Other

## 2020-12-25 DIAGNOSIS — M25569 Pain in unspecified knee: Secondary | ICD-10-CM | POA: Diagnosis not present

## 2020-12-25 DIAGNOSIS — E039 Hypothyroidism, unspecified: Secondary | ICD-10-CM

## 2020-12-25 LAB — TSH: TSH: 1.46 u[IU]/mL (ref 0.35–4.50)

## 2020-12-25 NOTE — Patient Instructions (Addendum)
We'll update you about your labs.  Update me as needed.  Try ice as needed.  5 minutes at a time with cloth between ice and skin.   3 leg exercises.   Straight leg raises, rotated leg raises, and side leg lifts.   You can try a knee sleeve if needed.   Plan on visit in summer 2022, we can do labs at the visit.

## 2020-12-25 NOTE — Progress Notes (Addendum)
Cardiology Office Note Date:  12/25/2020  Patient ID:  John Hines, John Hines 1932/06/28, MRN [id number] PCP:  Tonia Ghent, MD  Cardiologist:  Dr. Claiborne Billings Electrophysiologist: Dr. Curt Bears    Chief Complaint:   planned visit  History of Present Illness: John Hines is a 87 y.o. male with history of CKD (III), (non-functioning R kidney), HLD, HTN, LBBB, chronic CHF (systolic), NICM, gout, PAFib, SVT, stroke mentionined in his problem list, hypothyroidism  He was seen by Dr. Curt Bears during a hospital stay Oct 2020 for question of AFlutter, consult note mentions w/adenosine no reports of flutter waves were reported and suspected an ATach, during his stay he did have some PAFib, was started on amiodarone and eliquis  He last saw the EP service by A. Chalmers Cater, [place] Nov 2020, was accompanied by his daughter, reported sone "fogginess" and reported nephrology had some plans for medicine adjustments, not certain what those were. His amiodarone was reduced to maintenance of 200mg  daily,felt to be euvolemic.  He was planned to f/u with nephrology, suspected if his entresto needed reduction of discontinuation . May need to switch to imdur/hydral. Mentioned previously discussed ICD. Overall stable symptoms, and with age and co-morbidities unlikely to benefit at this time  Saw PMD via telehealth Sept 2021, remained in [place]. Labs Nov 2021, PMD addressed, no changes to his Delene Loll, synthroid was adjusted  I saw him 08/30/20 He comes with his daughter. He is doing very well No CP, palpitations or cardiac awareness. No SOB or DOE He is quite active about the house and yard, feels like he has good/stable exertional capacity  No dizzy spells, near syncope or syncope. His nephrologist retired and has a new one, can not recall the name, though still with [facility] His PMD has been following his labs, and more recently especially his TSH/thyroid, his synthroid recently adjusted again. I reduced  his amiodarone dose to 100mg  daily given his TSH  Feb TSH was down from 13.16 >> 6.82 >> Monday was 1.46 (synthroid managed with his PMD)  TODAY He is again accompanied by his daughter He is doing well Active in his garden and soon to be getting chickens to tend to He denies any CP, palpitations or cardiac awareness No dizzy spells, near syncope or syncope No bleeding or signs of bleeding  AFib Hx Diagnosed Oct 2020 AAD Amiodarone Oct 2020   Past Medical History:  Diagnosis Date  . Arthritis   . BPH (benign prostatic hyperplasia)   . CHF (congestive heart failure) ([place])    Nonischemic Cardiomyopathy. EF 25-35%  . Chronic kidney disease 05/20/2013   CKD 3  . Colon polyps   . Dyslipidemia   . Dysrhythmia 08/19/11   "low heart beat; maybe 25bpm; got RX; now 60bpm"  . ED (erectile dysfunction)   . Gout   . Heart murmur   . Hyperlipidemia   . Hypertension   . Low back pain 08/12/2013  . Nonfunctioning kidney Recognized 03/2006   Chronically and Severely Hydronephrotic and Nonfunctioning Right Kidney  . Solitary kidney Recognized 03/2006   Solitary Functioning Left Kidney  . Stroke [facility])    "mild stroke; dx'd 2010; don't know when it happened"  . Syncope and collapse 08/19/11   "shallow breathing; sweating horribly"    Past Surgical History:  Procedure Laterality Date  . CARDIOVASCULAR STRESS TEST  08/30/2010   Small to moderate fixed inferoseptal defect. Severe global hypokinesis with inferior akinesis and septal dyskinesis-likely secondary to a prominent LBBB. Non-diagnostic  for ischemia due to persantine.  Marland Kitchen KNEE ARTHROSCOPY  1996   right  . TRANSTHORACIC ECHOCARDIOGRAM  08/20/2011   EF 45-50%, severe concentric hyperthrophy.    Current Outpatient Medications  Medication Sig Dispense Refill  . spironolactone (ALDACTONE) 25 MG tablet TAKE ONE-HALF TABLET BY  MOUTH DAILY 45 tablet 3  . acetaminophen (TYLENOL) 500 MG tablet Take 500-1,000 mg by mouth every 8 (eight)  hours as needed for mild pain or headache.     Marland Kitchen amiodarone (PACERONE) 200 MG tablet Take 0.5 tablets (100 mg total) by mouth daily. 90 tablet 1  . amLODipine (NORVASC) 5 MG tablet TAKE 1 TABLET BY MOUTH  DAILY 90 tablet 3  . aspirin EC 81 MG tablet Take 81 mg by mouth at bedtime.     Marland Kitchen atorvastatin (LIPITOR) 80 MG tablet TAKE 1 TABLET BY MOUTH  DAILY 90 tablet 3  . benzonatate (TESSALON) 200 MG capsule Take 1 capsule (200 mg total) by mouth 3 (three) times daily as needed. 40 capsule 1  . carvedilol (COREG) 12.5 MG tablet TAKE 1 TABLET BY MOUTH  TWICE DAILY 180 tablet 3  . ELDERBERRY PO Take 1 tablet by mouth at bedtime.    Marland Kitchen ELIQUIS 2.5 MG TABS tablet TAKE 1 TABLET BY MOUTH  TWICE DAILY 180 tablet 2  . ENTRESTO 97-103 MG TAKE 1 TABLET BY MOUTH  TWICE DAILY 180 tablet 3  . ferrous sulfate 325 (65 FE) MG tablet TAKE 1 TABLET BY MOUTH EVERY DAY 90 tablet 2  . levothyroxine (SYNTHROID) 75 MCG tablet Take 1 tablet (75 mcg total) by mouth daily before breakfast. 90 tablet 3  . Multiple Vitamins-Minerals (ONE-A-DAY VITACRAVES IMMUNITY) CHEW Chew 2 tablets by mouth 2 (two) times a week.     . Omega-3 Fatty Acids (FISH OIL) 1000 MG CAPS Take 1,000 mg by mouth 3 (three) times a week.     . sildenafil (REVATIO) 20 MG tablet TAKE 3-5 TABLETS BY MOUTH EVERY DAY AS NEEDED ERECTILE DYSFUNCTION.  Don't take with nitroglycerin. 50 tablet 5   No current facility-administered medications for this visit.    Allergies:   Lisinopril, Nsaids, and Penicillins   Social History:  The patient  reports that he has never smoked. He has never used smokeless tobacco. He reports current alcohol use. He reports that he does not use drugs.   Family History:  The patient's family history includes Early death in his mother; Stomach cancer in his father.  ROS:  Please see the history of present illness.    All other systems are reviewed and otherwise negative.   PHYSICAL EXAM:  VS:  There were no vitals taken for this  visit. BMI: There is no height or weight on file to calculate BMI. Well nourished, well developed, in no acute distress, appears younger then his age 49: normocephalic, atraumatic Neck: no JVD, carotid bruits or masses Cardiac:  RRR; no significant murmurs, no rubs, or gallops Lungs:  CTA b/l, no wheezing, rhonchi or rales Abd: soft, nontender MS: no deformity or atrophy Ext: trace edema Skin: warm and dry, no rash Neuro:  No gross deficits appreciated Psych: euthymic mood, full affect   EKG:  Not done today  08/31/2018: TTE Study Conclusions  - Left ventricle: The cavity size was normal. There was severe  focal basal hypertrophy of the septum. Systolic function was  moderately to severely reduced. The estimated ejection fraction  was in the range of 30% to 35%. Diffuse hypokinesis. Doppler  parameters are  consistent with abnormal left ventricular  relaxation (grade 1 diastolic dysfunction).  - Aortic valve: Trileaflet; mildly thickened, mildly calcified  leaflets. There was trivial regurgitation.  - Left atrium: The atrium was mildly dilated.   Impressions:  - EF is mildly improved when compared to prior. (25%)   Recent Labs: 06/20/2020: ALT 13 08/22/2020: BUN 23; Creatinine, Ser 2.04; Hemoglobin 11.5; Platelets 182.0; Potassium 5.0; Sodium 140 10/25/2020: TSH 6.82  06/20/2020: Cholesterol 121; HDL 41.80; LDL Cholesterol 62; Total CHOL/HDL Ratio 3; Triglycerides 89.0; VLDL 17.8   CrCl cannot be calculated (Patient's most recent lab result is older than the maximum 21 days allowed.).   Wt Readings from Last 3 Encounters:  08/30/20 185 lb 9.6 oz (84.2 kg)  06/20/20 178 lb (80.7 kg)  10/05/19 170 lb 3.2 oz (77.2 kg)     Other studies reviewed: Additional studies/records reviewed today include: summarized above  ASSESSMENT AND PLAN:  1. Paroxysmal Afib     CHA2DS2Vasc is 4, on Eliquis, appropriately dosed for age/creat     No symptoms to suggest recurrence  or any significant burden     TSH back wnl     On low dose amio  Update eliquis labs  ADDEND: I have staff messaged my MA to call the patient and have him stop ASA given his Eliquis.  NOted after the patient left.   2. NICM 3. Chronic CHF, systolic     With advanced age and minimal HF symptoms, Dr. Curt Bears did not feel he was an ICD candidate     Appears compensated     On BB, entresto, aldactone  4. HTN     Looks ok    Disposition: F/u in 5mo, sooner if needed  Current medicines are reviewed at length with the patient today.  The patient did not have any concerns regarding medicines.  Venetia Night, PA-C 12/25/2020 9:01 AM     [facility] [address] [place] [address] [phone] (office)  [phone] (fax)

## 2020-12-25 NOTE — Progress Notes (Signed)
This visit occurred during the SARS-CoV-2 public health emergency.  Safety protocols were in place, including screening questions prior to the visit, additional usage of staff PPE, and extensive cleaning of exam room while observing appropriate contact time as indicated for disinfecting solutions.  Loss of appetite in the last few months. Recheck TSH pending, taking levothyroxine 75mg  a day.  Weight is stable.  Mood is good per patient report.  We talked about his mood, family thought his mood was lower, family was worried about him missing his wife.  He is looking forward to working in his garden and fishing.  He plans to get some chickens in his house in the near future.  His affect is bright today.  R knee pain.  Episodic discomfort.  More pain as getting but then better with more walking.  No locking or clicking.    Meds, vitals, and allergies reviewed.   ROS: Per HPI unless specifically indicated in ROS section   nad ncat Neck supple, no LA No TMG rrr ctab abd soft, not ttp Ext w/o edema Normal right knee range of motion with minimal crepitus.  Able to bear weight.  35 minutes were devoted to patient care in this encounter (this includes time spent reviewing the patient's file/history, interviewing and examining the patient, counseling/reviewing plan with patient).

## 2020-12-27 ENCOUNTER — Ambulatory Visit: Payer: Medicare Other | Admitting: Physician Assistant

## 2020-12-27 ENCOUNTER — Encounter: Payer: Self-pay | Admitting: Physician Assistant

## 2020-12-27 ENCOUNTER — Other Ambulatory Visit: Payer: Self-pay

## 2020-12-27 VITALS — BP 134/78 | HR 66 | Ht 66.5 in | Wt 181.0 lb

## 2020-12-27 DIAGNOSIS — I428 Other cardiomyopathies: Secondary | ICD-10-CM

## 2020-12-27 DIAGNOSIS — M25569 Pain in unspecified knee: Secondary | ICD-10-CM | POA: Insufficient documentation

## 2020-12-27 DIAGNOSIS — I480 Paroxysmal atrial fibrillation: Secondary | ICD-10-CM

## 2020-12-27 DIAGNOSIS — I5022 Chronic systolic (congestive) heart failure: Secondary | ICD-10-CM

## 2020-12-27 DIAGNOSIS — I10 Essential (primary) hypertension: Secondary | ICD-10-CM

## 2020-12-27 LAB — CBC
Hematocrit: 32.7 % — ABNORMAL LOW (ref 37.5–51.0)
Hemoglobin: 10.9 g/dL — ABNORMAL LOW (ref 13.0–17.7)
MCH: 28.5 pg (ref 26.6–33.0)
MCHC: 33.3 g/dL (ref 31.5–35.7)
MCV: 85 fL (ref 79–97)
Platelets: 190 10*3/uL (ref 150–450)
RBC: 3.83 x10E6/uL — ABNORMAL LOW (ref 4.14–5.80)
RDW: 13.3 % (ref 11.6–15.4)
WBC: 3.4 10*3/uL (ref 3.4–10.8)

## 2020-12-27 LAB — BASIC METABOLIC PANEL
BUN/Creatinine Ratio: 9 — ABNORMAL LOW (ref 10–24)
BUN: 18 mg/dL (ref 8–27)
CO2: 20 mmol/L (ref 20–29)
Calcium: 9 mg/dL (ref 8.6–10.2)
Chloride: 105 mmol/L (ref 96–106)
Creatinine, Ser: 2.03 mg/dL — ABNORMAL HIGH (ref 0.76–1.27)
Glucose: 118 mg/dL — ABNORMAL HIGH (ref 65–99)
Potassium: 4.9 mmol/L (ref 3.5–5.2)
Sodium: 141 mmol/L (ref 134–144)
eGFR: 31 mL/min/{1.73_m2} — ABNORMAL LOW (ref >59)

## 2020-12-27 NOTE — Patient Instructions (Signed)
Medication Instructions:  Your physician recommends that you continue on your current medications as directed. Please refer to the Current Medication list given to you today.  *If you need a refill on your cardiac medications before your next appointment, please call your pharmacy*   Lab Work: TODAY: CBC, BMET  If you have labs (blood work) drawn today and your tests are completely normal, you will receive your results only by: Marland Kitchen MyChart Message (if you have MyChart) OR . A paper copy in the mail If you have any lab test that is abnormal or we need to change your treatment, we will call you to review the results.   Testing/Procedures: None   Follow-Up: At [facility], you and your health needs are our priority.  As part of our continuing mission to provide you with exceptional heart care, we have created designated Provider Care Teams.  These Care Teams include your primary Cardiologist (physician) and Advanced Practice Providers (APPs -  Physician Assistants and Nurse Practitioners) who all work together to provide you with the care you need, when you need it.  We recommend signing up for the patient portal called "MyChart".  Sign up information is provided on this After Visit Summary.  MyChart is used to connect with patients for Virtual Visits (Telemedicine).  Patients are able to view lab/test results, encounter notes, upcoming appointments, etc.  Non-urgent messages can be sent to your provider as well.   To learn more about what you can do with MyChart, go to [email or web address].    Your next appointment:   6 month(s)  The format for your next appointment:   In Person  Provider:   Tommye Standard, PA-C or Allegra Lai, MD   Other Instructions None

## 2020-12-27 NOTE — Assessment & Plan Note (Signed)
Reasonable to recheck TSH given his medication use.  Continue levothyroxine for now.  See notes on labs.  He did not think his mood was worse.  He has things to look forward to, specifically fishing gardening and raising chickens.  He agreed that he would update me as needed.  We talked about depressive symptoms but he does not exhibit any obvious ones at the office visit.  No suicidal homicidal intent okay for outpatient follow-up.  He agrees with plan.

## 2020-12-27 NOTE — Assessment & Plan Note (Signed)
Likely an arthritic component with possible patellofemoral symptoms given his history.  Discussed options.  Would avoid NSAIDs. Try ice as needed.  5 minutes at a time with cloth between ice and skin.   3 leg exercises for patient to try at home: Straight leg raises, rotated leg raises, and side leg lifts.  Demonstrated and he understood. He can try a knee sleeve if needed.  He will update me as needed.

## 2020-12-28 NOTE — Addendum Note (Signed)
Addended by: Claude Manges on: 12/28/2020 08:16 AM   Modules accepted: Orders

## 2021-03-30 ENCOUNTER — Encounter (HOSPITAL_COMMUNITY): Payer: Self-pay

## 2021-03-30 ENCOUNTER — Other Ambulatory Visit: Payer: Self-pay

## 2021-03-30 ENCOUNTER — Emergency Department (HOSPITAL_COMMUNITY)
Admission: EM | Admit: 2021-03-30 | Discharge: 2021-03-30 | Disposition: A | Discharge status: Home / Self Care | Payer: Medicare Other | Attending: Emergency Medicine | Admitting: Emergency Medicine

## 2021-03-30 ENCOUNTER — Emergency Department (HOSPITAL_COMMUNITY): Payer: Medicare Other

## 2021-03-30 DIAGNOSIS — I5022 Chronic systolic (congestive) heart failure: Secondary | ICD-10-CM | POA: Diagnosis not present

## 2021-03-30 DIAGNOSIS — Z7901 Long term (current) use of anticoagulants: Secondary | ICD-10-CM | POA: Insufficient documentation

## 2021-03-30 DIAGNOSIS — Z79899 Other long term (current) drug therapy: Secondary | ICD-10-CM | POA: Diagnosis not present

## 2021-03-30 DIAGNOSIS — E785 Hyperlipidemia, unspecified: Secondary | ICD-10-CM | POA: Insufficient documentation

## 2021-03-30 DIAGNOSIS — E1169 Type 2 diabetes mellitus with other specified complication: Secondary | ICD-10-CM | POA: Diagnosis not present

## 2021-03-30 DIAGNOSIS — N183 Chronic kidney disease, stage 3 unspecified: Secondary | ICD-10-CM | POA: Insufficient documentation

## 2021-03-30 DIAGNOSIS — I130 Hypertensive heart and chronic kidney disease with heart failure and stage 1 through stage 4 chronic kidney disease, or unspecified chronic kidney disease: Secondary | ICD-10-CM | POA: Insufficient documentation

## 2021-03-30 DIAGNOSIS — U071 COVID-19: Secondary | ICD-10-CM

## 2021-03-30 DIAGNOSIS — E039 Hypothyroidism, unspecified: Secondary | ICD-10-CM | POA: Diagnosis not present

## 2021-03-30 DIAGNOSIS — R059 Cough, unspecified: Secondary | ICD-10-CM | POA: Diagnosis present

## 2021-03-30 LAB — CBC WITH DIFFERENTIAL/PLATELET
Abs Immature Granulocytes: 0.06 10*3/uL (ref 0.00–0.07)
Basophils Absolute: 0 10*3/uL (ref 0.0–0.1)
Basophils Relative: 0 %
Eosinophils Absolute: 0 10*3/uL (ref 0.0–0.5)
Eosinophils Relative: 0 %
HCT: 34 % — ABNORMAL LOW (ref 39.0–52.0)
Hemoglobin: 11.1 g/dL — ABNORMAL LOW (ref 13.0–17.0)
Immature Granulocytes: 1 %
Lymphocytes Relative: 10 %
Lymphs Abs: 0.4 10*3/uL — ABNORMAL LOW (ref 0.7–4.0)
MCH: 28.5 pg (ref 26.0–34.0)
MCHC: 32.6 g/dL (ref 30.0–36.0)
MCV: 87.2 fL (ref 80.0–100.0)
Monocytes Absolute: 1.2 10*3/uL — ABNORMAL HIGH (ref 0.1–1.0)
Monocytes Relative: 29 %
Neutro Abs: 2.5 10*3/uL (ref 1.7–7.7)
Neutrophils Relative %: 60 %
Platelets: 195 10*3/uL (ref 150–400)
RBC: 3.9 MIL/uL — ABNORMAL LOW (ref 4.22–5.81)
RDW: 14.2 % (ref 11.5–15.5)
WBC: 4.2 10*3/uL (ref 4.0–10.5)
nRBC: 0 % (ref 0.0–0.2)

## 2021-03-30 LAB — BASIC METABOLIC PANEL
Anion gap: 7 (ref 5–15)
BUN: 25 mg/dL — ABNORMAL HIGH (ref 8–23)
CO2: 23 mmol/L (ref 22–32)
Calcium: 9 mg/dL (ref 8.9–10.3)
Chloride: 111 mmol/L (ref 98–111)
Creatinine, Ser: 2.12 mg/dL — ABNORMAL HIGH (ref 0.61–1.24)
GFR, Estimated: 30 mL/min — ABNORMAL LOW (ref >60)
Glucose, Bld: 88 mg/dL (ref 70–99)
Potassium: 4.9 mmol/L (ref 3.5–5.1)
Sodium: 141 mmol/L (ref 135–145)

## 2021-03-30 LAB — TROPONIN I (HIGH SENSITIVITY): Troponin I (High Sensitivity): 12 ng/L (ref <18)

## 2021-03-30 MED ORDER — PREDNISONE 50 MG PO TABS: 50.0000 mg | ORAL_TABLET | Freq: Every day | ORAL | 0 refills | Status: DC

## 2021-03-30 MED ORDER — ALBUTEROL SULFATE HFA 108 (90 BASE) MCG/ACT IN AERS: 1.0000 | INHALATION_SPRAY | Freq: Four times a day (QID) | RESPIRATORY_TRACT | 0 refills | Status: DC | PRN

## 2021-03-30 MED ORDER — BENZONATATE 100 MG PO CAPS: 100.0000 mg | ORAL_CAPSULE | Freq: Three times a day (TID) | ORAL | 0 refills | Status: DC

## 2021-03-30 MED ORDER — MOLNUPIRAVIR EUA 200MG CAPSULE: 4.0000 | ORAL_CAPSULE | Freq: Two times a day (BID) | ORAL | 0 refills | Status: DC

## 2021-03-30 NOTE — ED Provider Notes (Signed)
Emergency Medicine Provider Triage Evaluation Note  John Hines , a 87 y.o. male  was evaluated in triage.  Pt complains of cough, sob, chest discomfort. Took a covid test and it was positive.  Review of Systems  Positive: Cough, sob, chest discomfort Negative: fever  Physical Exam  There were no vitals taken for this visit. Gen:   Awake, no distress   Resp:  Normal effort  MSK:   Moves extremities without difficulty  Other:  Lungs ctab, heart rrr  Medical Decision Making  Medically screening exam initiated at 4:32 PM.  Appropriate orders placed.  John Hines was informed that the remainder of the evaluation will be completed by another provider, this initial triage assessment does not replace that evaluation, and the importance of remaining in the ED until their evaluation is complete.     Bishop Dublin 03/30/21 1633    Milton Ferguson, MD 04/01/21 1700

## 2021-03-30 NOTE — Discharge Instructions (Addendum)
Take the medications as prescribed.  Return for new or worsening symptoms such as increased shortness of breath, vomiting, leg swelling, worsening exertional chest pain, coughing up blood

## 2021-03-30 NOTE — ED Provider Notes (Signed)
[facility] DEPT Provider Note   CSN: [id number] Arrival date & time: 03/30/21  1553     History Chief Complaint  Patient presents with   Covid Positive    ZEPPELIN COMMISSO is a 87 y.o. male with past medical history significant for CHF, BPH, CKD, hypertension, hyperlipidemia who presents for evaluation of cough.  Tested positive for COVID yesterday.  He has had a cough, sore throat, myalgias which began yesterday.  Does get some pain in his chest when he coughs.  He has no exertional or pleuritic chest pain.  No lower extremity edema.  No PND or orthopnea.  He is currently anticoagulated, has not missed any doses.  No headache, blurred vision, abdominal pain, emesis, paresthesias or weakness.  Denies additional aggravating or alleviating factors.  History obtained from patient, family in room and  past medical records.  No interpreter used.  HPI     Past Medical History:  Diagnosis Date   Arthritis    BPH (benign prostatic hyperplasia)    CHF (congestive heart failure) ([place])    Nonischemic Cardiomyopathy. EF 25-35%   Chronic kidney disease 05/20/2013   CKD 3   Colon polyps    Dyslipidemia    Dysrhythmia 08/19/11   "low heart beat; maybe 25bpm; got RX; now 60bpm"   ED (erectile dysfunction)    Gout    Heart murmur    Hyperlipidemia    Hypertension    Low back pain 08/12/2013   Nonfunctioning kidney Recognized 03/2006   Chronically and Severely Hydronephrotic and Nonfunctioning Right Kidney   Solitary kidney Recognized 03/2006   Solitary Functioning Left Kidney   Stroke [name])    "mild stroke; dx'd 2010; don't know when it happened"   Syncope and collapse 08/19/11   "shallow breathing; sweating horribly"    Patient Active Problem List   Diagnosis Date Noted   Knee pain 12/27/2020   Cough 06/21/2020   Erectile dysfunction 06/21/2020   Hypothyroidism 06/21/2020   Paroxysmal A-fib ([place]) 08/18/2019   Atrial flutter ([place]) 06/26/2019    Advance care planning 10/05/2018   Iron deficiency anemia 08/07/2017   BPH (benign prostatic hyperplasia) 03/21/2016   Lower back pain 08/12/2013   Eczema 08/12/2013   Nonfunctioning kidney    CHF (congestive heart failure) ([place])    Chronic systolic heart failure ([place]) 03/11/2013   Gout 03/11/2013   Syncope 08/21/2011   Chronic kidney disease, stage III (moderate) ([place]) 08/21/2011   Hyperglycemia 08/21/2011   Left bundle branch block 08/21/2011   Hyperlipidemia    Hypertension    Dyslipidemia     Past Surgical History:  Procedure Laterality Date   CARDIOVASCULAR STRESS TEST  08/30/2010   Small to moderate fixed inferoseptal defect. Severe global hypokinesis with inferior akinesis and septal dyskinesis-likely secondary to a prominent LBBB. Non-diagnostic for ischemia due to persantine.   KNEE ARTHROSCOPY  1996   right   TRANSTHORACIC ECHOCARDIOGRAM  08/20/2011   EF 45-50%, severe concentric hyperthrophy.       Family History  Problem Relation Age of Onset   Early death Mother        died in child birth   Stomach cancer Father        stomach    Social History   Tobacco Use   Smoking status: Never   Smokeless tobacco: Never  Vaping Use   Vaping Use: Never used  Substance Use Topics   Alcohol use: Yes    Comment: occasional   Drug use:  No    Home Medications Prior to Admission medications   Medication Sig Start Date End Date Taking? Authorizing Provider  albuterol (VENTOLIN HFA) 108 (90 Base) MCG/ACT inhaler Inhale 1-2 puffs into the lungs every 6 (six) hours as needed for wheezing or shortness of breath. 03/30/21  Yes Annita Ratliff A, PA-C  benzonatate (TESSALON) 100 MG capsule Take 1 capsule (100 mg total) by mouth every 8 (eight) hours. 03/30/21  Yes Larenzo Caples A, PA-C  molnupiravir EUA 200 mg CAPS Take 4 capsules (800 mg total) by mouth 2 (two) times daily for 5 days. 03/30/21 04/04/21 Yes Yazan Gatling A, PA-C  predniSONE (DELTASONE) 50 MG tablet Take 1  tablet (50 mg total) by mouth daily. 03/30/21  Yes Eulalia Ellerman A, PA-C  amiodarone (PACERONE) 200 MG tablet Take 0.5 tablets (100 mg total) by mouth daily. 08/31/20   Baldwin Jamaica, PA-C  amLODipine (NORVASC) 5 MG tablet TAKE 1 TABLET BY MOUTH  DAILY 07/28/20   Troy Sine, MD  atorvastatin (LIPITOR) 80 MG tablet TAKE 1 TABLET BY MOUTH  DAILY 10/26/20   Troy Sine, MD  carvedilol (COREG) 12.5 MG tablet TAKE 1 TABLET BY MOUTH  TWICE DAILY 09/13/20   Camnitz, Ocie Doyne, MD  ELIQUIS 2.5 MG TABS tablet TAKE 1 TABLET BY MOUTH  TWICE DAILY 11/14/20   Curt Bears, Ocie Doyne, MD  ENTRESTO 97-103 MG TAKE 1 TABLET BY MOUTH  TWICE DAILY 07/28/20   Troy Sine, MD  ferrous sulfate 325 (65 FE) MG tablet TAKE 1 TABLET BY MOUTH EVERY DAY 09/27/19   Tonia Ghent, MD  levothyroxine (SYNTHROID) 75 MCG tablet Take 1 tablet (75 mcg total) by mouth daily before breakfast. 10/25/20   Tonia Ghent, MD  spironolactone (ALDACTONE) 25 MG tablet TAKE ONE-HALF TABLET BY  MOUTH DAILY 11/08/20   Baldwin Jamaica, PA-C    Allergies    Lisinopril, Nsaids, and Penicillins  Review of Systems   Review of Systems  Constitutional: Negative.   HENT: Negative.    Respiratory:  Positive for cough. Negative for apnea, choking, chest tightness, shortness of breath, wheezing and stridor.   Cardiovascular:  Positive for chest pain (With cough, no exhertional, pleuritic).  Gastrointestinal: Negative.   Genitourinary: Negative.   Musculoskeletal: Negative.   Skin: Negative.   Neurological: Negative.   All other systems reviewed and are negative.  Physical Exam Updated Vital Signs BP 122/87   Pulse 88   Temp 99.9 F (37.7 C) (Oral)   Resp (!) 21   Ht 5' 7.5" (1.715 m)   Wt 78 kg   SpO2 96%   BMI 26.54 kg/m   Physical Exam Vitals and nursing note reviewed.  Constitutional:      General: He is not in acute distress.    Appearance: He is well-developed. He is not ill-appearing, toxic-appearing or  diaphoretic.  HENT:     Head: Normocephalic and atraumatic.     Nose: Nose normal.     Mouth/Throat:     Mouth: Mucous membranes are moist.  Eyes:     Pupils: Pupils are equal, round, and reactive to light.  Cardiovascular:     Rate and Rhythm: Normal rate and regular rhythm.     Pulses: Normal pulses.          Radial pulses are 2+ on the right side and 2+ on the left side.     Heart sounds: Normal heart sounds.  Pulmonary:     Effort: Pulmonary effort  is normal. No respiratory distress.     Comments: Mild coarse lung sounds.  Speaks in full sentences without difficulty Abdominal:     General: Bowel sounds are normal. There is no distension.     Palpations: Abdomen is soft.     Comments: Soft, nontender without rebound or guarding  Musculoskeletal:        General: No swelling, tenderness or deformity. Normal range of motion.     Cervical back: Normal range of motion and neck supple.     Right lower leg: No edema.     Left lower leg: No edema.     Comments: No bony tenderness.  Moves all 4 extremities without difficulty.  Compartments soft  Skin:    General: Skin is warm and dry.     Capillary Refill: Capillary refill takes less than 2 seconds.     Comments: No rashes or lesions  Neurological:     General: No focal deficit present.     Mental Status: He is alert and oriented to person, place, and time.     Comments: Cranial nerves 2-12 grossly intact Equal handgrip Intact sensation    ED Results / Procedures / Treatments   Labs (all labs ordered are listed, but only abnormal results are displayed) Labs Reviewed  CBC WITH DIFFERENTIAL/PLATELET - Abnormal; Notable for the following components:      Result Value   RBC 3.90 (*)    Hemoglobin 11.1 (*)    HCT 34.0 (*)    Lymphs Abs 0.4 (*)    Monocytes Absolute 1.2 (*)    All other components within normal limits  BASIC METABOLIC PANEL - Abnormal; Notable for the following components:   BUN 25 (*)    Creatinine, Ser 2.12  (*)    GFR, Estimated 30 (*)    All other components within normal limits  TROPONIN I (HIGH SENSITIVITY)  TROPONIN I (HIGH SENSITIVITY)    EKG None  Radiology DG Chest 1 View  Result Date: 03/30/2021 CLINICAL DATA:  87 year old male with cough. EXAM: CHEST  1 VIEW COMPARISON:  Chest radiograph dated 07/11/2019. FINDINGS: No focal consolidation, pleural effusion or pneumothorax the cardiac silhouette is within limits. Atherosclerotic calcification the aortic arch. No acute osseous pathology. IMPRESSION: No active disease. Electronically Signed   By: Anner Crete M.D.   On: 03/30/2021 19:19    Procedures Procedures   Medications Ordered in ED Medications - No data to display  ED Course  I have reviewed the triage vital signs and the nursing notes.  Pertinent labs & imaging results that were available during my care of the patient were reviewed by me and considered in my medical decision making (see chart for details).  87 year old here for evaluation of positive home COVID test.  He is afebrile, nonseptic, not ill-appearing.  Heart clear.  Does have some mild coarse lung sounds however no tachycardia, tachypnea or hypoxia.  Does not appear grossly fluid overloaded on exam.  No PND orthopnea.  Low suspicion for CHF exacerbation.  Speaks in full sentences, no respiratory distress.  Abdomen soft, nontender.  He is nonfocal neuro exam without deficits.  He appears well-hydrated.  He has no neck stiffness or neck rigidity.  He has no meningismus.  Does have some chest pain when he coughs however this is nonpleuritic, nonexertional nature, only occurs when he coughs.  He has not missed any doses of his anticoagulant.  Symptoms do not seem consistent with ACS, PE, dissection.  Work-up started from  triage which I personally reviewed and interpreted:  CBC without leukocytosis, hemoglobin 11.1 Troponin 12, given timing, history of symptoms low suspicion for ischemia Metabolic panel with  creatinine 2.12 similar to prior EKG without ischemic changes DG chest without cardiomegaly, pulm edema, pneumothorax  Patient overall reassuring exam.  I have low suspicion for acute bacterial infectious process.  Symptoms likely related to his recent positive COVID test.  Will treat symptomatically.  Discussed antiviral medication with family.  Cannot do Paxlovid  due to multiple contraindicated medications. Can do Molnupiravir. Rx with symptomatic management.   The patient has been appropriately medically screened and/or stabilized in the ED. I have low suspicion for any other emergent medical condition which would require further screening, evaluation or treatment in the ED or require inpatient management.  Patient is hemodynamically stable and in no acute distress.  Patient able to ambulate in department prior to ED.  Evaluation does not show acute pathology that would require ongoing or additional emergent interventions while in the emergency department or further inpatient treatment.  I have discussed the diagnosis with the patient and answered all questions.  Pain is been managed while in the emergency department and patient has no further complaints prior to discharge.  Patient is comfortable with plan discussed in room and is stable for discharge at this time.  I have discussed strict return precautions for returning to the emergency department.  Patient was encouraged to follow-up with PCP/specialist refer to at discharge.     MDM Rules/Calculators/A&P                           Final Clinical Impression(s) / ED Diagnoses Final diagnoses:  COVID    Rx / DC Orders ED Discharge Orders          Ordered    predniSONE (DELTASONE) 50 MG tablet  Daily        03/30/21 2251    benzonatate (TESSALON) 100 MG capsule  Every 8 hours        03/30/21 2251    albuterol (VENTOLIN HFA) 108 (90 Base) MCG/ACT inhaler  Every 6 hours PRN        03/30/21 2251    molnupiravir EUA 200 mg CAPS  2 times  daily        03/30/21 2251             Zehava Turski A, PA-C 03/31/21 0010    Horton, Alvin Critchley, DO 03/31/21 0123

## 2021-03-30 NOTE — ED Triage Notes (Addendum)
Patient reports that he had a Covid positive home test today. Patient c/o cough, sore throat, body aches since yesterday. Patient states chest pain only when he coughs

## 2021-04-01 ENCOUNTER — Telehealth: Payer: Self-pay | Admitting: Family Medicine

## 2021-04-01 NOTE — Telephone Encounter (Signed)
Please check on patient.  Recently COVID-positive, started on antivirals.  Thanks.

## 2021-04-02 NOTE — Telephone Encounter (Signed)
If any distress, then rec ER.   Try to clarify if true vertigo or lightheaded.  See if the can update [place] about his BP and pulse.  If low BP and not vertigo, we may need to temporarily hold a BP med.  Thanks.

## 2021-04-02 NOTE — Telephone Encounter (Signed)
Patient's daughter, Marijean Heath, states patient was diagnosed with COVID on 03/30/21. Patient is doing ok besides having dizziness-not sure if it is like the room is spinning or he is lightheaded, daughter is not with him right now. Patient does not complain of anything else. He is just dizzy and can not walk good due to this. They wonder if it is the Paxlovid giving that symptom. Some chest pain with coughing. Patient is scheduled for virtual visit with Dr Damita Dunnings tomorrow. No openings today.  I sent Leta and patient (who was added to the call) to access triage nurse. And sending this message to Dr Damita Dunnings also.

## 2021-04-02 NOTE — Telephone Encounter (Signed)
Pt and 2 of pts daughters are on speaker phone; pt has been experiencing dizziness where the room is spinning when pt is moving about and especially if walking. Pt has prod cough with yellow phlegm; SOB only when coughing. Pt seen ED on 03/30/21 and pt tested + on 03/30/21 for covid. Pt started on molnupiravir by ED. One of pts daughters said that pt is taking paxlovid because that was why stopped the atorvastatin and one other med she could not remember name of. Pts daughter read off the bottle Lagevrio which is Molnupiravir. Pt's daughter said she knows it is not Dr Damita Dunnings or [facility] fault but she is upset due to fact that she understood from ED that pt would be on paxlovid. I apologized and pts daughter said she again knows that it is not our fault but is sure what she was told at ED and I voiced understanding and that the daughters as well as DR Damita Dunnings and our office want the best for the pt and understand they are concerned about their father. Pts BP now is 119/68 P 66. Pt is not having any N&V, diarrhea, chest pain and no fever. Pt is presently taking prednisone 50 mg one daily for 5 days. Pts daughter stopped tessalon after talking to a nurse and went back to OTC robitussin DM which seems to be helping cough somewhat. Pt states he is in no distress. Pt did not take molnupiravir last night or this morning and pt still has dizziness but is not nearly as bad. Dr Damita Dunnings said to hold the molnupiravir both doses today and call in AM with update before taking the molnupiravir again. Dr Damita Dunnings said could be the medicine or could be pt is sick with covid or could be combination of both factors. UC & ED precautions given and pts daughter Marijean Heath voiced understanding of all instructions. Leta wants to know if pt cannot take Paxlovid and molnupiravir and pts symptoms started on 03/30/21 would the infusion be an option for the pt. Pt already has video visit scheduled on 04/03/21 at 3PM with Dr Damita Dunnings. Sending note to Dr  Damita Dunnings.

## 2021-04-02 NOTE — Telephone Encounter (Signed)
Called patient daughter  reviewed all information and repeated back to me. Will call if any questions.

## 2021-04-02 NOTE — Telephone Encounter (Signed)
[facility] - Client TELEPHONE ADVICE RECORD [facility] Patient Name: John Hines Gender: Male DOB: 1931-12-17 Age: 87 Y 2 M 17 D Return Phone Number: [phone] (Primary), [phone] (Secondary) Address[place]  [address] Client [facility] - Client Client Site [facility] Physician Renford Dills - MD Contact Type Call Who Is Calling Patient / Member / Family / Caregiver Call Type Triage / Clinical Caller Name Leta Relationship To Patient Daughter Return Phone Number [phone] (Primary) Chief Complaint Dizziness Reason for Call Symptomatic / Request for Health Information Initial Comment The caller states that her father is experiencing dizziness. The caller reports that he was recently diagnosed with COVID-19 and is currently on Paxlovid. Translation No Nurse Assessment Nurse: Eugenio Hoes, RN, Jenny Reichmann Date/Time (Eastern Time): 04/01/2021 3:54:09 PM Confirm and document reason for call. If symptomatic, describe symptoms. ---The caller states that her father is experiencing dizziness. The caller reports that he was recently diagnosed with COVID-19 and is currently on Paxlovid that he has been taking for 2 days. He is able to walk, but he gets more dizzy when he cough. Had a fever a couple days ago. Does the patient have any new or worsening symptoms? ---Yes Will a triage be completed? ---Yes Related visit to physician within the last 2 weeks? ---Yes Does the PT have any chronic conditions? (i.e. diabetes, asthma, this includes High risk factors for pregnancy, etc.) ---Yes List chronic conditions. ---HTN, Kidney disease, high cholesterol Is this a behavioral health or substance abuse call? ---No Guidelines Guideline Title Affirmed Question Affirmed Notes Nurse Date/Time (Eastern Time) Dizziness - Lightheadedness [1] MODERATE dizziness  (e.g., interferes with normal activities) AND [2] has NOT Eugenio Hoes, RN, Jenny Reichmann 04/01/2021 4:01:12 PM PLEASE NOTE: All timestamps contained within this report are represented as [place] Standard Time. CONFIDENTIALTY NOTICE: This fax transmission is intended only for the addressee. It contains information that is legally privileged, confidential or otherwise protected from use or disclosure. If you are not the intended recipient, you are strictly prohibited from reviewing, disclosing, copying using or disseminating any of this information or taking any action in reliance on or regarding this information. If you have received this fax in error, please notify us immediately by telephone so that we can arrange for its return to [place]. Phone: [phone], Toll-Free: [phone], Fax: [phone] Page: 2 of 2 Call Id: [id number] Guidelines Guideline Title Affirmed Question Affirmed Notes Nurse Date/Time Eilene Ghazi Time) been evaluated by physician for this (Exception: dizziness caused by heat exposure, sudden standing, or poor fluid intake) Disp. Time Eilene Ghazi Time) Disposition Final User 04/01/2021 4:09:09 PM See PCP within 24 Hours Yes Eugenio Hoes, RN, Jenny Reichmann Caller Disagree/Comply Comply Caller Understands Yes PreDisposition Did not know what to do Care Advice Given Per Guideline SEE PCP WITHIN 24 HOURS: * IF OFFICE WILL BE OPEN: You need to be examined within the next 24 hours. Call your doctor (or NP/PA) when the office opens and make an appointment. DRINK FLUIDS: * Drink several glasses of fruit juice, other clear fluids or water. * This will improve hydration and blood glucose. * If the weather is hot or you have a fever, make sure the fluids are cold. * You become worse * Passes out (faints) CALL BACK IF: CARE ADVICE given per Dizziness (Adult) guideline. LIE DOWN AND REST: * Lie down with feet elevated for 1 hour. * This will improve circulation and increase blood flow to the  brain.  Comments User: Baird Cancer, RN Date/Time Eilene Ghazi Time): 04/01/2021 4:01:08 PM he is having to take 4 pills of paxlovid during the day and 4 at night. Bp is currently125/62 User: Baird Cancer, RN Date/Time Eilene Ghazi Time): 04/01/2021 4:02:07 PM HR 68 Referrals REFERRED TO PCP OFFICE

## 2021-04-02 NOTE — Telephone Encounter (Signed)
We'll talk about this at the visit tomorrow.  Other treatment plans would depend on his condition tomorrow.  I'll check on options in the meantime but he should still in the window for treatment.  With sx starting on 03/30/21, then today is day #3.

## 2021-04-02 NOTE — Telephone Encounter (Signed)
[place] [facility] - Client TELEPHONE ADVICE RECORD [facility] Patient Name: John Hines Gender: Male DOB: Jan 16, 1932 Age: 87 Y 2 M 18 D Return Phone Number: [phone] (Primary), [phone] (Secondary) Address: [place] [address] Client [facility] [place] [facility] - Client Client Site [place] Physician Renford Dills - MD Contact Type Call Who Is Calling Patient / Member / Family / Caregiver Call Type Triage / Clinical Caller Name Leta Relationship To Patient Daughter Return Phone Number [phone] (Secondary) Chief Complaint CHEST PAIN - pain, pressure, heaviness or tightness Reason for Call Symptomatic / Request for [place] states her daughter was diagnosed with COVID and he is experiencing dizziness, a cough and chest pains. [place] ED Translation No Nurse Assessment Nurse: Harvie Bridge, RN, Beth Date/Time (Eastern Time): 04/02/2021 9:55:54 AM Confirm and document reason for call. If symptomatic, describe symptoms. ---Caller was diagnosed with COVID July 8th/Friday, and he is experiencing dizziness when walking, a cough and chest pains only when cough. Denies fever. WAs in the ER Friday. Does the patient have any new or worsening symptoms? ---Yes Will a triage be completed? ---Yes Related visit to physician within the last 2 weeks? ---Yes Does the PT have any chronic conditions? (i.e. diabetes, asthma, this includes High risk factors for pregnancy, etc.) ---Yes List chronic conditions. ---HTN, Only 1 kidney, Cardiac: Defibulator Is this a behavioral health or substance abuse call? ---No Guidelines Guideline Title Affirmed Question Affirmed Notes Nurse Date/Time (Eastern Time) COVID-19 - Diagnosed or Suspected MODERATE difficulty breathing (e.g., speaks in phrases, SOB even at rest, pulse  100-120) Newhart, RN, [facility] 04/02/2021 9:58:57 AM PLEASE NOTE: All timestamps contained within this report are represented as [place] Standard Time. CONFIDENTIALTY NOTICE: This fax transmission is intended only for the addressee. It contains information that is legally privileged, confidential or otherwise protected from use or disclosure. If you are not the intended recipient, you are strictly prohibited from reviewing, disclosing, copying using or disseminating any of this information or taking any action in reliance on or regarding this information. If you have received this fax in error, please notify us immediately by telephone so that we can arrange for its return to [place]. Phone: [phone], Toll-Free: [phone], Fax: [phone] Page: 2 of 2 Call Id: [id number] [place]. Time Eilene Ghazi Time) Disposition Final User 04/02/2021 10:00:09 AM Go to ED Now Yes Newhart, RN, Beth Caller Disagree/Comply Comply Caller Understands Yes PreDisposition InappropriateToAsk Care Advice Given Per Guideline GO TO ED NOW: * You need to be seen in the Emergency Department. YOU SHOULD TELL HEALTHCARE PERSONNEL THAT YOU MIGHT HAVE COVID-19: * Tell the first healthcare worker you meet that you may have COVID-19. WEAR A MASK - COVER YOUR MOUTH AND NOSE: * Wear a mask that fits snuggly over your mouth and nose. ANOTHER ADULT SHOULD DRIVE: * It is better and safer if another adult drives instead of you. CALL EMS 911 IF: * Severe difficulty breathing occurs * Lips or face turns blue * Confusion occurs. CARE ADVICE given per COVID-19 - DIAGNOSED OR SUSPECTED (Adult) guideline. Referrals GO TO FACILITY OTHER - SPECIFY

## 2021-04-03 ENCOUNTER — Encounter: Payer: Self-pay | Admitting: Family Medicine

## 2021-04-03 ENCOUNTER — Other Ambulatory Visit: Payer: Self-pay | Admitting: Family Medicine

## 2021-04-03 ENCOUNTER — Telehealth (INDEPENDENT_AMBULATORY_CARE_PROVIDER_SITE_OTHER): Payer: Medicare Other | Admitting: Family Medicine

## 2021-04-03 DIAGNOSIS — U071 COVID-19: Secondary | ICD-10-CM

## 2021-04-03 NOTE — Telephone Encounter (Signed)
Will d/w pt about potential bebtelovimab use at visit.  Thanks.

## 2021-04-03 NOTE — Progress Notes (Signed)
Orders pending in anticipation of VV today.   John Hines  ============== Addendum 3:29 PM  D/w pt about options.  Clearly improved and wouldn't need infusion tx at this point.  Orders cancelled.  See VV note.  John Hines

## 2021-04-03 NOTE — Progress Notes (Signed)
Virtual visit completed through WebEx or similar program Patient location: home  Provider location: [place] at [facility], office  Participants: Patient and me (unless stated otherwise below)  Pandemic considerations d/w pt.   Limitations and rationale for visit method d/w patient.  Patient agreed to proceed.   CC: covid.    HPI:  Covid positive.  He had been vaccinated.  He was diffusely sore and coughing.  Had temp 101, HA.  Vertigo started with molnupiravir and better off med.    No fevers now.  "I'm doing fine."  He went out and mowed his grass this AM.  He couldn't have done that a few days ago.  He didn't lose his sense of smell and taste.  No CP. Rare cough (only twice today), rare sputum.  He clearly feels better.  Sx started 03/29/21.  We talked about the rationale for molnupiravir instead of Paxil of it.  We talked about potentially using an infusion since he is less than 7 days into the process but since he clearly feels so much better in the meantime it would not be worth the risk of an adverse reaction.  Meds and allergies reviewed.   ROS: Per HPI unless specifically indicated in ROS section   NAD Speech wnl Well-appearing  A/P: Covid.  Clearly better. Update me as needed. Wouldn't need infusion tx at this point, discussed above.  Vertigo resolved.  I presume this was related to molnupiravir.  I would not expect clinical regression but if he has changes then I want him to update me.  He agrees to plan.

## 2021-04-04 DIAGNOSIS — U071 COVID-19: Secondary | ICD-10-CM | POA: Insufficient documentation

## 2021-04-04 NOTE — Assessment & Plan Note (Signed)
  Covid.  Clearly better. Update me as needed. Wouldn't need infusion tx at this point, discussed above.  Vertigo resolved.  I presume this was related to molnupiravir.  I would not expect clinical regression but if he has changes then I want him to update me.  He agrees to plan.

## 2021-04-06 ENCOUNTER — Telehealth: Payer: Self-pay | Admitting: *Deleted

## 2021-04-06 MED ORDER — GUAIFENESIN-CODEINE 100-10 MG/5ML PO SOLN: 5.0000 mL | Freq: Two times a day (BID) | ORAL | 0 refills | Status: DC | PRN

## 2021-04-06 NOTE — Telephone Encounter (Signed)
Ms John Hines advised. She said thank you so much.

## 2021-04-06 NOTE — Telephone Encounter (Signed)
Marijean Heath (daughter) left voicemail on triage line.  States her dads cough has gotten worse and requested a call back.  I spoke with Leta.  He dad tested positive for Covid.  Had video visit with Dr. Damita Dunnings on 04/03/2021.  She states his cough has gotten worse and he is very tired.  She is asking for a cough suppressant.  She also wants [place] to check with Dr. Damita Dunnings to see if it is too late for the infusion.  She thinks her dad has underestimated his Covid symptoms.  Mr. Alipio symptoms started last Friday. She is on her way to his house now. Called Mr. Townley and his daughter answered the phone.  She states he is saying all he needs is a cough suppressant.  She states he refuses to go to hospital and does not want the infusion.  Will forward message to Dr. Damita Dunnings to send in cough syrup.  [facility] [place]

## 2021-04-06 NOTE — Telephone Encounter (Signed)
Pt daughter called her father has developed a bad cough and wanted to know if something could be called in since he had a VV on 7/12

## 2021-04-06 NOTE — Telephone Encounter (Signed)
Rx sent.  If progressive sx/not improving, then let me know/get rechecked.  Thanks.

## 2021-05-07 ENCOUNTER — Telehealth: Payer: Self-pay | Admitting: Family Medicine

## 2021-05-07 NOTE — Telephone Encounter (Signed)
John Hines daughter Marijean Heath called in and wanted to know about getting the polio booster for her dad and wanted to know what to do.

## 2021-05-08 NOTE — Telephone Encounter (Signed)
Please confirm but I expect this is about covid booster and the answer here applies to that vaccine, not polio.  He shouldn't need a polio booster.   He has had 3 doses of covid vaccine and recently had covid.  He could either get the second booster now or wait to see what is available with a newer booster in the fall.  Either would be okay.  Thanks.

## 2021-05-08 NOTE — Telephone Encounter (Signed)
John Hines advised. She was asking about Polio booster. Advised as instructed by Dr Damita Dunnings

## 2021-05-08 NOTE — Telephone Encounter (Signed)
Left message for John Hines to call back to discuss

## 2021-07-09 ENCOUNTER — Telehealth: Payer: Self-pay | Admitting: Family Medicine

## 2021-07-09 NOTE — Telephone Encounter (Signed)
Yes, thanks. Would get a flu shot too.

## 2021-07-09 NOTE — Telephone Encounter (Signed)
Pt daughter called wanting to know if its ok for pt to get a covid booster shot  with his heart and kidney disease.

## 2021-07-10 NOTE — Telephone Encounter (Signed)
LMTCB

## 2021-07-11 NOTE — Telephone Encounter (Signed)
LMTCB

## 2021-07-16 NOTE — Telephone Encounter (Signed)
We should defer to patient preference on this, since he is able to make his own decisions.   The standard advice would be to get the flu and covid booster but he doesn't have to do them at the same time.  He could do them separately in either order.    I think it would be incorrect for me to recommend one at the expense of the other.    Thanks.

## 2021-07-16 NOTE — Telephone Encounter (Signed)
I spoke with John Hines pts daughter (see DPR) and advised per Dr Josefine Class instruction. John Hines said that pt does not want to take both the covid booster and the flu shot. Pt got [place] covid vaccine on 10/25/19, 11/15/19 and 08/14/20. On 06/20/20 pt got high dose 65+ flu shot. John Hines request cb after Dr Damita Dunnings reviews and decides which he considers more important for pt to take the covid booster or the flu shot. Please advise.

## 2021-07-17 NOTE — Telephone Encounter (Signed)
Pt and Leta pts daughter both on speaker phone and notified as instructed and both voiced understanding. Marijean Heath said would discuss with pt when gets off phone. Pt request to schedule flushot at flushot clinic on 07/27/21 at [place] office. Scheduled as requested and nothing further needed at this time. No covid symptoms or exposure noted per pt.

## 2021-07-23 ENCOUNTER — Telehealth: Payer: Self-pay | Admitting: Family Medicine

## 2021-07-23 MED ORDER — APIXABAN 2.5 MG PO TABS: 2.5000 mg | ORAL_TABLET | Freq: Two times a day (BID) | ORAL | 0 refills | Status: DC

## 2021-07-23 NOTE — Telephone Encounter (Signed)
Spoke with patients daughter and she stated patient only needs rx sent in for 1 week supply. Rx has been sent.

## 2021-07-23 NOTE — Telephone Encounter (Signed)
Pt daughter called in wanting to know if there was any samples that can be given for a couple of days because the medication is to expensive. Eliquis medication

## 2021-07-23 NOTE — Telephone Encounter (Signed)
  Encourage patient to contact the pharmacy for refills or they can request refills through [place]:  Please schedule appointment if longer than 1 year  NEXT APPOINTMENT DATE:11/4  MEDICATION:Eliquis  Is the patient out of medication? yes  [facility] [place]  Let patient know to contact pharmacy at the end of the day to make sure medication is ready.  Please notify patient to allow 48-72 hours to process  CLINICAL FILLS OUT ALL BELOW:   LAST REFILL:  QTY:  REFILL DATE:    OTHER COMMENTS:    Okay for refill?  Please advise

## 2021-07-23 NOTE — Telephone Encounter (Signed)
Called and spoke with patient's daughter and advised we do not have any sample medications in the office to give to patients. Marijean Heath stated that they are working on getting the prescription assistance reinstated this week.

## 2021-07-27 ENCOUNTER — Ambulatory Visit (INDEPENDENT_AMBULATORY_CARE_PROVIDER_SITE_OTHER): Payer: Medicare Other

## 2021-07-27 ENCOUNTER — Other Ambulatory Visit: Payer: Self-pay

## 2021-07-27 DIAGNOSIS — Z23 Encounter for immunization: Secondary | ICD-10-CM | POA: Diagnosis not present

## 2021-08-01 ENCOUNTER — Ambulatory Visit: Payer: Medicare Other

## 2021-08-19 ENCOUNTER — Other Ambulatory Visit: Payer: Self-pay | Admitting: Physician Assistant

## 2021-08-19 ENCOUNTER — Other Ambulatory Visit: Payer: Self-pay | Admitting: Cardiology

## 2021-08-20 ENCOUNTER — Ambulatory Visit: Payer: Medicare Other

## 2021-08-21 ENCOUNTER — Ambulatory Visit: Payer: Medicare Other | Attending: Internal Medicine

## 2021-08-21 ENCOUNTER — Telehealth: Payer: Self-pay | Admitting: Family Medicine

## 2021-08-21 ENCOUNTER — Other Ambulatory Visit (HOSPITAL_BASED_OUTPATIENT_CLINIC_OR_DEPARTMENT_OTHER): Payer: Self-pay

## 2021-08-21 DIAGNOSIS — Z23 Encounter for immunization: Secondary | ICD-10-CM

## 2021-08-21 MED ORDER — PFIZER COVID-19 VAC BIVALENT 30 MCG/0.3ML IM SUSP
INTRAMUSCULAR | 0 refills | Status: DC
  Filled 2021-08-21: qty 0.3, 1d supply, fill #0

## 2021-08-21 NOTE — Progress Notes (Signed)
   Covid-19 Vaccination Clinic  Name:  John Hines    MRN: [id number] DOB: Oct 10, 1931  08/21/2021  Mr. Skalla was observed post Covid-19 immunization for 15 minutes without incident. He was provided with Vaccine Information Sheet and instruction to access the V-Safe system.   Mr. Baby was instructed to call 911 with any severe reactions post vaccine: Difficulty breathing  Swelling of face and throat  A fast heartbeat  A bad rash all over body  Dizziness and weakness   Immunizations Administered     Name Date Dose VIS Date Route   Pfizer Covid-19 Vaccine Bivalent Booster 08/21/2021 10:23 AM 0.3 mL 05/23/2021 Intramuscular   Manufacturer: [place]   Lot: [id number]   [place]: [phone]

## 2021-08-21 NOTE — Telephone Encounter (Signed)
Pt daughter called asking if Dr Damita Dunnings can call something in for pt joint pain.

## 2021-08-22 NOTE — Telephone Encounter (Signed)
Called and spoke with patients daughter and advised patient will need an appt before medications can be prescribed. She is going to talk to her dad to see when he can come in and call back to scheduled.

## 2021-08-24 NOTE — Telephone Encounter (Signed)
Scheduled appt for 08/27/21

## 2021-08-27 ENCOUNTER — Other Ambulatory Visit: Payer: Self-pay

## 2021-08-27 ENCOUNTER — Encounter: Payer: Self-pay | Admitting: Family Medicine

## 2021-08-27 ENCOUNTER — Ambulatory Visit (INDEPENDENT_AMBULATORY_CARE_PROVIDER_SITE_OTHER): Payer: Medicare Other | Admitting: Family Medicine

## 2021-08-27 ENCOUNTER — Ambulatory Visit (INDEPENDENT_AMBULATORY_CARE_PROVIDER_SITE_OTHER)
Admission: RE | Admit: 2021-08-27 | Discharge: 2021-08-27 | Disposition: A | Discharge status: Home / Self Care | Payer: Medicare Other | Source: Ambulatory Visit | Attending: Family Medicine | Admitting: Family Medicine

## 2021-08-27 VITALS — BP 120/62 | HR 66 | Temp 97.0°F | Ht 67.5 in | Wt 171.0 lb

## 2021-08-27 DIAGNOSIS — M25561 Pain in right knee: Secondary | ICD-10-CM | POA: Diagnosis not present

## 2021-08-27 DIAGNOSIS — G8929 Other chronic pain: Secondary | ICD-10-CM

## 2021-08-27 DIAGNOSIS — M25519 Pain in unspecified shoulder: Secondary | ICD-10-CM

## 2021-08-27 NOTE — Patient Instructions (Signed)
Use the shoulder exercises and let me know if those aren't helping.  Go to xray on the way out.   If you have mychart we'll likely use that to update you.    Use a knee sleep, ice your knee for 5 minutes at a time, and take tylenol up to 3 times a day if needed for pain.  Don't take ibuprofen or aleve.   Take care.  Glad to see you.

## 2021-08-27 NOTE — Progress Notes (Signed)
This visit occurred during the SARS-CoV-2 public health emergency.  Safety protocols were in place, including screening questions prior to the visit, additional usage of staff PPE, and extensive cleaning of exam room while observing appropriate contact time as indicated for disinfecting solutions.   R knee pain.  H/o scope in 1996.  Pain most of the time, esp when moving to get up.  Normal ROM when not putting pressure on the joint.  No locking or sticking but feels weak.  No falls.  No intervention yet.  He isn't taking nsaids- cautions d/w pt.    Episodic L vs R shoulder pain.  Started in L shoulder years ago, returned in the last year.  Not having pain every day.  Pain putting on his coat.  Some pain sleeping on his side.  L>R pain with overhead movement.  Normal grip B.    Meds, vitals, and allergies reviewed.   ROS: Per HPI unless specifically indicated in ROS section   Nad Ncat Neck supple, no LA Rrr Ctab B shoulders w/o arm drop.  R shoulder pain with int rotation- mild L shoulder pain with both int and ext (>R side) L scap manipulation decreases pain with external rotation. Distally neurovascular intact with normal gross motor exam in the upper extremities otherwise. Right knee with ACL MCL and LCL intact but crepitus noted on range of motion.  Medial lateral joint line nontender.  30 minutes were devoted to patient care in this encounter (this includes time spent reviewing the patient's file/history, interviewing and examining the patient, counseling/reviewing plan with patient).

## 2021-08-29 DIAGNOSIS — M25519 Pain in unspecified shoulder: Secondary | ICD-10-CM | POA: Insufficient documentation

## 2021-08-29 NOTE — Assessment & Plan Note (Signed)
Discussed options.  Likely from arthritis.  Avoid NSAIDs Use a knee sleep, ice the knee for 5 minutes at a time, and take tylenol up to 3 times a day if needed for pain.  See notes on imaging.

## 2021-08-29 NOTE — Assessment & Plan Note (Signed)
Likely rotator cuff findings.  Left greater than right shoulder.  Discussed options.  Home exercises discussed with patient.  Handout given to patient, exercises demonstrated.  Questions answered.  Since he does have decreased pain in the left shoulder with scapular manipulation it is reasonable to try home exercise program and he will update me as needed.  See after visit summary.

## 2021-09-05 ENCOUNTER — Telehealth: Payer: Self-pay | Admitting: Family Medicine

## 2021-09-05 NOTE — Telephone Encounter (Signed)
Pt daughter called stating that you spoke with her yesterday giving her pt results. Pt daughter would like a call back so you can give results to pt and also had some follow up questions. Please advise.

## 2021-09-05 NOTE — Telephone Encounter (Signed)
Spoke with patient and daughter; see results note.

## 2021-10-02 ENCOUNTER — Other Ambulatory Visit: Payer: Self-pay | Admitting: Cardiology

## 2021-10-15 ENCOUNTER — Telehealth: Payer: Self-pay | Admitting: *Deleted

## 2021-10-15 NOTE — Telephone Encounter (Signed)
Please see if they want a short term rx sent to local pharmacy.  Let me know.   Rx had come through cardiology prior.  I routed this to cards also about addressing the long term rx. Thanks.

## 2021-10-15 NOTE — Telephone Encounter (Signed)
PLEASE NOTE: All timestamps contained within this report are represented as [place] Standard Time. CONFIDENTIALTY NOTICE: This fax transmission is intended only for the addressee. It contains information that is legally privileged, confidential or otherwise protected from use or disclosure. If you are not the intended recipient, you are strictly prohibited from reviewing, disclosing, copying using or disseminating any of this information or taking any action in reliance on or regarding this information. If you have received this fax in error, please notify us immediately by telephone so that we can arrange for its return to [place]. Phone: [phone], Toll-Free: [phone], Fax: [phone] Page: 1 of 4 Call Id: [id number] [place] Night - Client >>>Contains Verbal Order - Signature Required<<< TELEPHONE ADVICE RECORD [facility] Patient Name: John Hines Gender: Male DOB: 07-Feb-1932 Age: 88 Y 67 M 29 D Return Phone Number: [phone] (Primary), [phone] (Secondary) Address: [place]  [address] Client [place] Night - Client Client Site [place] Provider Renford Dills - MD Contact Type Call Who Is Calling Patient / Member / Family / Caregiver Call Type Triage / Clinical Caller Name Zada Girt Relationship To Patient Daughter Return Phone Number [phone] (Secondary) Chief Complaint Prescription Refill or Medication Request (non symptomatic) Reason for Call Medication Question / Request Initial Comment Caller states her father has ran out of his medication. Entresto prescription has expired that normally comes in the mail. Translation No Nurse Assessment Nurse: Tressia Danas, RN, [place] Date/Time (Eastern Time): 10/13/2021 2:51:20 PM Confirm and document reason for call. If symptomatic, describe symptoms. ---Caller states her father has ran out of his  medication. Entresto prescription has expired that normally comes in the mail. caller states he gets his medication from [facility]. states he takes it twice a day and he ran out today. caller states he feels fine. caller states she has been in contact the the mail order and the prescription is expired. caller states the Mail order will send request for refill on Monday. caller states [facility] will not be able to get his medication to him until at least Thursday. caller is concerned. Does the patient have any new or worsening symptoms? ---No Guidelines Guideline Title Affirmed Question Affirmed Notes Nurse Date/Time (Eastern Time) Blood Pressure - High Ran out of BP medications McClarnon, RN, [facility] 10/13/2021 3:01:15 PM Disp. Time Eilene Ghazi Time) Disposition Final User 10/13/2021 2:03:17 PM Send To Nurse Lonia Farber, RN, Greg 10/13/2021 2:39:25 PM Attempt made - no message left McClarnon, RN, [facility] 10/13/2021 2:39:41 PM Attempt made - message left McClarnon, RN, Earnest Bailey PLEASE NOTE: All timestamps contained within this report are represented as [place] Standard Time. CONFIDENTIALTY NOTICE: This fax transmission is intended only for the addressee. It contains information that is legally privileged, confidential or otherwise protected from use or disclosure. If you are not the intended recipient, you are strictly prohibited from reviewing, disclosing, copying using or disseminating any of this information or taking any action in reliance on or regarding this information. If you have received this fax in error, please notify us immediately by telephone so that we can arrange for its return to [place]. Phone: [phone], Toll-Free: [phone], Fax: [phone] Page: 2 of 4 Call Id: [id number] [place]. Time Eilene Ghazi Time) Disposition Final User 10/13/2021 3:03:06 PM Paged On Call back to [facility], [place], [facility] 10/13/2021 3:32:35 PM Pharmacy Call Tressia Danas, [place] Reason[facility]'s Entresto 97/103. Pharmacist states the  minimum they can fill for is I bottle. They do not open bottles. they will give him the months worth of medication. 10/13/2021 3:01:43 PM Call PCP within 24 Hours Yes McClarnon, RN, [employer] Disagree/Comply Comply Caller Understands Yes PreDisposition Did not know what to do Care Advice Given Per Guideline CALL PCP WITHIN 24 HOURS: * IF OFFICE WILL BE CLOSED: I'll page the on-call provider now. EXCEPTION: from 9 pm to 9 am. Since this isn't urgent, we'll hold the page until morning. CARE ADVICE given per High Blood Pressure (Adult) guideline. * You become worse CALL BACK IF: * Weakness or numbness of the face, arm or leg on one side of the body occurs * Difficulty walking, difficulty talking, or severe headache occurs * Chest pain or difficulty breathing occurs Verbal Orders/Maintenance Medications Medication Refill Route Dosage Regime Duration Admin Instructions User Name Delene Loll Yes Oral 97/103 mg 1 Weeks take twice a day McClarnon, RN, [employer] Comments User: Dennard Nip, RN Date/Time (Eastern Time): 10/13/2021 2:40:39 PM primary number had an immediate busy signal then on redial x2 call can not be completed as dialed. Will have lead PC check the number message left on secondary number User: Dennard Nip, RN Date/Time Eilene Ghazi Time): 10/13/2021 3:33:07 PM called at let the caller know [facility]. daughter states understanding Paging [facility] Phone DateTime Result/ Outcome Message Type Notes Carolann Littler MD [phone] 10/13/2021 3:03:06 PM Paged On Call Back to Call Center Doctor Paged Please call Blaine, RN @ [facility] [phone]. Thank you Carolann Littler - MD 10/13/2021 3:22:03 PM Spoke with On Call - General Message Result reported to Dr that pt is out of his Entresto 97/103. states they get his medication from PLEASE NOTE: All timestamps contained within this report  are represented as [place] Standard Time. CONFIDENTIALTY NOTICE: This fax transmission is intended only for the addressee. It contains information that is legally privileged, confidential or otherwise protected from use or disclosure. If you are not the intended recipient, you are strictly prohibited from reviewing, disclosing, copying using or disseminating any of this information or taking any action in reliance on or regarding this information. If you have received this fax in error, please notify us immediately by telephone so that we can arrange for its return to [place]. Phone: [phone], Toll-Free: [phone], Fax: [phone] Page: 3 of 4 Call Id: [id number] Paging DoctorName Phone DateTime Result/ Outcome Message Type Notes [facility] but they states his rx has expired and will send a request for refill on Monday and his medication will be there on Thursday. Is it ok for him to go without his medication until Thursday or can I call in small amount to cover him until he gets his medication from the mail order. Md states nurse may call in a 1 week supply for this pt to a local pharmacy of choice. PLEASE NOTE: All timestamps contained within this report are represented as [place] Standard Time. CONFIDENTIALTY NOTICE: This fax transmission is intended only for the addressee. It contains information that is legally privileged, confidential or otherwise protected from use or disclosure. If you are not the intended recipient, you are strictly prohibited from reviewing, disclosing, copying using or disseminating any of this information or taking any action in reliance on or regarding this information. If you have received this fax in error, please notify us immediately by telephone so that we can arrange for its return to [place]. Phone: [phone], Toll-Free: [phone], Fax: [phone] Page: 4 of 4 Call Id: [id number]  [facility] >>>Contains Verbal Order - Signature Required<<< [address] [place] [address] [phone] Fax: [phone] [place] Night - Client [place] - Night Date: 10/13/2021 From: [facility] Department To: Renford Dills - MD Please sign the order for the approved drug(s) given by our call center nurse on your behalf.  Fax to [phone] within 5 business days. Thank you. Date Eilene Ghazi Time): 10/13/2021 12:58:34 PM Triage RN: Dennard Nip, RN NAME: Myrtie Hawk PHONE NUMBER: [phone] (Primary), [phone] (Secondary) BIRTHDATE: [birth year] [place] [address] CALLER: Daughter NAME: Zada Girt Rx Given Medication Ordering Physician Refill Route Dosage Regime Duration Admin Instructions User Name Rayvon Char, MD Yes Oral 97/103 mg 1 Weeks take twice a day McClarnon, RN, Earnest Bailey MD Signature Date

## 2021-10-15 NOTE — Telephone Encounter (Signed)
Spoke with patients daughter and she was able to get a refill on the rx already. Will still need cardio to refill the long term rx to mail order pharmacy.

## 2021-10-16 ENCOUNTER — Other Ambulatory Visit: Payer: Self-pay | Admitting: Cardiovascular Disease

## 2021-10-16 ENCOUNTER — Other Ambulatory Visit: Payer: Self-pay | Admitting: Physician Assistant

## 2021-10-31 ENCOUNTER — Other Ambulatory Visit: Payer: Self-pay | Admitting: Family Medicine

## 2021-10-31 DIAGNOSIS — E039 Hypothyroidism, unspecified: Secondary | ICD-10-CM

## 2021-11-06 ENCOUNTER — Telehealth: Payer: Self-pay | Admitting: Family Medicine

## 2021-11-06 NOTE — Telephone Encounter (Signed)
LVM for pt to rtn my call to schedule AWV with [facility]. Please schedule AWV if pt calls the office  

## 2021-11-08 ENCOUNTER — Other Ambulatory Visit: Payer: Self-pay

## 2021-11-19 ENCOUNTER — Telehealth: Payer: Self-pay | Admitting: Cardiology

## 2021-11-19 ENCOUNTER — Telehealth: Payer: Self-pay | Admitting: Family Medicine

## 2021-11-19 NOTE — Telephone Encounter (Signed)
°*  STAT* If patient is at the pharmacy, call can be transferred to refill team.   1. Which medications need to be refilled? (please list name of each medication and dose if known) ENTRESTO 97-103 MG  2. Which pharmacy/location (including street and city if local pharmacy) is medication to be sent to? [place] ([facility] ) - [place], [place]  3. Do they need a 30 day or 90 day supply? 90 day   Patient has an appointment 6/13   Patient calling the office for samples of medication:   1.  What medication and dosage are you requesting samples for? ENTRESTO 97-103 MG  2.  Are you currently out of this medication? No, has 2 left but refill won't arrive in time

## 2021-11-19 NOTE — Telephone Encounter (Signed)
This is Dr. Kelly's pt. °

## 2021-11-19 NOTE — Telephone Encounter (Signed)
°  Encourage patient to contact the pharmacy for refills or they can request refills through [facility]:  Please schedule appointment if longer than 1 year  NEXT APPOINTMENT DATE:  MEDICATION: ENTRESTO 97-103 MG  Is the patient out of medication? yes   PHARMACY[facility]   Let patient know to contact pharmacy at the end of the day to make sure medication is ready.  Please notify patient to allow 48-72 hours to process  CLINICAL FILLS OUT ALL BELOW:   LAST REFILL:  QTY:  REFILL DATE:    OTHER COMMENTS:    Okay for refill?  Please advise

## 2021-11-19 NOTE — Telephone Encounter (Signed)
Looks like this medication was prescribed by patients cardiologist. I called patients daughter and advised refill will need to come from them. She is going to call and see if they can get the refill from the cardiologist; if she can not she will call back to see if Dr. Damita Dunnings will take over the refills.

## 2021-11-20 ENCOUNTER — Other Ambulatory Visit: Payer: Self-pay | Admitting: *Deleted

## 2021-11-20 MED ORDER — ENTRESTO 97-103 MG PO TABS: 1.0000 | ORAL_TABLET | Freq: Two times a day (BID) | ORAL | 3 refills | Status: DC

## 2021-11-20 MED ORDER — ENTRESTO 97-103 MG PO TABS: 1.0000 | ORAL_TABLET | Freq: Two times a day (BID) | ORAL | 0 refills | Status: DC

## 2021-11-20 NOTE — Telephone Encounter (Signed)
Refills has been sent to both the local and mail in pharmacies.

## 2021-11-29 ENCOUNTER — Other Ambulatory Visit: Payer: Self-pay

## 2021-11-29 ENCOUNTER — Ambulatory Visit: Payer: Medicare Other | Admitting: Cardiology

## 2021-11-29 ENCOUNTER — Encounter: Payer: Self-pay | Admitting: Cardiology

## 2021-11-29 VITALS — BP 144/60 | HR 60 | Ht 66.0 in | Wt 176.2 lb

## 2021-11-29 DIAGNOSIS — I480 Paroxysmal atrial fibrillation: Secondary | ICD-10-CM

## 2021-11-29 DIAGNOSIS — D6869 Other thrombophilia: Secondary | ICD-10-CM | POA: Diagnosis not present

## 2021-11-29 DIAGNOSIS — Z79899 Other long term (current) drug therapy: Secondary | ICD-10-CM

## 2021-11-29 DIAGNOSIS — I5022 Chronic systolic (congestive) heart failure: Secondary | ICD-10-CM

## 2021-11-29 NOTE — Patient Instructions (Signed)
Medication Instructions:  ?Your physician recommends that you continue on your current medications as directed. Please refer to the Current Medication list given to you today. ? ?*If you need a refill on your cardiac medications before your next appointment, please call your pharmacy* ? ? ?Lab Work: ?Amiodarone & Eliquis surveillance lab work today: CMET, TSH and CBC ?If you have labs (blood work) drawn today and your tests are completely normal, you will receive your results only by: ?MyChart Message (if you have MyChart) OR ?A paper copy in the mail ?If you have any lab test that is abnormal or we need to change your treatment, we will call you to review the results. ? ? ?Testing/Procedures: ?None ordered ? ? ?Follow-Up: ?At [facility], you and your health needs are our priority.  As part of our continuing mission to provide you with exceptional heart care, we have created designated Provider Care Teams.  These Care Teams include your primary Cardiologist (physician) and Advanced Practice Providers (APPs -  Physician Assistants and Nurse Practitioners) who all work together to provide you with the care you need, when you need it. ? ?We recommend signing up for the patient portal called "MyChart".  Sign up information is provided on this After Visit Summary.  MyChart is used to connect with patients for Virtual Visits (Telemedicine).  Patients are able to view lab/test results, encounter notes, upcoming appointments, etc.  Non-urgent messages can be sent to your provider as well.   ?To learn more about what you can do with MyChart, go to [email or web address].   ? ?Your next appointment:   ?6 month(s) ? ?The format for your next appointment:   ?In Person ? ?Provider:   ?Tommye Standard, PA-C ? ? ? ?Thank you for choosing [facility]!! ? ? ?Trinidad Curet, RN ?([phone] ? ?

## 2021-11-29 NOTE — Progress Notes (Signed)
? ?Electrophysiology Office Note ? ? ?Date:  11/29/2021  ? ?ID:  John Hines, DOB [birth year], MRN [id number] ? ?PCP:  Tonia Ghent, MD  ?Cardiologist:  Claiborne Billings ?Primary Electrophysiologist:  Braxley Balandran Meredith Leeds, MD   ? ?No chief complaint on file. ? ?  ?History of Present Illness: ?John Hines is a 88 y.o. male who is being seen today for the evaluation of CHF at the request of Tonia Ghent, MD. Presenting today for electrophysiology evaluation.   ? ?He has a history significant for remote right cerebellar infarction, hyperlipidemia, hypertension.  He has a nonischemic cardiomyopathy with an ejection fraction of 30 to 35%.  He was diagnosed with atrial fibrillation and is currently on Eliquis and amiodarone. ? ?Today, denies symptoms of palpitations, chest pain, shortness of breath, orthopnea, PND, lower extremity edema, claudication, dizziness, presyncope, syncope, bleeding, or neurologic sequela. The patient is tolerating medications without difficulties.  Currently he feels well.  He is able to do all of his daily activities.  He is overall happy with his control of atrial fibrillation.  He states that he does get tired sometimes when he exerts himself, but rests and quickly feels better. ? ? ?Past Medical History:  ?Diagnosis Date  ? Arthritis   ? BPH (benign prostatic hyperplasia)   ? CHF (congestive heart failure) ([place])   ? Nonischemic Cardiomyopathy. EF 25-35%  ? Chronic kidney disease 05/20/2013  ? CKD 3  ? Colon polyps   ? Dyslipidemia   ? Dysrhythmia 08/19/11  ? "low heart beat; maybe 25bpm; got RX; now 60bpm"  ? ED (erectile dysfunction)   ? Gout   ? Heart murmur   ? Hyperlipidemia   ? Hypertension   ? Low back pain 08/12/2013  ? Nonfunctioning kidney Recognized 03/2006  ? Chronically and Severely Hydronephrotic and Nonfunctioning Right Kidney  ? Solitary kidney Recognized 03/2006  ? Solitary Functioning Left Kidney  ? Stroke [facility])   ? "mild stroke; dx'd 2010; don't know when it happened"  ?  Syncope and collapse 08/19/11  ? "shallow breathing; sweating horribly"  ? ?Past Surgical History:  ?Procedure Laterality Date  ? CARDIOVASCULAR STRESS TEST  08/30/2010  ? Small to moderate fixed inferoseptal defect. Severe global hypokinesis with inferior akinesis and septal dyskinesis-likely secondary to a prominent LBBB. Non-diagnostic for ischemia due to persantine.  ? KNEE ARTHROSCOPY  1996  ? right  ? TRANSTHORACIC ECHOCARDIOGRAM  08/20/2011  ? EF 45-50%, severe concentric hyperthrophy.  ? ? ? ?Current Outpatient Medications  ?Medication Sig Dispense Refill  ? albuterol (VENTOLIN HFA) 108 (90 Base) MCG/ACT inhaler Inhale 1-2 puffs into the lungs every 6 (six) hours as needed for wheezing or shortness of breath. 8 g 0  ? amiodarone (PACERONE) 200 MG tablet Take 0.5 tablets (100 mg total) by mouth daily. Please make yearly appt with Dr. Curt Bears for April 2023 for future refills. Thank you 1st attempt 15 tablet 3  ? amLODipine (NORVASC) 5 MG tablet TAKE 1 TABLET BY MOUTH  DAILY 90 tablet 3  ? apixaban (ELIQUIS) 2.5 MG TABS tablet Take 1 tablet (2.5 mg total) by mouth 2 (two) times daily. 14 tablet 0  ? atorvastatin (LIPITOR) 80 MG tablet TAKE 1 TABLET BY MOUTH  DAILY 90 tablet 0  ? carvedilol (COREG) 12.5 MG tablet TAKE 1 TABLET BY MOUTH TWICE  DAILY 60 tablet 3  ? ferrous sulfate 325 (65 FE) MG tablet TAKE 1 TABLET BY MOUTH EVERY DAY 90 tablet 2  ? levothyroxine (SYNTHROID)  75 MCG tablet TAKE 1 TABLET BY MOUTH DAILY BEFORE BREAKFAST. 90 tablet 3  ? sacubitril-valsartan (ENTRESTO) 97-103 MG Take 1 tablet by mouth 2 (two) times daily. 60 tablet 3  ? spironolactone (ALDACTONE) 25 MG tablet Take 0.5 tablets (12.5 mg total) by mouth daily. Please schedule appointment for future refills. Thank you 45 tablet 0  ? ?No current facility-administered medications for this visit.  ? ? ?Allergies:   Lisinopril, Molnupiravir, Nsaids, and Penicillins  ? ?Social History:  The patient  reports that he has never smoked. He has  never used smokeless tobacco. He reports current alcohol use. He reports that he does not use drugs.  ? ?Family History:  The patient's family history includes Early death in his mother; Stomach cancer in his father.  ? ?ROS:  Please see the history of present illness.   Otherwise, review of systems is positive for none.   All other systems are reviewed and negative.  ? ?PHYSICAL EXAM: ?VS:  BP (!) 144/60   Pulse 60   Ht 5' 6" (1.676 m)   Wt 176 lb 3.2 oz (79.9 kg)   SpO2 98%   BMI 28.44 kg/m?  , BMI Body mass index is 28.44 kg/m?. ?GEN: Well nourished, well developed, in no acute distress  ?HEENT: normal  ?Neck: no JVD, carotid bruits, or masses ?Cardiac: RRR; no murmurs, rubs, or gallops,no edema  ?Respiratory:  clear to auscultation bilaterally, normal work of breathing ?GI: soft, nontender, nondistended, + BS ?MS: no deformity or atrophy  ?Skin: warm and dry ?Neuro:  Strength and sensation are intact ?Psych: euthymic mood, full affect ? ?EKG:  EKG is ordered today. ?Personal review of the ekg ordered shows sinus rhythm, PVC, IVCD ? ?Recent Labs: ?12/25/2020: TSH 1.46 ?03/30/2021: BUN 25; Creatinine, Ser 2.12; Hemoglobin 11.1; Platelets 195; Potassium 4.9; Sodium 141  ? ? ?Lipid Panel  ?   ?Component Value Date/Time  ? CHOL 121 06/20/2020 1454  ? CHOL 134 07/09/2018 1403  ? TRIG 89.0 06/20/2020 1454  ? HDL 41.80 06/20/2020 1454  ? HDL 49 07/09/2018 1403  ? CHOLHDL 3 06/20/2020 1454  ? VLDL 17.8 06/20/2020 1454  ? LDLCALC 62 06/20/2020 1454  ? LDLCALC 76 07/09/2018 1403  ? LDLCALC 70 08/07/2017 0830  ? ? ? ?Wt Readings from Last 3 Encounters:  ?11/29/21 176 lb 3.2 oz (79.9 kg)  ?08/27/21 171 lb (77.6 kg)  ?03/30/21 172 lb (78 kg)  ?  ? ? ?Other studies Reviewed: ?Additional studies/ records that were reviewed today include: TTE 08/31/18  ?Review of the above records today demonstrates:  ?- Left ventricle: The cavity size was normal. There was severe ?  focal basal hypertrophy of the septum. Systolic function was ?   moderately to severely reduced. The estimated ejection fraction ?  was in the range of 30% to 35%. Diffuse hypokinesis. Doppler ?  parameters are consistent with abnormal left ventricular ?  relaxation (grade 1 diastolic dysfunction). ?- Aortic valve: Trileaflet; mildly thickened, mildly calcified ?  leaflets. There was trivial regurgitation. ?- Left atrium: The atrium was mildly dilated. ? ? ?ASSESSMENT AND PLAN: ? ?1.  Chronic systolic and diastolic heart failure: Currently on optimal medical therapy with Entresto and Coreg.  No obvious volume overload.  Currently feeling well.  Will continue with current management ? ?2.  Hypertension: Mildly elevated today.  He has not taken his medications.  He does normal at home. ? ?3.  Hyperlipidemia: Continue Lipitor ? ?4.  Paroxysmal atrial fibrillation: CHA2DS2-VASc   of 4.  Currently on Eliquis 5 mg twice daily, amiodarone 100 mg daily.  We [place] check labs today for high risk medication monitoring of amiodarone.  Remains in sinus rhythm.  No changes. ? ?5.  Secondary hypercoagulable state: Currently on Eliquis for atrial fibrillation as above. ? ?Current medicines are reviewed at length with the patient today.   ?The patient does not have concerns regarding his medicines.  The following changes were made today: None ? ?Labs/ tests ordered today include:  ?Orders Placed This Encounter  ?Procedures  ? Comp Met (CMET)  ? TSH  ? CBC  ? EKG 12-Lead  ? ? ? ?Disposition:   FU 6 months ? ?Signed, ?Shalon Councilman Meredith Leeds, MD  ?11/29/2021 8:51 AM    ? ?[facility] ?[address] ?[address] ?[place] [address] ?[phone] (office) ?[phone] (fax) ? ?

## 2021-12-03 ENCOUNTER — Other Ambulatory Visit: Payer: Self-pay | Admitting: Family Medicine

## 2021-12-03 DIAGNOSIS — D509 Iron deficiency anemia, unspecified: Secondary | ICD-10-CM

## 2021-12-03 DIAGNOSIS — M1000 Idiopathic gout, unspecified site: Secondary | ICD-10-CM

## 2021-12-03 DIAGNOSIS — E785 Hyperlipidemia, unspecified: Secondary | ICD-10-CM

## 2021-12-04 ENCOUNTER — Other Ambulatory Visit (INDEPENDENT_AMBULATORY_CARE_PROVIDER_SITE_OTHER): Payer: Medicare Other

## 2021-12-04 ENCOUNTER — Other Ambulatory Visit: Payer: Self-pay

## 2021-12-04 DIAGNOSIS — D509 Iron deficiency anemia, unspecified: Secondary | ICD-10-CM

## 2021-12-04 DIAGNOSIS — E785 Hyperlipidemia, unspecified: Secondary | ICD-10-CM

## 2021-12-04 DIAGNOSIS — M1000 Idiopathic gout, unspecified site: Secondary | ICD-10-CM | POA: Diagnosis not present

## 2021-12-04 LAB — URIC ACID: Uric Acid, Serum: 9 mg/dL — ABNORMAL HIGH (ref 4.0–7.8)

## 2021-12-04 LAB — COMPREHENSIVE METABOLIC PANEL
ALT: 7 IU/L (ref 0–44)
AST: 12 IU/L (ref 0–40)
Albumin/Globulin Ratio: 2 (ref 1.2–2.2)
Albumin: 4.5 g/dL (ref 3.6–4.6)
Alkaline Phosphatase: 129 IU/L — ABNORMAL HIGH (ref 44–121)
BUN/Creatinine Ratio: 11 (ref 10–24)
BUN: 22 mg/dL (ref 8–27)
Bilirubin Total: 0.4 mg/dL (ref 0.0–1.2)
CO2: 21 mmol/L (ref 20–29)
Calcium: 9.7 mg/dL (ref 8.6–10.2)
Chloride: 104 mmol/L (ref 96–106)
Creatinine, Ser: 1.99 mg/dL — ABNORMAL HIGH (ref 0.76–1.27)
Globulin, Total: 2.2 g/dL (ref 1.5–4.5)
Glucose: 127 mg/dL — ABNORMAL HIGH (ref 70–99)
Potassium: 5.1 mmol/L (ref 3.5–5.2)
Sodium: 141 mmol/L (ref 134–144)
Total Protein: 6.7 g/dL (ref 6.0–8.5)
eGFR: 32 mL/min/{1.73_m2} — ABNORMAL LOW (ref >59)

## 2021-12-04 LAB — CBC
Hematocrit: 33.5 % — ABNORMAL LOW (ref 37.5–51.0)
Hemoglobin: 11.2 g/dL — ABNORMAL LOW (ref 13.0–17.7)
MCH: 27.9 pg (ref 26.6–33.0)
MCHC: 33.4 g/dL (ref 31.5–35.7)
MCV: 84 fL (ref 79–97)
Platelets: 199 10*3/uL (ref 150–450)
RBC: 4.01 x10E6/uL — ABNORMAL LOW (ref 4.14–5.80)
RDW: 13.6 % (ref 11.6–15.4)
WBC: 3.6 10*3/uL (ref 3.4–10.8)

## 2021-12-04 LAB — LIPID PANEL
Cholesterol: 113 mg/dL (ref 0–200)
HDL: 37.3 mg/dL — ABNORMAL LOW (ref >39.00)
LDL Cholesterol: 62 mg/dL (ref 0–99)
NonHDL: 75.63
Total CHOL/HDL Ratio: 3
Triglycerides: 66 mg/dL (ref 0.0–149.0)
VLDL: 13.2 mg/dL (ref 0.0–40.0)

## 2021-12-04 LAB — IRON: Iron: 53 ug/dL (ref 42–165)

## 2021-12-04 LAB — TSH: TSH: 3.35 u[IU]/mL (ref 0.450–4.500)

## 2021-12-04 MED ORDER — APIXABAN 2.5 MG PO TABS: 2.5000 mg | ORAL_TABLET | Freq: Two times a day (BID) | ORAL | 3 refills | Status: DC

## 2021-12-10 ENCOUNTER — Other Ambulatory Visit: Payer: Self-pay | Admitting: Cardiology

## 2021-12-11 ENCOUNTER — Other Ambulatory Visit: Payer: Self-pay

## 2021-12-11 ENCOUNTER — Encounter: Payer: Self-pay | Admitting: Family Medicine

## 2021-12-11 ENCOUNTER — Ambulatory Visit (INDEPENDENT_AMBULATORY_CARE_PROVIDER_SITE_OTHER): Payer: Medicare Other | Admitting: Family Medicine

## 2021-12-11 VITALS — BP 124/60 | HR 65 | Temp 97.2°F | Ht 66.0 in | Wt 175.0 lb

## 2021-12-11 DIAGNOSIS — I10 Essential (primary) hypertension: Secondary | ICD-10-CM | POA: Diagnosis not present

## 2021-12-11 DIAGNOSIS — N183 Chronic kidney disease, stage 3 unspecified: Secondary | ICD-10-CM

## 2021-12-11 DIAGNOSIS — E039 Hypothyroidism, unspecified: Secondary | ICD-10-CM | POA: Diagnosis not present

## 2021-12-11 DIAGNOSIS — D509 Iron deficiency anemia, unspecified: Secondary | ICD-10-CM | POA: Diagnosis not present

## 2021-12-11 DIAGNOSIS — I739 Peripheral vascular disease, unspecified: Secondary | ICD-10-CM

## 2021-12-11 DIAGNOSIS — Z0000 Encounter for general adult medical examination without abnormal findings: Secondary | ICD-10-CM | POA: Diagnosis not present

## 2021-12-11 DIAGNOSIS — Z7189 Other specified counseling: Secondary | ICD-10-CM

## 2021-12-11 NOTE — Patient Instructions (Addendum)
Check to make sure you are still taking ferrous sulfate/iron.  Either way, let me know.  If you need a refill, let me know.   ? ?Take care.  Glad to see you. ?Update me as needed.   ?

## 2021-12-11 NOTE — Progress Notes (Signed)
This visit occurred during the SARS-CoV-2 public health emergency.  Safety protocols were in place, including screening questions prior to the visit, additional usage of staff PPE, and extensive cleaning of exam room while observing appropriate contact time as indicated for disinfecting solutions. ? ?I have personally reviewed the Medicare Annual Wellness questionnaire and have noted ?1. The patient's medical and social history ?2. Their use of alcohol, tobacco or illicit drugs ?3. Their current medications and supplements ?4. The patient's functional ability including ADL's, fall risks, home safety risks and hearing or visual ?            impairment. ?5. Diet and physical activities ?6. Evidence for depression or mood disorders ? ?The patients weight, height, BMI have been recorded in the chart and visual acuity is per eye clinic.  ?I have made referrals, counseling and provided education to the patient based review of the above and I have provided the pt with a written personalized care plan for preventive services. ? ?Provider list updated- see scanned forms.  Routine anticipatory guidance given to patient.  See health maintenance. The possibility exists that previously documented standard health maintenance information may have been brought forward from a previous encounter into this note.  If needed, that same information has been updated to reflect the current situation based on today's encounter.   ? ?Flu previously done ?Shingles discussed with patient ?PNA up-to-date ?Tetanus 2007. ?COVID-vaccine previously done ?Colon cancer screening deferred given his age.  He agrees. ?Prostate cancer screening deferred given his age.  He agrees to ?Advance directive-daughter designated if patient were incapacitated. ?Cognitive function addressed- see scanned forms- and if abnormal then additional documentation follows.  ? ?In addition to [facility]ness, follow up visit for the below conditions: ? ?PAD previously  noted on leg x-ray.  No PAD sx.  W/o sx, would defer w/u at this point given his Cr/anticoagulation. No leg or chest pain.  He agrees.  Discussed risk factor modification by keeping his blood pressure and lipids controlled. ? ?Hypertension:    ?Using medication without problems or lightheadedness: yes ?Chest pain with exertion:no ?Edema:no ?Short of breath: no ? ?No gout sx.   ? ?His iron level still normal.  I asked him to verify his current iron dosing at home.  See after visit summary. ? ?Elevated Cholesterol: ?Using medications without problems:yes ?Muscle aches: no ?Diet compliance: yes ?Exercise: yes ? ?Still anticoagulated.  No bleeding.  Compliant. ? ?CKD.  Per renal clinic.  Avoids nsaids.  D/w pt about labs.   ? ?Hypothyroidism.  No neck mass, no dysphagia.  Labs d/w pt.  Compliant.   ? ?PMH and SH reviewed ? ?Meds, vitals, and allergies reviewed.  ? ?ROS: Per HPI.  Unless specifically indicated otherwise in HPI, the patient denies: ? ?General: fever. ?Eyes: acute vision changes ?ENT: sore throat ?Cardiovascular: chest pain ?Respiratory: SOB ?GI: vomiting ?GU: dysuria ?Musculoskeletal: acute back pain ?Derm: acute rash ?Neuro: acute motor dysfunction ?Psych: worsening mood ?Endocrine: polydipsia ?Heme: bleeding ?Allergy: hayfever ? ?GEN: nad, alert and oriented ?HEENT: ncat ?NECK: supple w/o LA ?CV: rrr. ?PULM: ctab, no inc wob ?ABD: soft, +bs ?EXT: no edema ?SKIN: Well-perfused. ?

## 2021-12-12 ENCOUNTER — Telehealth: Payer: Self-pay | Admitting: Family Medicine

## 2021-12-12 ENCOUNTER — Telehealth: Payer: Self-pay

## 2021-12-12 DIAGNOSIS — I739 Peripheral vascular disease, unspecified: Secondary | ICD-10-CM | POA: Insufficient documentation

## 2021-12-12 DIAGNOSIS — Z0000 Encounter for general adult medical examination without abnormal findings: Secondary | ICD-10-CM | POA: Insufficient documentation

## 2021-12-12 NOTE — Telephone Encounter (Signed)
Dr. Damita Dunnings asked family to get recent results and notes from [facility]. When they were there for last visit two weeks ago requested that they send to [place]. Office states that we need to send request on our letterhead in order to have have them fax notes and results. We can fax that request.  ? ?Fax: [phone] ?

## 2021-12-12 NOTE — Assessment & Plan Note (Signed)
Continue amlodipine carvedilol and Entresto.  Continue spironolactone.  Previous labs discussed with patient. ?

## 2021-12-12 NOTE — Assessment & Plan Note (Signed)
Flu previously done ?Shingles discussed with patient ?PNA up-to-date ?Tetanus 2007. ?COVID-vaccine previously done ?Colon cancer screening deferred given his age.  He agrees. ?Prostate cancer screening deferred given his age.  He agrees to ?Advance directive-daughter designated if patient were incapacitated. ?Cognitive function addressed- see scanned forms- and if abnormal then additional documentation follows.  ?

## 2021-12-12 NOTE — Assessment & Plan Note (Signed)
No neck mass, no dysphagia.  Labs d/w pt.  Compliant.  Continue levothyroxine. ?

## 2021-12-12 NOTE — Assessment & Plan Note (Signed)
Per renal clinic.  He avoids NSAIDs.  Labs discussed with patient. ?

## 2021-12-12 NOTE — Telephone Encounter (Signed)
Please verify current iron/ferrous sulfate use with patient.  He was going to check on this.  Either way, let me know.  If he is still taking iron then I would continue as is.  Thanks. ?

## 2021-12-12 NOTE — Assessment & Plan Note (Signed)
PAD previously noted on leg x-ray.  No PAD sx.  W/o sx, would defer w/u at this point given his Cr/anticoagulation. No leg or chest pain.  He agrees.  Discussed risk factor modification by keeping his blood pressure and lipids controlled.  No claudication. ?

## 2021-12-12 NOTE — Assessment & Plan Note (Signed)
Advance directive- daughter designated if patient were incapacitated.   

## 2021-12-12 NOTE — Assessment & Plan Note (Signed)
?  His iron level still normal.  I asked him to verify his current iron dosing at home.  See after visit summary. ?

## 2021-12-14 ENCOUNTER — Ambulatory Visit (INDEPENDENT_AMBULATORY_CARE_PROVIDER_SITE_OTHER): Payer: Medicare Other | Admitting: Family Medicine

## 2021-12-14 ENCOUNTER — Other Ambulatory Visit: Payer: Self-pay

## 2021-12-14 ENCOUNTER — Encounter: Payer: Self-pay | Admitting: Family Medicine

## 2021-12-14 VITALS — BP 100/60 | HR 65 | Temp 98.2°F | Ht 67.0 in | Wt 172.0 lb

## 2021-12-14 DIAGNOSIS — S30861A Insect bite (nonvenomous) of abdominal wall, initial encounter: Secondary | ICD-10-CM

## 2021-12-14 DIAGNOSIS — L089 Local infection of the skin and subcutaneous tissue, unspecified: Secondary | ICD-10-CM

## 2021-12-14 DIAGNOSIS — L03313 Cellulitis of chest wall: Secondary | ICD-10-CM | POA: Diagnosis not present

## 2021-12-14 DIAGNOSIS — W57XXXA Bitten or stung by nonvenomous insect and other nonvenomous arthropods, initial encounter: Secondary | ICD-10-CM

## 2021-12-14 MED ORDER — DOXYCYCLINE HYCLATE 100 MG PO TABS: 100.0000 mg | ORAL_TABLET | Freq: Two times a day (BID) | ORAL | 0 refills | Status: AC

## 2021-12-14 NOTE — Progress Notes (Signed)
? ?Subjective:  ? ?  ?John Hines is a 88 y.o. male presenting for Insect Bite (Tick bite on L flank on Monday, pulled off Tuesday. Denies any fever, nausea/vomiting ) ?  ? ? ?HPI ? ?#Tick bite ?- small brown ?- still small when removed ?- was on his body overnight ?- had several bites ?- removed on the left side ?- has 3 different bites ?- no rash ?- no fever, chills, body aches ?- no n/v ?- removed early on Tuesday morning 6:30 am ?- occasionally itchy and painful  ? ?Review of Systems ? ? ?Social History  ? ?Tobacco Use  ?Smoking Status Never  ?Smokeless Tobacco Never  ? ? ? ?   ?Objective:  ?  ?BP Readings from Last 3 Encounters:  ?12/14/21 100/60  ?12/11/21 124/60  ?11/29/21 (!) 144/60  ? ?Wt Readings from Last 3 Encounters:  ?12/14/21 172 lb (78 kg)  ?12/11/21 175 lb (79.4 kg)  ?11/29/21 176 lb 3.2 oz (79.9 kg)  ? ? ?BP 100/60   Pulse 65   Temp 98.2 ?F (36.8 ?C) (Oral)   Ht '5\' 7"'$  (1.702 m)   Wt 172 lb (78 kg)   SpO2 99%   BMI 26.94 kg/m?  ? ? ?Physical Exam ?Constitutional:   ?   Appearance: Normal appearance. He is not ill-appearing or diaphoretic.  ?HENT:  ?   Right Ear: External ear normal.  ?   Left Ear: External ear normal.  ?   Nose: Nose normal.  ?Eyes:  ?   General: No scleral icterus. ?   Extraocular Movements: Extraocular movements intact.  ?   Conjunctiva/sclera: Conjunctivae normal.  ?Cardiovascular:  ?   Rate and Rhythm: Normal rate and regular rhythm.  ?   Heart sounds: No murmur heard. ?Pulmonary:  ?   Effort: Pulmonary effort is normal. No respiratory distress.  ?   Breath sounds: Normal breath sounds. No wheezing.  ?Musculoskeletal:  ?   Cervical back: Neck supple.  ?Skin: ?   General: Skin is warm and dry.  ?   Comments: 3 insect bite areas. The one of the left flank area is warm, erythematous and indurated. No fluctuance.   ?Neurological:  ?   Mental Status: He is alert. Mental status is at baseline.  ?Psychiatric:     ?   Mood and Affect: Mood normal.  ? ? ? ? ? ?   ?Assessment  & Plan:  ? ?Problem List Items Addressed This Visit   ?None ?Visit Diagnoses   ? ? Cellulitis of chest wall    -  Primary  ? Relevant Medications  ? doxycycline (VIBRA-TABS) 100 MG tablet  ? Infected tick bite of abdominal wall, initial encounter      ? Relevant Medications  ? doxycycline (VIBRA-TABS) 100 MG tablet  ? ?  ? ?Concern for localized cellulitis at site of insect bite.  Given that patient removed a tick we will treat with doxycycline to cover for any possible tickborne illness, however do suspect overall that this is low concern given short duration and lack of other symptoms. ? ?Discussed rashes for tickborne illness and return precautions. ? ?Return if symptoms worsen or fail to improve. ? ?Lesleigh Noe, MD ? ?This visit occurred during the SARS-CoV-2 public health emergency.  Safety protocols were in place, including screening questions prior to the visit, additional usage of staff PPE, and extensive cleaning of exam room while observing appropriate contact time as indicated for disinfecting solutions.  ? ?

## 2021-12-14 NOTE — Telephone Encounter (Signed)
LMTCB; there is also another TE to address for this patient as well. ?

## 2021-12-14 NOTE — Telephone Encounter (Signed)
I have the notes and labs from Dr. Curt Bears.  This has been addressed.  Thanks.  ?

## 2021-12-14 NOTE — Patient Instructions (Signed)
Tick bite and skin infection ?- take antibiotic ?- Urgent care if worsening redness, fevers, chills ? ?Watch for Bullseye rash or spotted rash ?

## 2021-12-14 NOTE — Telephone Encounter (Signed)
Patient is taking 325 (65 fe) mg tabs of iron daily. I advised patient to continue taking as is.  ?

## 2021-12-14 NOTE — Telephone Encounter (Signed)
Spoke with patient and daughter and advised we have the records and nothing further is needed.  ?

## 2021-12-16 NOTE — Telephone Encounter (Signed)
Agree. Thanks

## 2022-01-09 ENCOUNTER — Other Ambulatory Visit: Payer: Self-pay | Admitting: Physician Assistant

## 2022-01-09 ENCOUNTER — Other Ambulatory Visit: Payer: Self-pay | Admitting: Cardiovascular Disease

## 2022-01-21 ENCOUNTER — Telehealth: Payer: Self-pay

## 2022-01-21 NOTE — Telephone Encounter (Signed)
Pt's daughter called to find out if it was safe for pt to take Trion by Kate Sable.  ? ? I found this online: [email or web address] ?

## 2022-01-22 NOTE — Telephone Encounter (Signed)
My understanding is that this supplement has CBD and my concern is about him taking that at his age.  Has he tried over-the-counter glucosamine/chondroitin?  That may be a reasonable place to start. ?

## 2022-01-22 NOTE — Telephone Encounter (Signed)
Called and spoke with patients daughter about message. She thinks he has tried glucosamine/chondroitin in the past but could not remember. She is going to speak with her dad about all of this sometime today. ?

## 2022-01-23 ENCOUNTER — Other Ambulatory Visit: Payer: Self-pay

## 2022-01-23 MED ORDER — ENTRESTO 97-103 MG PO TABS: 1.0000 | ORAL_TABLET | Freq: Two times a day (BID) | ORAL | 3 refills | Status: DC

## 2022-01-23 NOTE — Telephone Encounter (Signed)
Pt's medication was sent to pt's pharmacy as requested. Confirmation received.  °

## 2022-01-28 ENCOUNTER — Other Ambulatory Visit: Payer: Self-pay

## 2022-01-28 MED ORDER — AMLODIPINE BESYLATE 5 MG PO TABS: 5.0000 mg | ORAL_TABLET | Freq: Every day | ORAL | 0 refills | Status: DC

## 2022-01-30 ENCOUNTER — Other Ambulatory Visit: Payer: Self-pay

## 2022-01-30 MED ORDER — AMLODIPINE BESYLATE 5 MG PO TABS: 5.0000 mg | ORAL_TABLET | Freq: Every day | ORAL | 3 refills | Status: DC

## 2022-02-11 DIAGNOSIS — M19019 Primary osteoarthritis, unspecified shoulder: Secondary | ICD-10-CM | POA: Diagnosis not present

## 2022-02-11 DIAGNOSIS — N183 Chronic kidney disease, stage 3 unspecified: Secondary | ICD-10-CM | POA: Diagnosis not present

## 2022-02-11 DIAGNOSIS — I4892 Unspecified atrial flutter: Secondary | ICD-10-CM | POA: Diagnosis not present

## 2022-02-11 DIAGNOSIS — I129 Hypertensive chronic kidney disease with stage 1 through stage 4 chronic kidney disease, or unspecified chronic kidney disease: Secondary | ICD-10-CM | POA: Diagnosis not present

## 2022-02-12 ENCOUNTER — Ambulatory Visit (INDEPENDENT_AMBULATORY_CARE_PROVIDER_SITE_OTHER): Payer: Medicare Other | Admitting: Nurse Practitioner

## 2022-02-12 ENCOUNTER — Telehealth: Payer: Self-pay | Admitting: Family Medicine

## 2022-02-12 ENCOUNTER — Encounter: Payer: Self-pay | Admitting: Nurse Practitioner

## 2022-02-12 VITALS — BP 118/60 | HR 64 | Temp 97.6°F | Resp 14 | Wt 173.5 lb

## 2022-02-12 DIAGNOSIS — R051 Acute cough: Secondary | ICD-10-CM | POA: Diagnosis not present

## 2022-02-12 DIAGNOSIS — R0982 Postnasal drip: Secondary | ICD-10-CM | POA: Diagnosis not present

## 2022-02-12 DIAGNOSIS — J029 Acute pharyngitis, unspecified: Secondary | ICD-10-CM

## 2022-02-12 DIAGNOSIS — J069 Acute upper respiratory infection, unspecified: Secondary | ICD-10-CM | POA: Diagnosis not present

## 2022-02-12 MED ORDER — GUAIFENESIN-CODEINE 100-10 MG/5ML PO SOLN: 5.0000 mL | Freq: Three times a day (TID) | ORAL | 0 refills | Status: AC | PRN

## 2022-02-12 MED ORDER — FLUTICASONE PROPIONATE 50 MCG/ACT NA SUSP: 2.0000 | Freq: Every day | NASAL | 0 refills | Status: DC

## 2022-02-12 MED ORDER — ALBUTEROL SULFATE HFA 108 (90 BASE) MCG/ACT IN AERS: 1.0000 | INHALATION_SPRAY | Freq: Four times a day (QID) | RESPIRATORY_TRACT | 0 refills | Status: DC | PRN

## 2022-02-12 NOTE — Telephone Encounter (Signed)
Called and spoke with patients daughter and patient (he was with her). Patient started with cough yesterday and wanted rx for cough med he had before 03/2021. I advised we would need to do a visit with patient before any rxs can be prescribed. They were fine with this and made appt with Romilda Garret, NP for today at 11:00 am.

## 2022-02-12 NOTE — Assessment & Plan Note (Signed)
Patient's throat did not show any erythema and no cervical lymphadenopathy do think this is related to may be a PND start Flonase 2 sprays nostril once daily.  Did warn about fluticasone causing nosebleeds and patient is on Eliquis so will recommend doing a nasal saline daily to patient and daughter acknowledged pending send out PCR COVID test

## 2022-02-12 NOTE — Telephone Encounter (Signed)
Joylene Grapes (Daughter) called stating "her dad needs to have a refill on a cough medication Dr. Damita Dunnings prescribed him before, they cannot remember the name or the mg's." She would like to speak with the nurse about this to figure this out, please return the call when possible.   Callback Number: [phone]

## 2022-02-12 NOTE — Assessment & Plan Note (Signed)
Symptoms likely related to an upper respiratory infection.  Patient's symptoms have been going on for approximately 24 to 36 hours.  He did do COVID test at home yesterday but still like it may be too early clinically we will send off PCR swab pending results.  Patient given guidelines in regards to self-isolation and quarantine while waiting for swab to return.  Patient's daughter was at bedside and acknowledged also

## 2022-02-12 NOTE — Assessment & Plan Note (Signed)
Flonase 2 sprays each nostril daily we will pair with over-the-counter nasal saline to prevent nosebleeds

## 2022-02-12 NOTE — Progress Notes (Signed)
Acute Office Visit  Subjective:     Patient ID: John Hines, male    DOB: [birth year], 88 y.o.   MRN: [id number]  Chief Complaint  Patient presents with   Cough    Started on 02/11/22, had wet cough but now dry. Sore throat, sneezing, runny nose, chest tightness. Cough worse at night.  No headache or fever. Has taking Robitussin and tylenol. Covid test negative on 02/11/22.     [place] sneezed serval time and nose started running. Rapid covid test was [employer] and 2 boosters. Sick contact that was negative for covid Has been using robitussin and tylenol. With little to no relief    Review of Systems  Constitutional:  Positive for malaise/fatigue. Negative for chills and fever.  HENT:  Positive for sore throat. Negative for ear discharge, ear pain and sinus pain.        PND  Respiratory:  Positive for cough (clear and thicker) and shortness of breath.        Chest tightness   Gastrointestinal:  Negative for diarrhea, nausea and vomiting.  Musculoskeletal:  Negative for joint pain and myalgias.  Neurological:  Negative for headaches.       Objective:    BP 118/60   Pulse 64   Temp 97.6 F (36.4 C)   Resp 14   Wt 173 lb 8 oz (78.7 kg)   SpO2 98%   BMI 27.17 kg/m  BP Readings from Last 3 Encounters:  02/12/22 118/60  12/14/21 100/60  12/11/21 124/60   Wt Readings from Last 3 Encounters:  02/12/22 173 lb 8 oz (78.7 kg)  12/14/21 172 lb (78 kg)  12/11/21 175 lb (79.4 kg)      Physical Exam Vitals and nursing note reviewed.  Constitutional:      Appearance: Normal appearance. He is obese.  HENT:     Right Ear: Tympanic membrane, ear canal and external ear normal.     Left Ear: Tympanic membrane, ear canal and external ear normal.     Mouth/Throat:     Mouth: Mucous membranes are moist.     Pharynx: Oropharynx is clear. No posterior oropharyngeal erythema.   Cardiovascular:     Rate and Rhythm: Normal rate and regular rhythm.     Heart sounds: Normal heart sounds.  Pulmonary:     Effort: Pulmonary effort is normal.     Breath sounds: Normal breath sounds.  Lymphadenopathy:     Cervical: No cervical adenopathy.  Neurological:     Mental Status: He is alert.    No results found for any visits on 02/12/22.      Assessment & Plan:   Problem List Items Addressed This Visit       Respiratory   Upper respiratory tract infection    Symptoms likely related to an upper respiratory infection.  Patient's symptoms have been going on for approximately 24 to 36 hours.  He did do COVID test at home yesterday but still like it may be too early clinically we will send off PCR swab pending results.  Patient given guidelines in regards to self-isolation and quarantine while waiting for swab to return.  Patient's daughter was at bedside and acknowledged also         Other   Cough - Primary   Relevant Medications   guaiFENesin-codeine 100-10 MG/5ML syrup   Other Relevant Orders   Novel Coronavirus,  NAA ([facility])   PND (post-nasal drip)    Flonase 2 sprays each nostril daily we will pair with over-the-counter nasal saline to prevent nosebleeds       Relevant Medications   fluticasone (FLONASE) 50 MCG/ACT nasal spray   Sore throat    Patient's throat did not show any erythema and no cervical lymphadenopathy do think this is related to may be a PND start Flonase 2 sprays nostril once daily.  Did warn about fluticasone causing nosebleeds and patient is on Eliquis so will recommend doing a nasal saline daily to patient and daughter acknowledged pending send out PCR COVID test       Relevant Orders   Novel Coronavirus, NAA ([facility])    Meds ordered this encounter  Medications   fluticasone (FLONASE) 50 MCG/ACT nasal spray    Sig: Place 2 sprays into both nostrils daily.    Dispense:  16 g    Refill:  0    Order Specific Question:    Supervising Provider    Answer:   TOWER, MARNE A [[id number]]   guaiFENesin-codeine 100-10 MG/5ML syrup    Sig: Take 5 mLs by mouth 3 (three) times daily as needed for up to 7 days for cough.    Dispense:  105 mL    Refill:  0    Order Specific Question:   Supervising Provider    Answer:   Loura Pardon A [[id number]]   albuterol (VENTOLIN HFA) 108 (90 Base) MCG/ACT inhaler    Sig: Inhale 1-2 puffs into the lungs every 6 (six) hours as needed for wheezing or shortness of breath.    Dispense:  8 g    Refill:  0    Order Specific Question:   Supervising Provider    Answer:   TOWER, MARNE A [[id number]]    Return if symptoms worsen or fail to improve.  Romilda Garret, NP

## 2022-02-12 NOTE — Patient Instructions (Signed)
Nice to see you today I will be in touch with the swab results once I have them Follow up if no improvement I want you to quarantine until we get the swab results back

## 2022-02-13 LAB — SPECIMEN STATUS REPORT

## 2022-02-13 LAB — NOVEL CORONAVIRUS, NAA: SARS-CoV-2, NAA: NOT DETECTED

## 2022-02-19 ENCOUNTER — Telehealth: Payer: Self-pay | Admitting: Family Medicine

## 2022-02-19 NOTE — Telephone Encounter (Signed)
Pts daughter Erven Colla called and asked for a call back from nurse about some questions about pts medication. Call back is [phone]

## 2022-02-19 NOTE — Telephone Encounter (Signed)
Spoke with patients daughter and she states patient was doing much better and stopped taking the cough medication for 3 days. Now cough has come right back and wants to know if patient can go back to taking the liquid cough medication? And also wanted to know if there was something else he could take or get the perles again? Patient saw Romilda Garret, NP for this last week so sending message to [facility].

## 2022-02-20 ENCOUNTER — Other Ambulatory Visit: Payer: Self-pay | Admitting: Family Medicine

## 2022-02-20 MED ORDER — GUAIFENESIN-CODEINE 100-10 MG/5ML PO SOLN: 5.0000 mL | Freq: Every evening | ORAL | 0 refills | Status: AC | PRN

## 2022-02-20 NOTE — Telephone Encounter (Signed)
I will send in some more cough medication for only night time use. Lets make sure that he is taking the flonase as I wrote it. If he dose not improve he will need to be re-evaluated

## 2022-02-20 NOTE — Telephone Encounter (Signed)
Spoke with John Hines and advised. John Hines said she got a letter recently from [employer] that [employer] and it said this has been taking off the market. I looked online and it looks like certain lot numbers and those that were made by them that were affected. John Hines will verify with the pharmacy to see if they can fill ones that have different lot number or any other similar choice. Nothing further is needed

## 2022-02-20 NOTE — Telephone Encounter (Signed)
Can we contact and see about all his symptoms. I saw him for a URI and did not prescribe an antibiotic at that time. Just supportive treatment

## 2022-02-20 NOTE — Telephone Encounter (Signed)
Spoke to Lisa-patient's daughter and patient, patient was ok without a cough for about 3 days until he started back coughing on 02/19/22-cough is productive with clear phlegm-does ok during the day worse at night, post nasal drip present. Has been taking Robitussin DM-helps a little but lasts for about 4 hours. No fever, headache, runny nose, SOB, sinus issues or sore throat. Patient does not take any allergy medications. Patient would like to see if he can get prescrption for the cough for night time since that is when it bothers him more.

## 2022-02-20 NOTE — Addendum Note (Signed)
Addended by: Michela Pitcher on: 02/20/2022 01:41 PM   Modules accepted: Orders

## 2022-02-22 ENCOUNTER — Other Ambulatory Visit: Payer: Self-pay | Admitting: Family Medicine

## 2022-02-24 ENCOUNTER — Other Ambulatory Visit: Payer: Self-pay | Admitting: Cardiovascular Disease

## 2022-03-05 ENCOUNTER — Ambulatory Visit: Payer: Medicare Other | Admitting: Cardiology

## 2022-03-06 ENCOUNTER — Other Ambulatory Visit: Payer: Self-pay | Admitting: Nurse Practitioner

## 2022-03-06 DIAGNOSIS — R0982 Postnasal drip: Secondary | ICD-10-CM

## 2022-03-15 ENCOUNTER — Other Ambulatory Visit: Payer: Self-pay | Admitting: Family Medicine

## 2022-04-16 ENCOUNTER — Telehealth: Payer: Self-pay | Admitting: Cardiology

## 2022-04-16 NOTE — Telephone Encounter (Signed)
Called patient, advised that we could try to get patient assistance for both medications.   I can have this set up front for them to pick  up.  Daughter verbalized understanding, thankful for call back.  She will be here in the morning.

## 2022-04-16 NOTE — Telephone Encounter (Signed)
Pt c/o medication issue:  1. Name of Medication: ELIQUIS 2.5 MG TABS tablet sacubitril-valsartan (ENTRESTO) 97-103 MG  2. How are you currently taking this medication (dosage and times per day)? As prescribed   3. Are you having a reaction (difficulty breathing--STAT)? No  4. What is your medication issue? Pt's daughter would like a call back to discuss assistance with the price of these medications. Also, she states he will be needing a refill soon. Please advise.

## 2022-06-18 ENCOUNTER — Telehealth: Payer: Self-pay | Admitting: Family Medicine

## 2022-06-18 NOTE — Telephone Encounter (Signed)
Should be fine to do so, assuming he tolerated prev covid vaccines w/o troubles.  His other concerns (heart, kidney, etc) are compelling reasons for him to get vaccinated.  Thanks.

## 2022-06-18 NOTE — Telephone Encounter (Signed)
Spoke with patients daughter Marijean Heath per [place]. Advised okay to get covid booster.

## 2022-06-18 NOTE — Telephone Encounter (Signed)
Pt's daughter, Marijean Heath, called asking would it be okay for pt to receive covid booster, saying that the pt currently has some heart conditions & kidney disease. Call back # is [phone]

## 2022-07-09 ENCOUNTER — Ambulatory Visit (INDEPENDENT_AMBULATORY_CARE_PROVIDER_SITE_OTHER): Payer: Medicare Other | Admitting: Family Medicine

## 2022-07-09 ENCOUNTER — Encounter: Payer: Self-pay | Admitting: Family Medicine

## 2022-07-09 DIAGNOSIS — R059 Cough, unspecified: Secondary | ICD-10-CM | POA: Diagnosis not present

## 2022-07-09 DIAGNOSIS — E039 Hypothyroidism, unspecified: Secondary | ICD-10-CM | POA: Diagnosis not present

## 2022-07-09 MED ORDER — LEVOTHYROXINE SODIUM 75 MCG PO TABS: 75.0000 ug | ORAL_TABLET | Freq: Every day | ORAL | 1 refills | Status: DC

## 2022-07-09 MED ORDER — FERROUS SULFATE 325 (65 FE) MG PO TABS: 325.0000 mg | ORAL_TABLET | Freq: Every day | ORAL | 1 refills | Status: DC

## 2022-07-09 MED ORDER — DOXYCYCLINE HYCLATE 100 MG PO TABS: 100.0000 mg | ORAL_TABLET | Freq: Two times a day (BID) | ORAL | 0 refills | Status: DC

## 2022-07-09 MED ORDER — GUAIFENESIN-CODEINE 100-10 MG/5ML PO SOLN: 5.0000 mL | Freq: Two times a day (BID) | ORAL | 0 refills | Status: DC | PRN

## 2022-07-09 NOTE — Progress Notes (Unsigned)
Cough recently started.  Chest sore from coughing but that is better now.  Some sputum.  Discolored sputum, light green.  Today is 4th day of sx  More sx last night and sleep disruption.  Nights are worse than days in general.  No fevers.  No vomiting.  No facial pain.  No ear pain.  Scratchy throat recently but not now.  No wheeze.  Can still get a deep breath.  No known sick contacts.  Still eating well.  Not lightheaded.  Neg covid test a few days ago.   Prev guaifenesin codeine cough syrup helped w/o ADE, cautions d/w pt.    Meds, vitals, and allergies reviewed.   ROS: Per HPI unless specifically indicated in ROS section   GEN: nad, alert and oriented HEENT: ncat, TM wnl, sinuses not ttp.   NECK: supple w/o LA CV: rrr.  PULM: ctab, no inc wob, no focal decrease in breath sounds ABD: soft, +bs EXT: no edema SKIN: Well-perfused.

## 2022-07-09 NOTE — Patient Instructions (Signed)
Rest and fluids. Use the cough medicine and start doxycycline.   Update me as needed.  Plan on recheck in spring 2024.  Labs at or before the visit.  Take care.  Glad to see you.

## 2022-07-10 ENCOUNTER — Telehealth: Payer: Self-pay | Admitting: Family Medicine

## 2022-07-10 NOTE — Assessment & Plan Note (Signed)
Lungs are clear.  Presumed bronchitis.  Given his situation I think it makes sense to use the guaifenesin/codeine cough syrup.  Sedation caution.  He may take that just at night, if needed.  He may be able to get by using plain guaifenesin in the mornings.  Start doxycycline and update me as needed.  He had a recent negative COVID test.  Okay for outpatient follow-up.  He agrees with plan.

## 2022-07-10 NOTE — Telephone Encounter (Signed)
Patient daughter called in stating that patient was seen in office yesterday, and she think that the rx he was prescribed has made him dizzy today,and as if he's going to pass out. They will probably be calling EMS for him,but she would just like to know if she should stop giving him his b.p medication until they figure out what's going on with him? She would like a phone call as soon as possible.

## 2022-07-10 NOTE — Telephone Encounter (Signed)
Late entry.  I called patient at approximately 17:40.  By that point he was feeling better.  We talked about the previous episode.  He had vomited but his symptoms had resolved in the meantime.  He had rechecked his blood pressure and it was normal.  He could have had a vagal episode where he was nauseous, sweaty and had a transiently low blood pressure.  He clearly feels better in the meantime.  It is unclear if this episode was related to his illness, his antibiotic, his cough medicine, or some combination of the above.  We both thought it was reasonable for him to hold his cough medication and antibiotic tonight and update me about his situation tomorrow morning.  Routine cautions given to patient he agrees.

## 2022-07-10 NOTE — Telephone Encounter (Signed)
Called spoke to daughter. States that after he took his cough medicine this morning. Shortly after patient called and told her that he was not feeling well. About an hour after he dozed off. When he woke up he was dizzy and felt swimmy headed. He sat back down and realized that he was sweating. After about 30 minutes of that he went to the bathroom and had explosive vomiting. He vomited strait for about 2-3 minutes. After he finished he put cool water on head and stomach and about 16mn started feeling like himself. Patient denies any urinary symptoms, fever, his blood pressure was normal. He has not taken any of the Guaifenesin - Codeine after that time. Daughter is picking up some rubitussin DM. Patient and daughter as well as son all informed that if he starts having any of symptoms above or any red words they will go to ED for evaluation. Patient advised that I am sending note to Dr. DDamita Dunningsfor review.

## 2022-07-11 ENCOUNTER — Encounter: Payer: Self-pay | Admitting: Family Medicine

## 2022-07-11 NOTE — Telephone Encounter (Signed)
Glad to hear.  If clearly better, then I would hold off both the abx and the cough medicine.  Please update [place] as needed.  Glad he feels better.  Thanks.

## 2022-07-11 NOTE — Telephone Encounter (Signed)
Called and notified patients daughter. She stated that patient never did take the abx when everything happened; only the cough medication. He started the abx this am and has not had any trouble.

## 2022-07-11 NOTE — Telephone Encounter (Signed)
Daughter called in to let Dr Damita Dunnings know that patient is feeling much better today.

## 2022-07-11 NOTE — Telephone Encounter (Signed)
In that case, doxy isn't the issue.  I presume the cough medicine was the issue.  Okay to continue doxy.  Update me as needed.  Thanks.

## 2022-07-23 NOTE — Telephone Encounter (Signed)
error 

## 2022-09-12 ENCOUNTER — Ambulatory Visit (INDEPENDENT_AMBULATORY_CARE_PROVIDER_SITE_OTHER): Payer: Medicare Other | Admitting: *Deleted

## 2022-09-12 ENCOUNTER — Telehealth: Payer: Self-pay | Admitting: *Deleted

## 2022-09-12 DIAGNOSIS — Z23 Encounter for immunization: Secondary | ICD-10-CM

## 2022-09-12 NOTE — Telephone Encounter (Signed)
Pt came in for a nurse visit to get a flu shot. He asked me to send a refill request to PCP. He would like a refill of sildenafil 20 mg. It's not on med list any more but last filled on 06/20/20 #50 tabs w/ 5 refills   Last CPE was 12/11/21

## 2022-09-13 MED ORDER — SILDENAFIL CITRATE 20 MG PO TABS: 20.0000 mg | ORAL_TABLET | Freq: Every day | ORAL | 3 refills | Status: DC | PRN

## 2022-09-13 NOTE — Telephone Encounter (Signed)
Sent. Thanks.   

## 2022-09-14 ENCOUNTER — Other Ambulatory Visit: Payer: Self-pay | Admitting: Cardiovascular Disease

## 2022-09-14 ENCOUNTER — Other Ambulatory Visit: Payer: Self-pay | Admitting: Cardiology

## 2022-09-14 ENCOUNTER — Other Ambulatory Visit: Payer: Self-pay | Admitting: Physician Assistant

## 2022-09-24 ENCOUNTER — Other Ambulatory Visit (HOSPITAL_COMMUNITY): Payer: Self-pay

## 2022-10-10 DIAGNOSIS — Z23 Encounter for immunization: Secondary | ICD-10-CM | POA: Diagnosis not present

## 2022-11-12 ENCOUNTER — Other Ambulatory Visit: Payer: Self-pay | Admitting: Cardiology

## 2022-11-18 ENCOUNTER — Ambulatory Visit: Payer: Medicare Other | Admitting: Internal Medicine

## 2022-11-18 ENCOUNTER — Ambulatory Visit (INDEPENDENT_AMBULATORY_CARE_PROVIDER_SITE_OTHER): Payer: Medicare Other | Admitting: Family Medicine

## 2022-11-18 ENCOUNTER — Encounter: Payer: Self-pay | Admitting: Family Medicine

## 2022-11-18 ENCOUNTER — Telehealth: Payer: Self-pay

## 2022-11-18 VITALS — BP 124/60 | HR 65 | Temp 97.9°F | Wt 174.0 lb

## 2022-11-18 DIAGNOSIS — E039 Hypothyroidism, unspecified: Secondary | ICD-10-CM | POA: Diagnosis not present

## 2022-11-18 DIAGNOSIS — N189 Chronic kidney disease, unspecified: Secondary | ICD-10-CM

## 2022-11-18 DIAGNOSIS — I509 Heart failure, unspecified: Secondary | ICD-10-CM | POA: Diagnosis not present

## 2022-11-18 DIAGNOSIS — I10 Essential (primary) hypertension: Secondary | ICD-10-CM

## 2022-11-18 DIAGNOSIS — M1000 Idiopathic gout, unspecified site: Secondary | ICD-10-CM

## 2022-11-18 NOTE — Telephone Encounter (Signed)
Pt already has appt scheduled per appt notes with Dr Silvio Pate 11/18/22 at 10:30 AM. Sending note to Dr Silvio Pate.   [facility] Night - Client TELEPHONE ADVICE RECORD [facility] Patient Name: John Hines MMONS Gender: Male DOB: [birth year] Age: 89 Y 77 M 1 D Return Phone Number: [phone] (Secondary) Address[place]  [address] Client [place] Night - Client Client Site [place] - Night Provider Renford Dills - MD Contact Type Call Who Is Calling Patient / Member / Family / Caregiver Call Type Triage / Clinical Caller Name Zada Girt Relationship To Patient Daughter Return Phone Number [phone] (Secondary) Chief Complaint Swelling (generalized) Reason for Call Symptomatic / Request for [place] states her father has swelling in his hand. He has heart and kidney disease. She wants to know if this could be related to his heart. Translation No Nurse Assessment Nurse: Alyson Ingles, RN, Wells Guiles Date/Time (Eastern Time): 11/15/2022 9:08:50 PM Confirm and document reason for call. If symptomatic, describe symptoms. ---Caller states that her father has swelling in his right hand, and his ankles as well. He has heart and kidney disease, caller is concerned that it's related. He states he missed his night medicine x3 days and had eaten more salt during that time about 4 days ago. He thinks its improving since cutting down salt and taking medicine. Swelling began 4 days ago Does the patient have any new or worsening symptoms? ---Yes Will a triage be completed? ---Yes Related visit to physician within the last 2 weeks? ---No Does the PT have any chronic conditions? (i.e. diabetes, asthma, this includes High risk factors for pregnancy, etc.) ---Yes List chronic conditions. ---HTN, heart disease, one functioning kidney, high cholesterol Is this a  behavioral health or substance abuse call? ---No Guidelines Guideline Title Affirmed Question Affirmed Notes Nurse Date/Time Eilene Ghazi Time) Leg Swelling and Edema [1] MODERATE leg swelling (e.g., swelling extends up to knees) AND Alyson Ingles, RN, Wells Guiles 11/15/2022 9:15:26 PM PLEASE NOTE: All timestamps contained within this report are represented as [place] Standard Time. CONFIDENTIALTY NOTICE: This fax transmission is intended only for the addressee. It contains information that is legally privileged, confidential or otherwise protected from use or disclosure. If you are not the intended recipient, you are strictly prohibited from reviewing, disclosing, copying using or disseminating any of this information or taking any action in reliance on or regarding this information. If you have received this fax in error, please notify us immediately by telephone so that we can arrange for its return to [place]. Phone: [phone], Toll-Free: [phone], Fax: [phone] Page: 2 of 2 Call Id: [id number] Guidelines Guideline Title Affirmed Question Affirmed Notes Nurse Date/Time Eilene Ghazi Time) [2] new-onset or worsening Disp. Time Eilene Ghazi Time) Disposition Final User 11/15/2022 9:22:58 PM See PCP within 24 Hours Yes Alyson Ingles, RN, Wells Guiles Final Disposition 11/15/2022 9:22:58 PM See PCP within 24 Hours Yes Alyson Ingles, RN, Romualdo Bolk Disagree/Comply Comply Caller Understands Yes PreDisposition Did not know what to do Care Advice Given Per Guideline SEE PCP WITHIN 24 HOURS: * IF OFFICE WILL BE CLOSED: You need to be seen within the next 24 hours. A clinic or an urgent care center is often a good source of care if your doctor's office is closed or you can't get an appointment. LEG SWELLING OR EDEMA: * Elevate your legs or try to lie down one or two times a day for 20 minutes. CALL BACK IF: * Breathing difficulty or  chest pain occurs * You become worse CARE ADVICE given per Leg Swelling and Edema  (Adult) guideline. Comments User: Roswell Nickel, RN Date/Time Eilene Ghazi Time): 11/15/2022 9:16:04 PM Legs are swollen not ankles per caller Referrals GO TO [place]

## 2022-11-18 NOTE — Patient Instructions (Addendum)
Keep taking your meds as you have been.  Recheck labs in about 1 week (at a fasting lab visit) if you are still gradually getting better.  We can update the kidney clinic about your labs.   If you are not improving over the next few days, then let me know and we can recheck labs sooner.   Take care.  Glad to see you.

## 2022-11-18 NOTE — Telephone Encounter (Signed)
Will see at OV.  Thanks.  

## 2022-11-18 NOTE — Progress Notes (Unsigned)
He is working on his garden.    Missed some meds at night for 3 nights.  Had more salt recently.  Noted inc swelling in R hand and B feet.  Back on meds in the meantime, he stopped all salt in the meantime.  R hand is better, R leg is still puffy.  L leg is better.  Not SOB.  No CP.  Not SOB supine.    Back on meds at baseline for about 4 days.    L hand not puffy, R hand mildly puffy.   1+ BLE edema.

## 2022-11-19 ENCOUNTER — Telehealth: Payer: Self-pay | Admitting: Family Medicine

## 2022-11-19 NOTE — Telephone Encounter (Signed)
Contacted John Hines to schedule their annual wellness visit. Appointment made for 12/30/2022.  [place] Direct Dial: [phone]

## 2022-11-20 NOTE — Assessment & Plan Note (Signed)
I suspect that accidentally missing some of his medication doses along with increased salt intake explain his recent swelling.  His lungs are clear and he is okay for outpatient follow-up.  He has improved with salt restriction and restarting his medication.  My concern is that if we check his labs today we may get results that do not reflect his recent clinical improvement and would not be useful.  He understood and agreed, as did his daughter.  I would continue his baseline medication as he has been taking them.  Recheck labs in about 1 week (at a fasting lab visit) if still gradually getting better.  We can update the kidney clinic about his labs.   If not improving over the next few days, then let me know and we can recheck labs sooner.

## 2022-11-26 ENCOUNTER — Other Ambulatory Visit (INDEPENDENT_AMBULATORY_CARE_PROVIDER_SITE_OTHER): Payer: Medicare Other

## 2022-11-26 DIAGNOSIS — I10 Essential (primary) hypertension: Secondary | ICD-10-CM | POA: Diagnosis not present

## 2022-11-26 DIAGNOSIS — N189 Chronic kidney disease, unspecified: Secondary | ICD-10-CM | POA: Diagnosis not present

## 2022-11-26 DIAGNOSIS — E039 Hypothyroidism, unspecified: Secondary | ICD-10-CM

## 2022-11-26 DIAGNOSIS — M1000 Idiopathic gout, unspecified site: Secondary | ICD-10-CM | POA: Diagnosis not present

## 2022-11-26 LAB — COMPREHENSIVE METABOLIC PANEL
ALT: 9 U/L (ref 0–53)
AST: 12 U/L (ref 0–37)
Albumin: 3.8 g/dL (ref 3.5–5.2)
Alkaline Phosphatase: 108 U/L (ref 39–117)
BUN: 23 mg/dL (ref 6–23)
CO2: 27 mEq/L (ref 19–32)
Calcium: 9.2 mg/dL (ref 8.4–10.5)
Chloride: 107 mEq/L (ref 96–112)
Creatinine, Ser: 1.86 mg/dL — ABNORMAL HIGH (ref 0.40–1.50)
GFR: 31.83 mL/min — ABNORMAL LOW (ref >60.00)
Glucose, Bld: 107 mg/dL — ABNORMAL HIGH (ref 70–99)
Potassium: 4.6 mEq/L (ref 3.5–5.1)
Sodium: 141 mEq/L (ref 135–145)
Total Bilirubin: 0.4 mg/dL (ref 0.2–1.2)
Total Protein: 6.3 g/dL (ref 6.0–8.3)

## 2022-11-26 LAB — CBC WITH DIFFERENTIAL/PLATELET
Basophils Absolute: 0 10*3/uL (ref 0.0–0.1)
Basophils Relative: 0.5 % (ref 0.0–3.0)
Eosinophils Absolute: 0 10*3/uL (ref 0.0–0.7)
Eosinophils Relative: 0.4 % (ref 0.0–5.0)
HCT: 31.4 % — ABNORMAL LOW (ref 39.0–52.0)
Hemoglobin: 10.3 g/dL — ABNORMAL LOW (ref 13.0–17.0)
Lymphocytes Relative: 33 % (ref 12.0–46.0)
Lymphs Abs: 1.3 10*3/uL (ref 0.7–4.0)
MCHC: 32.8 g/dL (ref 30.0–36.0)
MCV: 82.7 fl (ref 78.0–100.0)
Monocytes Absolute: 0.9 10*3/uL (ref 0.1–1.0)
Monocytes Relative: 22.6 % — ABNORMAL HIGH (ref 3.0–12.0)
Neutro Abs: 1.7 10*3/uL (ref 1.4–7.7)
Neutrophils Relative %: 43.5 % (ref 43.0–77.0)
Platelets: 220 10*3/uL (ref 150.0–400.0)
RBC: 3.8 Mil/uL — ABNORMAL LOW (ref 4.22–5.81)
RDW: 16.4 % — ABNORMAL HIGH (ref 11.5–15.5)
WBC: 3.8 10*3/uL — ABNORMAL LOW (ref 4.0–10.5)

## 2022-11-26 LAB — TSH: TSH: 3.81 u[IU]/mL (ref 0.35–5.50)

## 2022-11-26 LAB — LIPID PANEL
Cholesterol: 119 mg/dL (ref 0–200)
HDL: 42.7 mg/dL (ref >39.00)
LDL Cholesterol: 66 mg/dL (ref 0–99)
NonHDL: 76.71
Total CHOL/HDL Ratio: 3
Triglycerides: 56 mg/dL (ref 0.0–149.0)
VLDL: 11.2 mg/dL (ref 0.0–40.0)

## 2022-11-26 LAB — IRON: Iron: 29 ug/dL — ABNORMAL LOW (ref 42–165)

## 2022-11-26 LAB — URIC ACID: Uric Acid, Serum: 7.8 mg/dL (ref 4.0–7.8)

## 2022-12-01 ENCOUNTER — Other Ambulatory Visit: Payer: Self-pay | Admitting: Family Medicine

## 2022-12-01 DIAGNOSIS — D509 Iron deficiency anemia, unspecified: Secondary | ICD-10-CM

## 2022-12-03 ENCOUNTER — Telehealth: Payer: Self-pay | Admitting: Cardiology

## 2022-12-03 NOTE — Telephone Encounter (Signed)
Pt c/o swelling: STAT is pt has developed SOB within 24 hours  How much weight have you gained and in what time span?  No weight gain, patient remains around 170 lbs  If swelling, where is the swelling located?  Hands and legs   Are you currently taking a fluid pill?  Yes, but patient's daughter does not know the name/dose  Are you currently SOB?  Not currently with the patient  Do you have a log of your daily weights (if so, list)?    Have you gained 3 pounds in a day or 5 pounds in a week?   Have you traveled recently?  No

## 2022-12-03 NOTE — Telephone Encounter (Signed)
Pt daughter called and had her father on 3-way, reporting pt right hand and both feet are swollen. He did contact his PCP and was advised to continue taking current medications, reduce salt intake , and have repeat labs to check his kidney function. Patient denies any other symptoms. MD and nurse are currently not in the office. Will forward to MD for advise.

## 2022-12-04 NOTE — Telephone Encounter (Signed)
Ok to speak with dtr. Advised to f/u w/ PCP, per [place]. Dtr reports they did, PCP did some blood work and is having pt follow up in a month with them.  She reports bilateral hand & pedal edema, that is not getting better. Aware pt is overdue for general cardiology follow up. Aware forwarding to scheduler to arrange OV w/ Dr. Claiborne Billings or his team. Dtr would like next available if possible. Aware someone will follow up on this matter. She is agreeable to plan and appreciates my call.

## 2022-12-16 ENCOUNTER — Other Ambulatory Visit: Payer: Self-pay | Admitting: Cardiology

## 2022-12-30 ENCOUNTER — Ambulatory Visit (INDEPENDENT_AMBULATORY_CARE_PROVIDER_SITE_OTHER): Payer: Medicare Other

## 2022-12-30 VITALS — Ht 67.0 in | Wt 174.0 lb

## 2022-12-30 DIAGNOSIS — Z0000 Encounter for general adult medical examination without abnormal findings: Secondary | ICD-10-CM

## 2022-12-30 NOTE — Progress Notes (Signed)
Subjective:   John Hines is a 89 y.o. male who presents for Medicare Annual/Subsequent preventive examination.  I connected with  Hassel Neth on 12/30/22 by a audio enabled telemedicine application and verified that I am speaking with the correct person using two identifiers.  Patient Location: Home  Provider Location: Office/Clinic  I discussed the limitations of evaluation and management by telemedicine. The patient expressed understanding and agreed to proceed.  Review of Systems     Cardiac Risk Factors include: male gender;advanced age (>62men, >62 women);dyslipidemia;hypertension     Objective:    Today's Vitals   12/30/22 1037  Weight: 174 lb (78.9 kg)  Height:  (1.702 m)   Body mass index is 27.25 kg/m.     12/30/2022   10:49 AM 03/30/2021    4:54 PM 05/02/2020    2:06 PM 07/11/2019    3:11 PM 06/27/2019    3:00 PM 06/25/2019    7:59 PM 05/12/2019   11:12 AM  Advanced Directives  Does Patient Have a Medical Advance Directive? No No No No No No No  Does patient want to make changes to medical advance directive? Yes (MAU/Ambulatory/Procedural Areas - Information given)        Would patient like information on creating a medical advance directive?  No - Patient declined No - Patient declined No - Patient declined No - Patient declined  No - Patient declined    Current Medications (verified) Outpatient Encounter Medications as of 12/30/2022  Medication Sig   amiodarone (PACERONE) 200 MG tablet TAKE ONE-HALF TABLET BY MOUTH  DAILY   amLODipine (NORVASC) 5 MG tablet TAKE 1 TABLET BY MOUTH DAILY   atorvastatin (LIPITOR) 80 MG tablet TAKE 1 TABLET BY MOUTH ONCE  DAILY   carvedilol (COREG) 12.5 MG tablet TAKE 1 TABLET BY MOUTH TWICE  DAILY   ELIQUIS 2.5 MG TABS tablet TAKE 1 TABLET BY MOUTH TWICE  DAILY   ferrous sulfate 325 (65 FE) MG tablet Take 1 tablet (325 mg total) by mouth daily.   levothyroxine (SYNTHROID) 75 MCG tablet Take 1 tablet (75 mcg total) by  mouth daily before breakfast.   sacubitril-valsartan (ENTRESTO) 97-103 MG Take 1 tablet by mouth 2 (two) times daily. Please call [phone] to schedule an appointment for future refills. Thank you. 1st attempt.   spironolactone (ALDACTONE) 25 MG tablet TAKE ONE-HALF TABLET BY MOUTH  DAILY   acetaminophen (TYLENOL) 160 MG/5ML liquid Take by mouth every 4 (four) hours as needed for fever. Take as needed (Patient not taking: Reported on 12/30/2022)   sildenafil (REVATIO) 20 MG tablet Take 1-5 tablets (20-100 mg total) by mouth daily as needed (Do not take with nitroglycerin.  Start with 1 tablet daily as needed.). (Patient not taking: Reported on 12/30/2022)   No facility-administered encounter medications on file as of 12/30/2022.    Allergies (verified) Lisinopril, Molnupiravir, Nsaids, and Penicillins   History: Past Medical History:  Diagnosis Date   Arthritis    BPH (benign prostatic hyperplasia)    CHF (congestive heart failure)    Nonischemic Cardiomyopathy. EF 25-35%   Chronic kidney disease 05/20/2013   CKD 3   Colon polyps    Dyslipidemia    Dysrhythmia 08/19/11   "low heart beat; maybe 25bpm; got RX; now 60bpm"   ED (erectile dysfunction)    Gout    Heart murmur    Hyperlipidemia    Hypertension    Low back pain 08/12/2013   Nonfunctioning kidney Recognized 03/2006  Chronically and Severely Hydronephrotic and Nonfunctioning Right Kidney   Solitary kidney Recognized 03/2006   Solitary Functioning Left Kidney   Stroke    "mild stroke; dx'd 2010; don't know when it happened"   Syncope and collapse 08/19/11   "shallow breathing; sweating horribly"   Past Surgical History:  Procedure Laterality Date   CARDIOVASCULAR STRESS TEST  08/30/2010   Small to moderate fixed inferoseptal defect. Severe global hypokinesis with inferior akinesis and septal dyskinesis-likely secondary to a prominent LBBB. Non-diagnostic for ischemia due to persantine.   KNEE ARTHROSCOPY  1996   right    TRANSTHORACIC ECHOCARDIOGRAM  08/20/2011   EF 45-50%, severe concentric hyperthrophy.   Family History  Problem Relation Age of Onset   Early death Mother        died in child birth   Stomach cancer Father        stomach   Social History   Socioeconomic History   Marital status: Widowed    Spouse name: Not on file   Number of children: Not on file   Years of education: Not on file   Highest education level: Not on file  Occupational History   Not on file  Tobacco Use   Smoking status: Never   Smokeless tobacco: Never  Vaping Use   Vaping Use: Never used  Substance and Sexual Activity   Alcohol use: Yes    Comment: occasional   Drug use: No   Sexual activity: Yes  Other Topics Concern   Not on file  Social History Narrative   Grew up in [place].  His mother died in childbirth when he was an infant.   Widowed 2019.  Was previously married for 57 years.   He works Advertising copywriter for [employer].   Social Determinants of Health   Financial Resource Strain: Low Risk  (12/30/2022)   Overall Financial Resource Strain (CARDIA)    Difficulty of Paying Living Expenses: Not hard at all  Food Insecurity: No Food Insecurity (12/30/2022)   Hunger Vital Sign    Worried About Running Out of Food in the Last Year: Never true    Ran Out of Food in the Last Year: Never true  Transportation Needs: No Transportation Needs (12/30/2022)   PRAPARE - Administrator, Civil Service (Medical): No    Lack of Transportation (Non-Medical): No  Physical Activity: Sufficiently Active (12/30/2022)   Exercise Vital Sign    Days of Exercise per Week: 5 days    Minutes of Exercise per Session: 30 min  Stress: No Stress Concern Present (12/30/2022)   [employer] of Occupational Health - Occupational Stress Questionnaire    Feeling of Stress : Not at all  Social Connections: Moderately Isolated (12/30/2022)   Social Connection and Isolation Panel [NHANES]    Frequency  of Communication with Friends and Family: More than three times a week    Frequency of Social Gatherings with Friends and Family: Three times a week    Attends Religious Services: Never    Active Member of Clubs or Organizations: Yes    Attends Banker Meetings: Never    Marital Status: Widowed    Tobacco Counseling Counseling given: Not Answered   Clinical Intake:  Pre-visit preparation completed: Yes  Pain : No/denies pain     Diabetes: No  How often do you need to have someone help you when you read instructions, pamphlets, or other written materials from your doctor or pharmacy?: 1 -  Never  Diabetic?No   Interpreter Needed?: No  Comments: Assisted with visit by daughter-Leta Information entered by :: Kandis Fantasiaourtney Kriston Mckinnie LPN   Activities of Daily Living    12/30/2022   11:01 AM  In your present state of health, do you have any difficulty performing the following activities:  Hearing? 0  Vision? 0  Difficulty concentrating or making decisions? 0  Walking or climbing stairs? 0  Dressing or bathing? 0  Doing errands, shopping? 0  Preparing Food and eating ? N  Using the Toilet? N  In the past six months, have you accidently leaked urine? N  Do you have problems with loss of bowel control? N  Managing your Medications? N  Managing your Finances? N  Housekeeping or managing your Housekeeping? N    Patient Care Team: Joaquim Namuncan, Graham S, MD as PCP - General (Family Medicine) Lennette BihariKelly, Mallory A, MD as PCP - Cardiology (Cardiology) Annie SableGoldsborough, Kellie, MD as Consulting Physician (Nephrology)  Indicate any recent Medical Services you may have received from other than [facility] providers in the past year (date may be approximate).     Assessment:   This is a routine wellness examination for [facility].  Hearing/Vision screen Hearing Screening - Comments:: Denies hearing difficulties  Vision Screening - Comments:: No vision problems; will schedule routine eye exam  soon    Dietary issues and exercise activities discussed: Current Exercise Habits: Home exercise routine, Type of exercise: walking;stretching, Time (Minutes): 30, Frequency (Times/Week): 5, Weekly Exercise (Minutes/Week): 150, Intensity: Mild   Goals Addressed             This Visit's Progress    COMPLETED: Patient Stated       05/02/2020, I will maintain and continue medications as prescribed.      Remain active and independent        Depression Screen    12/30/2022   10:47 AM 11/18/2022    4:16 PM 08/27/2021   11:41 AM 05/02/2020    2:08 PM 05/12/2019   11:04 AM 02/11/2018   10:08 AM 09/03/2017    2:34 PM  PHQ 2/9 Scores  PHQ - 2 Score 0 0 0 0 0 0 0  PHQ- 9 Score 0 1  0       Fall Risk    12/30/2022   10:58 AM 11/20/2022   12:32 PM 08/27/2021   11:41 AM 05/02/2020    2:08 PM 09/03/2017    2:34 PM  Fall Risk   Falls in the past year? 0 0 0 0 Yes  Number falls in past yr: 0 0 0 0 1  Injury with Fall? 0 0 0 0 Yes  Comment     back pain   Risk for fall due to : No Fall Risks No Fall Risks No Fall Risks Medication side effect   Follow up Falls prevention discussed;Education provided;Falls evaluation completed Falls evaluation completed Falls evaluation completed Falls evaluation completed;Falls prevention discussed     FALL RISK PREVENTION PERTAINING TO THE HOME:  Any stairs in or around the home? No  If so, are there any without handrails? No  Home free of loose throw rugs in walkways, pet beds, electrical cords, etc? Yes  Adequate lighting in your home to reduce risk of falls? Yes   ASSISTIVE DEVICES UTILIZED TO PREVENT FALLS:  Life alert? No  Use of a cane, walker or w/c? No  Grab bars in the bathroom? Yes  Shower chair or bench in shower? No  Elevated toilet seat  or a handicapped toilet? Yes   TIMED UP AND GO:  Was the test performed? No . Telephonic visit   Cognitive Function:    05/02/2020    2:10 PM  MMSE - Mini Mental State Exam  Orientation to time 5   Orientation to Place 5  Registration 3  Attention/ Calculation 5  Recall 3  Language- repeat 1        12/30/2022   11:02 AM  6CIT Screen  What Year? 0 points  What month? 0 points  What time? 0 points  Count back from 20 0 points  Months in reverse 0 points  Repeat phrase 0 points  Total Score 0 points    Immunizations Immunization History  Administered Date(s) Administered   Fluad Quad(high Dose 65+) 06/20/2020, 07/27/2021, 09/12/2022   Influenza,inj,Quad PF,6+ Mos 09/22/2013, 06/16/2014, 08/07/2017, 07/06/2019   Influenza-Unspecified 08/23/2015   PFIZER(Purple Top)SARS-COV-2 Vaccination 10/25/2019, 11/15/2019, 08/14/2020   Pfizer Covid-19 Vaccine Bivalent Booster 30yrs & up 08/21/2021   Pneumococcal Conjugate-13 09/22/2013   Pneumococcal Polysaccharide-23 11/22/2011   Td 09/23/2005    TDAP status: Due, Education has been provided regarding the importance of this vaccine. Advised may receive this vaccine at local pharmacy or Health Dept. Aware to provide a copy of the vaccination record if obtained from local pharmacy or Health Dept. Verbalized acceptance and understanding.  Flu Vaccine status: Up to date  Pneumococcal vaccine status: Up to date  Covid-19 vaccine status: Information provided on how to obtain vaccines.   Qualifies for Shingles Vaccine? Yes   Zostavax completed No   Shingrix Completed?: No.    Education has been provided regarding the importance of this vaccine. Patient has been advised to call insurance company to determine out of pocket expense if they have not yet received this vaccine. Advised may also receive vaccine at local pharmacy or Health Dept. Verbalized acceptance and understanding.  Screening Tests Health Maintenance  Topic Date Due   Zoster Vaccines- Shingrix (1 of 2) Never done   DTaP/Tdap/Td (2 - Tdap) 09/24/2015   COVID-19 Vaccine (5 - 2023-24 season) 05/24/2022   INFLUENZA VACCINE  04/24/2023   Medicare Annual Wellness (AWV)   12/30/2023   Pneumonia Vaccine 62+ Years old  Completed   HPV VACCINES  Aged Out    Health Maintenance  Health Maintenance Due  Topic Date Due   Zoster Vaccines- Shingrix (1 of 2) Never done   DTaP/Tdap/Td (2 - Tdap) 09/24/2015   COVID-19 Vaccine (5 - 2023-24 season) 05/24/2022    Colorectal cancer screening: No longer required.   Lung Cancer Screening: (Low Dose CT Chest recommended if Age 45-80 years, 30 pack-year currently smoking OR have quit w/in 15years.) does not qualify.   Lung Cancer Screening Referral: n/a  Additional Screening:  Hepatitis C Screening: does not qualify;  Vision Screening: Recommended annual ophthalmology exams for early detection of glaucoma and other disorders of the eye. Is the patient up to date with their annual eye exam?  No  Who is the provider or what is the name of the office in which the patient attends annual eye exams? none If pt is not established with a provider, would they like to be referred to a provider to establish care? No .   Dental Screening: Recommended annual dental exams for proper oral hygiene  Community Resource Referral / Chronic Care Management: CRR required this visit?  No   CCM required this visit?  No      Plan:     I  have personally reviewed and noted the following in the patient's chart:   Medical and social history Use of alcohol, tobacco or illicit drugs  Current medications and supplements including opioid prescriptions. Patient is not currently taking opioid prescriptions. Functional ability and status Nutritional status Physical activity Advanced directives List of other physicians Hospitalizations, surgeries, and ER visits in previous 12 months Vitals Screenings to include cognitive, depression, and falls Referrals and appointments  In addition, I have reviewed and discussed with patient certain preventive protocols, quality metrics, and best practice recommendations. A written personalized care  plan for preventive services as well as general preventive health recommendations were provided to patient.     Durwin Nora, [place]   01/26/9740   Due to this being a virtual visit, the after visit summary with patients personalized plan was offered to patient via mail or my-chart.   per request, patient was mailed a copy of AVS  Nurse Notes: No concerns

## 2022-12-30 NOTE — Patient Instructions (Signed)
John Hines , Thank you for taking time to come for your Medicare Wellness Visit. I appreciate your ongoing commitment to your health goals. Please review the following plan we discussed and let me know if I can assist you in the future.   These are the goals we discussed:  Goals      Blood Pressure < 130/80     Remain active and independent        This is a list of the screening recommended for you and due dates:  Health Maintenance  Topic Date Due   Zoster (Shingles) Vaccine (1 of 2) Never done   DTaP/Tdap/Td vaccine (2 - Tdap) 09/24/2015   COVID-19 Vaccine (5 - 2023-24 season) 05/24/2022   Flu Shot  04/24/2023   Medicare Annual Wellness Visit  12/30/2023   Pneumonia Vaccine  Completed   HPV Vaccine  Aged Out    Advanced directives: Advance directive discussed with you today. I have provided a copy for you to complete at home and have notarized. Once this is complete please bring a copy in to our office so we can scan it into your chart.   Conditions/risks identified: Aim for 30 minutes of exercise or brisk walking, 6-8 glasses of water, and 5 servings of fruits and vegetables each day.   Next appointment: Follow up in one year for your annual wellness visit.   Preventive Care 87 Years and Older, Male  Preventive care refers to lifestyle choices and visits with your health care provider that can promote health and wellness. What does preventive care include? A yearly physical exam. This is also called an annual well check. Dental exams once or twice a year. Routine eye exams. Ask your health care provider how often you should have your eyes checked. Personal lifestyle choices, including: Daily care of your teeth and gums. Regular physical activity. Eating a healthy diet. Avoiding tobacco and drug use. Limiting alcohol use. Practicing safe sex. Taking low doses of aspirin every day. Taking vitamin and mineral supplements as recommended by your health care provider. What  happens during an annual well check? The services and screenings done by your health care provider during your annual well check will depend on your age, overall health, lifestyle risk factors, and family history of disease. Counseling  Your health care provider may ask you questions about your: Alcohol use. Tobacco use. Drug use. Emotional well-being. Home and relationship well-being. Sexual activity. Eating habits. History of falls. Memory and ability to understand (cognition). Work and work Astronomer. Screening  You may have the following tests or measurements: Height, weight, and BMI. Blood pressure. Lipid and cholesterol levels. These may be checked every 5 years, or more frequently if you are over 28 years old. Skin check. Lung cancer screening. You may have this screening every year starting at age 61 if you have a 30-pack-year history of smoking and currently smoke or have quit within the past 15 years. Fecal occult blood test (FOBT) of the stool. You may have this test every year starting at age 24. Flexible sigmoidoscopy or colonoscopy. You may have a sigmoidoscopy every 5 years or a colonoscopy every 10 years starting at age 47. Prostate cancer screening. Recommendations will vary depending on your family history and other risks. Hepatitis C blood test. Hepatitis B blood test. Sexually transmitted disease (STD) testing. Diabetes screening. This is done by checking your blood sugar (glucose) after you have not eaten for a while (fasting). You may have this done every 1-3 years.  Abdominal aortic aneurysm (AAA) screening. You may need this if you are a current or former smoker. Osteoporosis. You may be screened starting at age 39 if you are at high risk. Talk with your health care provider about your test results, treatment options, and if necessary, the need for more tests. Vaccines  Your health care provider may recommend certain vaccines, such as: Influenza vaccine. This  is recommended every year. Tetanus, diphtheria, and acellular pertussis (Tdap, Td) vaccine. You may need a Td booster every 10 years. Zoster vaccine. You may need this after age 46. Pneumococcal 13-valent conjugate (PCV13) vaccine. One dose is recommended after age 46. Pneumococcal polysaccharide (PPSV23) vaccine. One dose is recommended after age 3. Talk to your health care provider about which screenings and vaccines you need and how often you need them. This information is not intended to replace advice given to you by your health care provider. Make sure you discuss any questions you have with your health care provider. Document Released: 10/06/2015 Document Revised: 05/29/2016 Document Reviewed: 07/11/2015 Elsevier Interactive Patient Education  2017 [place] Prevention in the Home Falls can cause injuries. They can happen to people of all ages. There are many things you can do to make your home safe and to help prevent falls. What can I do on the outside of my home? Regularly fix the edges of walkways and driveways and fix any cracks. Remove anything that might make you trip as you walk through a door, such as a raised step or threshold. Trim any bushes or trees on the path to your home. Use bright outdoor lighting. Clear any walking paths of anything that might make someone trip, such as rocks or tools. Regularly check to see if handrails are loose or broken. Make sure that both sides of any steps have handrails. Any raised decks and porches should have guardrails on the edges. Have any leaves, snow, or ice cleared regularly. Use sand or salt on walking paths during winter. Clean up any spills in your garage right away. This includes oil or grease spills. What can I do in the bathroom? Use night lights. Install grab bars by the toilet and in the tub and shower. Do not use towel bars as grab bars. Use non-skid mats or decals in the tub or shower. If you need to sit down  in the shower, use a plastic, non-slip stool. Keep the floor dry. Clean up any water that spills on the floor as soon as it happens. Remove soap buildup in the tub or shower regularly. Attach bath mats securely with double-sided non-slip rug tape. Do not have throw rugs and other things on the floor that can make you trip. What can I do in the bedroom? Use night lights. Make sure that you have a light by your bed that is easy to reach. Do not use any sheets or blankets that are too big for your bed. They should not hang down onto the floor. Have a firm chair that has side arms. You can use this for support while you get dressed. Do not have throw rugs and other things on the floor that can make you trip. What can I do in the kitchen? Clean up any spills right away. Avoid walking on wet floors. Keep items that you use a lot in easy-to-reach places. If you need to reach something above you, use a strong step stool that has a grab bar. Keep electrical cords out of the way. Do not  use floor polish or wax that makes floors slippery. If you must use wax, use non-skid floor wax. Do not have throw rugs and other things on the floor that can make you trip. What can I do with my stairs? Do not leave any items on the stairs. Make sure that there are handrails on both sides of the stairs and use them. Fix handrails that are broken or loose. Make sure that handrails are as long as the stairways. Check any carpeting to make sure that it is firmly attached to the stairs. Fix any carpet that is loose or worn. Avoid having throw rugs at the top or bottom of the stairs. If you do have throw rugs, attach them to the floor with carpet tape. Make sure that you have a light switch at the top of the stairs and the bottom of the stairs. If you do not have them, ask someone to add them for you. What else can I do to help prevent falls? Wear shoes that: Do not have high heels. Have rubber bottoms. Are comfortable  and fit you well. Are closed at the toe. Do not wear sandals. If you use a stepladder: Make sure that it is fully opened. Do not climb a closed stepladder. Make sure that both sides of the stepladder are locked into place. Ask someone to hold it for you, if possible. Clearly mark and make sure that you can see: Any grab bars or handrails. First and last steps. Where the edge of each step is. Use tools that help you move around (mobility aids) if they are needed. These include: Canes. Walkers. Scooters. Crutches. Turn on the lights when you go into a dark area. Replace any light bulbs as soon as they burn out. Set up your furniture so you have a clear path. Avoid moving your furniture around. If any of your floors are uneven, fix them. If there are any pets around you, be aware of where they are. Review your medicines with your doctor. Some medicines can make you feel dizzy. This can increase your chance of falling. Ask your doctor what other things that you can do to help prevent falls. This information is not intended to replace advice given to you by your health care provider. Make sure you discuss any questions you have with your health care provider. Document Released: 07/06/2009 Document Revised: 02/15/2016 Document Reviewed: 10/14/2014 Elsevier Interactive Patient Education  2017 [employer].

## 2023-01-02 ENCOUNTER — Other Ambulatory Visit (INDEPENDENT_AMBULATORY_CARE_PROVIDER_SITE_OTHER): Payer: Medicare Other

## 2023-01-02 DIAGNOSIS — D509 Iron deficiency anemia, unspecified: Secondary | ICD-10-CM | POA: Diagnosis not present

## 2023-01-02 LAB — CBC WITH DIFFERENTIAL/PLATELET
Basophils Absolute: 0 10*3/uL (ref 0.0–0.1)
Basophils Relative: 1.4 % (ref 0.0–3.0)
Eosinophils Absolute: 0 10*3/uL (ref 0.0–0.7)
Eosinophils Relative: 0.3 % (ref 0.0–5.0)
HCT: 30 % — ABNORMAL LOW (ref 39.0–52.0)
Hemoglobin: 9.9 g/dL — ABNORMAL LOW (ref 13.0–17.0)
Lymphocytes Relative: 35.1 % (ref 12.0–46.0)
Lymphs Abs: 1.3 10*3/uL (ref 0.7–4.0)
MCHC: 33.1 g/dL (ref 30.0–36.0)
MCV: 82.2 fl (ref 78.0–100.0)
Monocytes Absolute: 0.8 10*3/uL (ref 0.1–1.0)
Monocytes Relative: 21.3 % — ABNORMAL HIGH (ref 3.0–12.0)
Neutro Abs: 1.5 10*3/uL (ref 1.4–7.7)
Neutrophils Relative %: 41.9 % — ABNORMAL LOW (ref 43.0–77.0)
Platelets: 176 10*3/uL (ref 150.0–400.0)
RBC: 3.65 Mil/uL — ABNORMAL LOW (ref 4.22–5.81)
RDW: 16.3 % — ABNORMAL HIGH (ref 11.5–15.5)
WBC: 3.6 10*3/uL — ABNORMAL LOW (ref 4.0–10.5)

## 2023-01-02 LAB — BASIC METABOLIC PANEL
BUN: 27 mg/dL — ABNORMAL HIGH (ref 6–23)
CO2: 24 mEq/L (ref 19–32)
Calcium: 8.9 mg/dL (ref 8.4–10.5)
Chloride: 108 mEq/L (ref 96–112)
Creatinine, Ser: 2.31 mg/dL — ABNORMAL HIGH (ref 0.40–1.50)
GFR: 24.53 mL/min — ABNORMAL LOW (ref >60.00)
Glucose, Bld: 114 mg/dL — ABNORMAL HIGH (ref 70–99)
Potassium: 5 mEq/L (ref 3.5–5.1)
Sodium: 141 mEq/L (ref 135–145)

## 2023-01-02 LAB — IRON: Iron: 127 ug/dL (ref 42–165)

## 2023-01-02 LAB — FOLATE: Folate: 11.3 ng/mL (ref >5.9)

## 2023-01-09 NOTE — Progress Notes (Signed)
Cardiology Clinic Note   Date: 01/10/2023 ID: John Hines, DOB Oct 02, 1931, MRN [id number]  Primary Cardiologist:  Nicki Guadalajara, MD  Patient Profile    John Hines is a 89 y.o. male who presents to the clinic today for overdue follow-up.  Past medical history significant for: Chronic systolic heart failure. Echo 08/31/2018: EF 30 to 35%.  Grade I DD.  Mild calcification/thickening of aortic valve.  Trivial AI.  Mild LAE.  (EF slightly improved from prior echo May 2019). PAF/a-flutter. Onset ~October 2020.  Paroxysmal. IV amiodarone transition to p.o. and started on anticoagulation. PAD. LBBB. Hypertension. Hyperlipidemia. Lipid panel 11/26/2022: LDL 66, HDL 43, TG 56, total 119. Hypothyroidism. CKD stage. Congential nonfunctioning kidney. Stage IV: GFR 24.53 April 2024. CVA.   History of Present Illness    John Hines is a longtime patient of Dr. Tresa Endo followed for the above outlined history.  He was seen for initial evaluation with Dr. Tresa Endo in 2012.  He then reestablished on 10/25/2013 at the request of Dr. Durwin Nora.  He was not seen again until he reestablished once again on 01/22/2018 and has been followed ever since.  He is also a patient of Dr. Elberta Fortis first seen on 10/16/2018 for evaluation of chronic systolic and diastolic heart failure and possibility of ICD placement.  Dr. Elberta Fortis did not feel it was indicated at that point.  Patient was last seen in the office by Dr. Elberta Fortis on 11/29/2021.  He was doing well at that time and no changes were made.  Patient has not seen Dr. Tresa Endo since 09/28/2018.  More recently, patient's daughter called into triage on 12/03/2022 to report patient with edema to bilateral hands and feet.  She initially contacted PCP advised patient continue current medications, reduce sodium intake, and get labs to check on kidney function.  Labs drawn on 01/02/2023 showed anemia with hemoglobin 9.9, high normal potassium, and worsened kidney function.  Patient  was referred to nephrology.  Today, patient is accompanied by his daughter.  He reports a 41-month history of R>L lower extremity edema and right hand edema.  He has limited range of motion in his right hand secondary to arthritis.  He is unsure if the swelling in his right hand is secondary to fluid or his arthritic symptoms.  Patient denies shortness of breath or dyspnea on exertion. No chest pain, pressure, or tightness. Denies orthopnea or PND. No palpitations. No abdominal fullness/bloating or early satiety.  He stays active gardening and doing yard work.  He is not having any difficulty performing these tasks.  He denies bleeding concerns.  He has a congenital nonfunctioning kidney.  Visit pending with nephrology 02/12/2023.     ROS: All other systems reviewed and are otherwise negative except as noted in History of Present Illness.  Studies Reviewed    ECG personally reviewed by me today: NSR with sinus arrhythmia, LBBB, 62 bpm.  No significant changes from 11/29/2021.  Risk Assessment/Calculations     CHA2DS2-VASc Score = 6   This indicates a 9.7% annual risk of stroke. The patient's score is based upon: CHF History: 1 HTN History: 1 Diabetes History: 0 Stroke History: 2 Vascular Disease History: 0 Age Score: 2 Gender Score: 0             Physical Exam    VS:  BP 132/84   Pulse 62   Ht  (1.702 m)   Wt 173 lb 6.4 oz (78.7 kg)   SpO2 98%  BMI 27.16 kg/m  , BMI Body mass index is 27.16 kg/m.  GEN: Well nourished, well developed, in no acute distress. Neck: No JVD or carotid bruits. Cardiac:  RRR. No murmurs. No rubs or gallops.   Respiratory:  Respirations regular and unlabored. Clear to auscultation without rales, wheezing or rhonchi. GI: Soft, nontender, nondistended. Extremities: Radials/DP/PT 2+ and equal bilaterally. No clubbing or cyanosis.  Moderate nonpitting edema bilateral lower extremities right slightly greater than left.  Mild swelling right hand with  limited range of motion question edema versus arthritic changes. Skin: Warm and dry, no rash. Neuro: Strength intact.  Assessment & Plan   Chronic systolic heart failure/lower extremity edema.  Echo December 2019 showed EF 30 to 35%, Grade I DD.  Patient reports 84-month history of R>L lower extremity edema.  No shortness of breath, DOE, orthopnea, or PND.  Patient is able to perform normal activities including gardening and yard work without any difficulty. Moderate non-pitting edema bilateral lower extremities, right very slightly > left. Given his worsening kidney function and pending visit with nephrology, I hesitate to add a diuretic.   Continue spironolactone, Entresto, carvedilol.  Will repeat echo. Addendum: In reviewing chart I decided to get BNP for further evaluation. If it is elevated, will consider a short course of diuresis and prn thereafter. Patient will be contacted to come in for lab work.  PAF/A-flutter.  Onset October 2020.  Patient denies palpitations.  No spontaneous bleeding concerns.  EKG today NSR with sinus arrhythmia, LBBB, 62 bpm.  Continue amiodarone, carvedilol, Eliquis. Appropriate Eliquis dose.  TSH and LFTs stable as of March 2024. Hypertension.  BP today 132/84.  Patient denies dizziness or headaches.  Continue amlodipine, carvedilol, Entresto, spironolactone. Hyperlipidemia.  LDL March 2024 66, at goal.  Continue atorvastatin. CKD, stage IV.  BMP 01/02/2023: Creatinine 2.31, GFR 24.53.  Patient has visit pending with nephrology 02/12/2023.  Disposition: Echo.  BNP.  Return in 2 to 3 months or sooner as needed.         Signed, Etta Grandchild. Barret Esquivel, DNP, NP-C

## 2023-01-10 ENCOUNTER — Ambulatory Visit: Payer: Medicare Other | Attending: Student | Admitting: Student

## 2023-01-10 ENCOUNTER — Encounter: Payer: Self-pay | Admitting: Student

## 2023-01-10 ENCOUNTER — Encounter: Payer: Self-pay | Admitting: Family Medicine

## 2023-01-10 ENCOUNTER — Ambulatory Visit (INDEPENDENT_AMBULATORY_CARE_PROVIDER_SITE_OTHER): Payer: Medicare Other | Admitting: Family Medicine

## 2023-01-10 VITALS — BP 134/52 | HR 68 | Temp 97.8°F | Ht 67.0 in | Wt 172.0 lb

## 2023-01-10 VITALS — BP 132/84 | HR 62 | Ht 67.0 in | Wt 173.4 lb

## 2023-01-10 DIAGNOSIS — I480 Paroxysmal atrial fibrillation: Secondary | ICD-10-CM

## 2023-01-10 DIAGNOSIS — I5022 Chronic systolic (congestive) heart failure: Secondary | ICD-10-CM | POA: Diagnosis not present

## 2023-01-10 DIAGNOSIS — E782 Mixed hyperlipidemia: Secondary | ICD-10-CM

## 2023-01-10 DIAGNOSIS — E039 Hypothyroidism, unspecified: Secondary | ICD-10-CM | POA: Diagnosis not present

## 2023-01-10 DIAGNOSIS — I10 Essential (primary) hypertension: Secondary | ICD-10-CM

## 2023-01-10 DIAGNOSIS — N184 Chronic kidney disease, stage 4 (severe): Secondary | ICD-10-CM | POA: Diagnosis not present

## 2023-01-10 DIAGNOSIS — R600 Localized edema: Secondary | ICD-10-CM | POA: Diagnosis not present

## 2023-01-10 DIAGNOSIS — I4892 Unspecified atrial flutter: Secondary | ICD-10-CM

## 2023-01-10 DIAGNOSIS — N183 Chronic kidney disease, stage 3 unspecified: Secondary | ICD-10-CM

## 2023-01-10 MED ORDER — LEVOTHYROXINE SODIUM 75 MCG PO TABS: 75.0000 ug | ORAL_TABLET | Freq: Every day | ORAL | 1 refills | Status: DC

## 2023-01-10 MED ORDER — APIXABAN 2.5 MG PO TABS: 2.5000 mg | ORAL_TABLET | Freq: Two times a day (BID) | ORAL | 2 refills | Status: DC

## 2023-01-10 NOTE — Patient Instructions (Addendum)
You could take tylenol if needed for pain.  I'll await the notes from cardiology and renal.   You would try OTC compression stockings.   You could try putting them on the AM and take off in the PM.   Recheck nonfasting labs in about 2 weeks.   You can eat and drink prior to labs.   Take care.  Glad to see you.

## 2023-01-10 NOTE — Patient Instructions (Signed)
Medication Instructions:  Your physician recommends that you continue on your current medications as directed. Please refer to the Current Medication list given to you today.  *If you need a refill on your cardiac medications before your next appointment, please call your pharmacy*   Lab Work: NONE If you have labs (blood work) drawn today and your tests are completely normal, you will receive your results only by: MyChart Message (if you have MyChart) OR A paper copy in the mail If you have any lab test that is abnormal or we need to change your treatment, we will call you to review the results.   Testing/Procedures: Your physician has requested that you have an echocardiogram. Echocardiography is a painless test that uses sound waves to create images of your heart. It provides your doctor with information about the size and shape of your heart and how well your heart's chambers and valves are working. This procedure takes approximately one hour. There are no restrictions for this procedure. Please do NOT wear cologne, perfume, aftershave, or lotions (deodorant is allowed). Please arrive 15 minutes prior to your appointment time.    Follow-Up: At [facility], you and your health needs are our priority.  As part of our continuing mission to provide you with exceptional heart care, we have created designated Provider Care Teams.  These Care Teams include your primary Cardiologist (physician) and Advanced Practice Providers (APPs -  Physician Assistants and Nurse Practitioners) who all work together to provide you with the care you need, when you need it.  We recommend signing up for the patient portal called "MyChart".  Sign up information is provided on this After Visit Summary.  MyChart is used to connect with patients for Virtual Visits (Telemedicine).  Patients are able to view lab/test results, encounter notes, upcoming appointments, etc.  Non-urgent messages can be sent to your  provider as well.   To learn more about what you can do with MyChart, go to [email or web address].    Your next appointment:   2-3 month(s)  Provider:   Nicki Guadalajara, MD

## 2023-01-10 NOTE — Progress Notes (Unsigned)
Saw cardiology this AM with the plan for echo.  The plan was not to inc diuresis.    Goal to manage edema w/o compromising his renal function.   He had renal clinic pending for next month.    No salt.  Discussed using compression stockings.    No CP.  Not SOB.    Meds, vitals, and allergies reviewed.   ROS: Per HPI unless specifically indicated in ROS section   R>L BLE edema.   R MCP puffy, pain with grip.  This may be arthritis related.      Need echo report.

## 2023-01-10 NOTE — Addendum Note (Signed)
Addended by: Carlos Levering on: 01/10/2023 06:16 PM   Modules accepted: Orders

## 2023-01-12 NOTE — Assessment & Plan Note (Signed)
Discussed that he can take Tylenol if needed for pain. I'll await the notes from cardiology and renal.   He would try OTC compression stockings.   He could try putting them on the AM and take off in the PM.   Recheck nonfasting labs in about 2 weeks.  Rationale discussed with patient.  He agrees to plan.  Continue amiodarone and amlodipine Lipitor carvedilol iron Entresto and spironolactone for now.

## 2023-01-13 ENCOUNTER — Telehealth: Payer: Self-pay

## 2023-01-13 DIAGNOSIS — I10 Essential (primary) hypertension: Secondary | ICD-10-CM

## 2023-01-13 DIAGNOSIS — Z79899 Other long term (current) drug therapy: Secondary | ICD-10-CM

## 2023-01-13 DIAGNOSIS — I509 Heart failure, unspecified: Secondary | ICD-10-CM

## 2023-01-13 NOTE — Telephone Encounter (Signed)
Spoke with pts daughter per John Levering NP. Pt is going to come in for labs BNP. Lab orders placed.   Pt's daughter also would like to know if there are any sooner appointments for an ECHO. Pt is currently scheduled at [facility] [address] and pt is willing to go to another locations. Please advise.

## 2023-01-15 DIAGNOSIS — I509 Heart failure, unspecified: Secondary | ICD-10-CM | POA: Diagnosis not present

## 2023-01-15 NOTE — Addendum Note (Signed)
Addended by: Missy Sabins on: 01/15/2023 10:22 AM   Modules accepted: Orders

## 2023-01-16 LAB — BRAIN NATRIURETIC PEPTIDE: BNP: 119.5 pg/mL — ABNORMAL HIGH (ref 0.0–100.0)

## 2023-01-24 ENCOUNTER — Other Ambulatory Visit (INDEPENDENT_AMBULATORY_CARE_PROVIDER_SITE_OTHER): Payer: Medicare Other

## 2023-01-24 DIAGNOSIS — I10 Essential (primary) hypertension: Secondary | ICD-10-CM | POA: Diagnosis not present

## 2023-01-24 LAB — BASIC METABOLIC PANEL
BUN: 33 mg/dL — ABNORMAL HIGH (ref 6–23)
CO2: 25 mEq/L (ref 19–32)
Calcium: 8.9 mg/dL (ref 8.4–10.5)
Chloride: 109 mEq/L (ref 96–112)
Creatinine, Ser: 2.18 mg/dL — ABNORMAL HIGH (ref 0.40–1.50)
GFR: 26.28 mL/min — ABNORMAL LOW (ref >60.00)
Glucose, Bld: 108 mg/dL — ABNORMAL HIGH (ref 70–99)
Potassium: 4.5 mEq/L (ref 3.5–5.1)
Sodium: 140 mEq/L (ref 135–145)

## 2023-01-24 LAB — CBC WITH DIFFERENTIAL/PLATELET
Basophils Absolute: 0 10*3/uL (ref 0.0–0.1)
Basophils Relative: 0.3 % (ref 0.0–3.0)
Eosinophils Absolute: 0 10*3/uL (ref 0.0–0.7)
Eosinophils Relative: 0.4 % (ref 0.0–5.0)
HCT: 29.7 % — ABNORMAL LOW (ref 39.0–52.0)
Hemoglobin: 9.9 g/dL — ABNORMAL LOW (ref 13.0–17.0)
Lymphocytes Relative: 34.5 % (ref 12.0–46.0)
Lymphs Abs: 1.3 10*3/uL (ref 0.7–4.0)
MCHC: 33.2 g/dL (ref 30.0–36.0)
MCV: 81.4 fl (ref 78.0–100.0)
Monocytes Absolute: 0.9 10*3/uL (ref 0.1–1.0)
Monocytes Relative: 22.4 % — ABNORMAL HIGH (ref 3.0–12.0)
Neutro Abs: 1.6 10*3/uL (ref 1.4–7.7)
Neutrophils Relative %: 42.4 % — ABNORMAL LOW (ref 43.0–77.0)
Platelets: 207 10*3/uL (ref 150.0–400.0)
RBC: 3.65 Mil/uL — ABNORMAL LOW (ref 4.22–5.81)
RDW: 16.4 % — ABNORMAL HIGH (ref 11.5–15.5)
WBC: 3.8 10*3/uL — ABNORMAL LOW (ref 4.0–10.5)

## 2023-01-30 ENCOUNTER — Other Ambulatory Visit: Payer: Self-pay

## 2023-01-30 ENCOUNTER — Ambulatory Visit (HOSPITAL_COMMUNITY): Payer: Medicare Other

## 2023-01-30 MED ORDER — AMIODARONE HCL 200 MG PO TABS: 100.0000 mg | ORAL_TABLET | Freq: Every day | ORAL | 1 refills | Status: DC

## 2023-01-31 ENCOUNTER — Ambulatory Visit (HOSPITAL_COMMUNITY): Payer: Medicare Other | Attending: Student

## 2023-01-31 DIAGNOSIS — I5022 Chronic systolic (congestive) heart failure: Secondary | ICD-10-CM | POA: Diagnosis not present

## 2023-01-31 LAB — ECHOCARDIOGRAM COMPLETE
Area-P 1/2: 3.72 cm2
S' Lateral: 4.3 cm

## 2023-02-01 ENCOUNTER — Other Ambulatory Visit: Payer: Self-pay | Admitting: Cardiology

## 2023-02-04 ENCOUNTER — Other Ambulatory Visit: Payer: Self-pay

## 2023-02-04 MED ORDER — CARVEDILOL 12.5 MG PO TABS: 12.5000 mg | ORAL_TABLET | Freq: Two times a day (BID) | ORAL | 3 refills | Status: DC

## 2023-02-08 ENCOUNTER — Other Ambulatory Visit: Payer: Self-pay | Admitting: Cardiology

## 2023-02-11 ENCOUNTER — Other Ambulatory Visit (HOSPITAL_COMMUNITY): Payer: Medicare Other

## 2023-02-12 DIAGNOSIS — N183 Chronic kidney disease, stage 3 unspecified: Secondary | ICD-10-CM | POA: Diagnosis not present

## 2023-02-12 DIAGNOSIS — I4892 Unspecified atrial flutter: Secondary | ICD-10-CM | POA: Diagnosis not present

## 2023-02-12 DIAGNOSIS — I129 Hypertensive chronic kidney disease with stage 1 through stage 4 chronic kidney disease, or unspecified chronic kidney disease: Secondary | ICD-10-CM | POA: Diagnosis not present

## 2023-03-09 IMAGING — CR DG CHEST 1V
1 series · 1 of 1 positions shown · non-contrast
Comparison: Chest radiograph dated 07/11/2019.

CLINICAL DATA: 89-year-old male with cough.

EXAM:
CHEST  1 VIEW

[w chest pa]
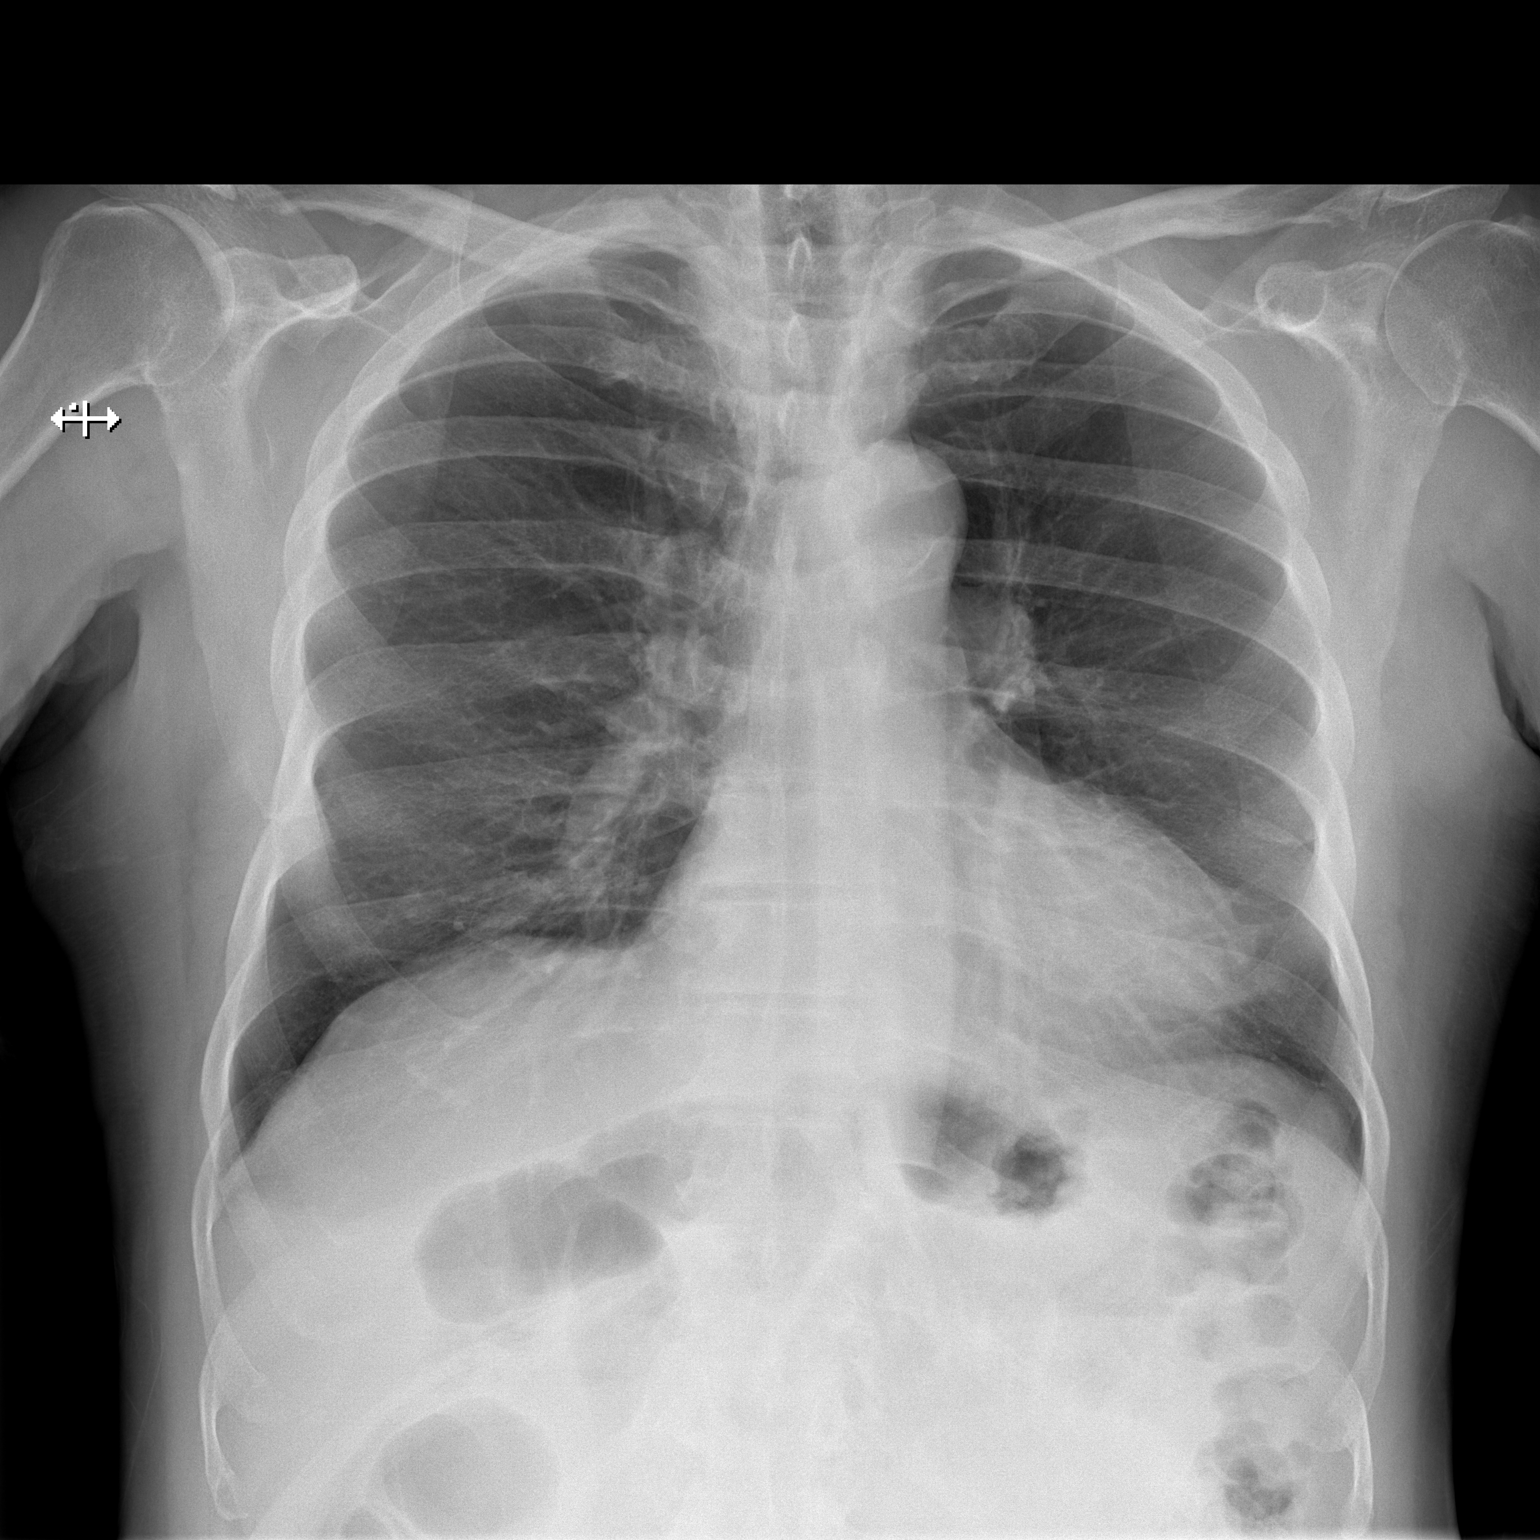

[1 of 1 positions shown; findings below may reference images not displayed]

FINDINGS: No focal consolidation, pleural effusion or pneumothorax the cardiac
silhouette is within limits. Atherosclerotic calcification the
aortic arch. No acute osseous pathology.
IMPRESSION: No active disease.

## 2023-04-12 ENCOUNTER — Other Ambulatory Visit: Payer: Self-pay | Admitting: Cardiovascular Disease

## 2023-04-18 ENCOUNTER — Other Ambulatory Visit: Payer: Self-pay | Admitting: Cardiovascular Disease

## 2023-05-24 ENCOUNTER — Other Ambulatory Visit: Payer: Self-pay | Admitting: Physician Assistant

## 2023-06-02 ENCOUNTER — Telehealth: Payer: Self-pay | Admitting: Family Medicine

## 2023-06-02 NOTE — Telephone Encounter (Signed)
Called and spoke with patients daughter. She stated they were all together for dinner and stuff yesterday and her daughter got really sick. Went to the doctor today and the granddaughter tested positive for the flu. Darlene wants to know if anything preventive can be sent in for the patient since they were so close to him yesterday. He is not currently showing any sx.

## 2023-06-02 NOTE — Telephone Encounter (Signed)
Patient's daughter Prinston Pianka requested a call back from clinical staff when able, can be reached at [phone] (okay per DPR)

## 2023-06-03 MED ORDER — OSELTAMIVIR PHOSPHATE 30 MG PO CAPS: 30.0000 mg | ORAL_CAPSULE | ORAL | 0 refills | Status: DC

## 2023-06-03 NOTE — Addendum Note (Signed)
Addended by: Joaquim Nam on: 06/03/2023 07:02 AM   Modules accepted: Orders

## 2023-06-03 NOTE — Telephone Encounter (Signed)
Daughter returned call and was advised of rx and directions. Advised to call back if he happens to become symptomatic

## 2023-06-03 NOTE — Telephone Encounter (Signed)
He can take Tamiflu 30 mg every other day for 5 doses/10 days.  I sent the prescription.  If he becomes symptomatic then please let us know.  Thanks.

## 2023-06-03 NOTE — Telephone Encounter (Signed)
LM on VM that rx was sent and advised to call back.

## 2023-06-23 ENCOUNTER — Ambulatory Visit: Payer: Medicare Other | Admitting: Cardiovascular Disease

## 2023-07-01 ENCOUNTER — Telehealth: Payer: Self-pay | Admitting: Family Medicine

## 2023-07-01 ENCOUNTER — Ambulatory Visit (INDEPENDENT_AMBULATORY_CARE_PROVIDER_SITE_OTHER): Payer: Medicare Other | Admitting: Family Medicine

## 2023-07-01 ENCOUNTER — Encounter: Payer: Self-pay | Admitting: Family Medicine

## 2023-07-01 VITALS — BP 122/64 | HR 67 | Temp 98.8°F | Ht 67.0 in | Wt 165.0 lb

## 2023-07-01 DIAGNOSIS — M791 Myalgia, unspecified site: Secondary | ICD-10-CM | POA: Diagnosis not present

## 2023-07-01 LAB — CK: Total CK: 69 U/L (ref 7–232)

## 2023-07-01 LAB — COMPREHENSIVE METABOLIC PANEL
ALT: 8 U/L (ref 0–53)
AST: 10 U/L (ref 0–37)
Albumin: 4.3 g/dL (ref 3.5–5.2)
Alkaline Phosphatase: 107 U/L (ref 39–117)
BUN: 42 mg/dL — ABNORMAL HIGH (ref 6–23)
CO2: 24 meq/L (ref 19–32)
Calcium: 9.1 mg/dL (ref 8.4–10.5)
Chloride: 109 meq/L (ref 96–112)
Creatinine, Ser: 2.57 mg/dL — ABNORMAL HIGH (ref 0.40–1.50)
GFR: 21.5 mL/min — ABNORMAL LOW (ref >60.00)
Glucose, Bld: 119 mg/dL — ABNORMAL HIGH (ref 70–99)
Potassium: 5.2 meq/L — ABNORMAL HIGH (ref 3.5–5.1)
Sodium: 140 meq/L (ref 135–145)
Total Bilirubin: 0.5 mg/dL (ref 0.2–1.2)
Total Protein: 6.7 g/dL (ref 6.0–8.3)

## 2023-07-01 LAB — CBC WITH DIFFERENTIAL/PLATELET
Basophils Absolute: 0 10*3/uL (ref 0.0–0.1)
Basophils Relative: 0.6 % (ref 0.0–3.0)
Eosinophils Absolute: 0 10*3/uL (ref 0.0–0.7)
Eosinophils Relative: 0.3 % (ref 0.0–5.0)
HCT: 31.6 % — ABNORMAL LOW (ref 39.0–52.0)
Hemoglobin: 10.3 g/dL — ABNORMAL LOW (ref 13.0–17.0)
Lymphocytes Relative: 34.8 % (ref 12.0–46.0)
Lymphs Abs: 1.6 10*3/uL (ref 0.7–4.0)
MCHC: 32.5 g/dL (ref 30.0–36.0)
MCV: 81.5 fL (ref 78.0–100.0)
Monocytes Absolute: 1.1 10*3/uL — ABNORMAL HIGH (ref 0.1–1.0)
Monocytes Relative: 22.8 % — ABNORMAL HIGH (ref 3.0–12.0)
Neutro Abs: 2 10*3/uL (ref 1.4–7.7)
Neutrophils Relative %: 41.5 % — ABNORMAL LOW (ref 43.0–77.0)
Platelets: 206 10*3/uL (ref 150.0–400.0)
RBC: 3.88 Mil/uL — ABNORMAL LOW (ref 4.22–5.81)
RDW: 16.6 % — ABNORMAL HIGH (ref 11.5–15.5)
WBC: 4.7 10*3/uL (ref 4.0–10.5)

## 2023-07-01 MED ORDER — ATORVASTATIN CALCIUM 80 MG PO TABS: ORAL_TABLET | ORAL | Status: DC

## 2023-07-01 NOTE — Patient Instructions (Signed)
Go to the lab on the way out.   If you have mychart we'll likely use that to update you.    Take care.  Glad to see you. Stop lipitor in the meantime and update me in about 1 week, sooner if needed.

## 2023-07-01 NOTE — Progress Notes (Unsigned)
Aches.  From the ankles to the neck.  Occ HA.  Started last week.  No clear trigger.  No FCNAVD.  No sweats.  Pain with sneezing.  Rare cough.  R>L but B pain.  No rash.  No tick bites recently.  Pain is variable but constant.  More pain in the AM, some better walking.    Meds, vitals, and allergies reviewed.   ROS: Per HPI unless specifically indicated in ROS section   GEN: nad, alert and oriented HEENT: mucous membranes moist, OP wnl.  NECK: supple w/o LA CV: rrr.  PULM: ctab, no inc wob ABD: soft, +bs EXT: no edema SKIN: no acute rash

## 2023-07-01 NOTE — Telephone Encounter (Signed)
Patient daughter called in and wanted to know while he is off his medication should they adjust his diet. Please advise. Thank you!

## 2023-07-02 DIAGNOSIS — M791 Myalgia, unspecified site: Secondary | ICD-10-CM | POA: Insufficient documentation

## 2023-07-02 NOTE — Telephone Encounter (Signed)
Spoke with daughter and advised her not to change patients diet per Dr. Para March.

## 2023-07-02 NOTE — Assessment & Plan Note (Signed)
Unclear source.  See notes on labs.  History of chronic creatinine elevation at baseline.  Discussed that I would not expect his creatinine to be normal given his baseline. Stop lipitor in the meantime and update me in about 1 week, sooner if needed.  Unclear if statin is contributing to his muscle aches.  Okay for outpatient follow-up.

## 2023-07-02 NOTE — Telephone Encounter (Signed)
I wouldn't change his diet- I would only make 1 change at a time.  Thanks.

## 2023-07-18 ENCOUNTER — Ambulatory Visit (INDEPENDENT_AMBULATORY_CARE_PROVIDER_SITE_OTHER): Payer: Medicare Other | Admitting: Family Medicine

## 2023-07-18 ENCOUNTER — Encounter: Payer: Self-pay | Admitting: Family Medicine

## 2023-07-18 VITALS — BP 124/64 | HR 59 | Temp 98.6°F | Ht 67.0 in | Wt 168.2 lb

## 2023-07-18 DIAGNOSIS — M545 Low back pain, unspecified: Secondary | ICD-10-CM

## 2023-07-18 MED ORDER — ACETAMINOPHEN 500 MG PO TABS: 500.0000 mg | ORAL_TABLET | Freq: Three times a day (TID) | ORAL | Status: AC | PRN

## 2023-07-18 NOTE — Patient Instructions (Signed)
Use tylenol if needed.  Use the back stretches.  Don't take aleve or ibuprofen.  Don't sleep in the recliner.  Okay to try heat vs ice.    When better, it would be reasonable to try restart atorvastatin.  If you have more aches on the med then let me know.  Take care.  Glad to see you.

## 2023-07-18 NOTE — Progress Notes (Unsigned)
Recent symptoms are R buttock and R leg pain.  Taking tylenol prn with some partial relief.  Persistent R buttock pain.  Occ L buttock pain.   R but not L leg pain.  Can radiate down to the R ankle.  Pain with twisting.  Pain getting out of the chair.  Pain sitting.  Sleeping in the recliner some but not every night.  Sleeping better in the bed.    Still off lipitor.  Pain from the waist up got better in the meantime, from last OV.  Discussed that his previous pain from the waist up that improved could have been related to Lipitor and stopping the medication could have helped.  He also could have had a separate issue that caused the muscle aches and improved incidentally/unrelated to Lipitor cessation.  No neck pain.  No FCANVD.    Meds, vitals, and allergies reviewed.   ROS: Per HPI unless specifically indicated in ROS section   Nad Ncat Neck supple, no LA Rrr ctab Abdomen soft.  Nontender. B SLR neg Back nontender in the midline but he has right buttock pain with twisting.  Able to bear weight. Strength and sensation grossly intact in lower extremities bilaterally.

## 2023-07-19 NOTE — Assessment & Plan Note (Signed)
Presumed sciatica.  Anatomy and home exercise program discussed with patient.  Discussed options. Use tylenol if needed.  Use the back stretches per handout. Don't take aleve or ibuprofen.  Don't sleep in the recliner.  Okay to try heat vs ice.    When better, it would be reasonable to try restart atorvastatin.  If having more aches on the med then let me know.

## 2023-07-20 NOTE — Progress Notes (Deleted)
Cardiology Office Note:  .   Date:  07/20/2023  ID:  Hassel Neth, DOB May 30, 1932, MRN [id number] PCP: Joaquim Nam, MD   HeartCare Providers Cardiologist:  Nicki Guadalajara, MD { History of Present Illness: .   John Hines is a 89 y.o. male with a past medical history of PAF, HTN, chronic systolic heart failure, hypothyroidism, CKD and solitary kidney, history of CVA. Patient is followed by Dr. Tresa Endo and presents today for a 6 month follow up appointment.   In 06/2010, EF was 25-30%. Repeat echo in 10/2010 showed EF 35-45%, grade II DD, LBBB related asynchrony. He was lost to cardiology follow up from 2015-2019. When he reestablished care with cardiology in 08/2018, echocardiogram showed EF 30-35%, diffuse hypokinesis, grade I DD. Diagnosed with atrial fib/flutter in 06/2019 and was seen by Dr. Elberta Fortis with EP. He was started amiodarone and eliquis at that time. Also treated with carvedilol.   Patient was last seen by cardiology on 01/10/23. At that time, patient was being followed by nephrology. He complained of lower extremity and right hand edema. Denied chest pain, shortness of breath, orthopnea, PND. Remained on spironolactone, entresto, carvedilol. He was not started on additional diuretic due to worsening kidney function. Underwent echocardiogram on 01/31/23 that showed EF 35-40%, global hypokinesis, normal diastolic parameters, normal RV function, normal pulmonary artery systolic pressure.   Chronic Systolic Heart Failure  LBBB - EF was previously as low as 25-30% in 2011. Most recent echocardiogram from 01/2023 that showed EF 35-40%, global hypokinesis, normal RV function, normal PA systolic pressure - Currently on carvedilol 12.5 mg BID, entresto 97-103 mg BID, spironolactone 12.5 mg daily  - Followed by nephrology--  - Chronic LBBB- likely contributing to reduced EF   HTN   PAF  - Patient was first diagnosed with afib/flutter in 06/2019 - Continue amiodarone 100 mg  daily - LFTs, TSH stable as of 11/2022  - Continue carvedilol 12.5 mg BID - Continue eliquis 2.5 mg BID (dose based on age, kidney function)   CKD stage IV  - Lab work from 10/8 showed K 5.2, creatinine 2.57, eGFR 21.5 - Followed by nephrology   HLD  - Was previously on atorvastatin, but developed body aches.  - Lipid panel from 11/2022 showed LDL 66   ROS: ***  Studies Reviewed: .        *** Risk Assessment/Calculations:   {Does this patient have ATRIAL FIBRILLATION?[phone]} No BP recorded.  {Refresh Note OR Click here to enter BP  :1}***       Physical Exam:   VS:  There were no vitals taken for this visit.   Wt Readings from Last 3 Encounters:  07/18/23 168 lb 4 oz (76.3 kg)  07/01/23 165 lb (74.8 kg)  01/10/23 172 lb (78 kg)    GEN: Well nourished, well developed in no acute distress NECK: No JVD; No carotid bruits CARDIAC: ***RRR, no murmurs, rubs, gallops RESPIRATORY:  Clear to auscultation without rales, wheezing or rhonchi  ABDOMEN: Soft, non-tender, non-distended EXTREMITIES:  No edema; No deformity   ASSESSMENT AND PLAN: .   ***    {Are you ordering a CV Procedure (e.g. stress test, cath, DCCV, TEE, etc)?   Press F2        :[id number]}  Dispo: ***  Signed, Jonita Albee, PA-C

## 2023-07-22 ENCOUNTER — Ambulatory Visit: Payer: Medicare Other | Admitting: Cardiology

## 2023-07-31 ENCOUNTER — Ambulatory Visit: Payer: Medicare Other | Admitting: Family Medicine

## 2023-08-03 ENCOUNTER — Other Ambulatory Visit: Payer: Self-pay | Admitting: Family Medicine

## 2023-08-03 DIAGNOSIS — E039 Hypothyroidism, unspecified: Secondary | ICD-10-CM

## 2023-08-06 NOTE — Progress Notes (Deleted)
  Cardiology Office Note:  .   Date:  08/06/2023  ID:  Hassel Neth, DOB 07-27-1932, MRN [id number] PCP: Joaquim Nam, MD  [facility] Providers Cardiologist:  Nicki Guadalajara, MD { History of Present Illness: .   John Hines is a 89 y.o. male with a past medical history of PAF, HTN, chronic systolic heart failure, hypothyroidism, CKD and solitary kidney, history of CVA. Patient is followed by Dr. Tresa Endo and presents today for a 6 month follow up appointment.   In 06/2010, EF was 25-30%. Repeat echo in 10/2010 showed EF 35-45%, grade II DD, LBBB related asynchrony. He was lost to cardiology follow up from 2015-2019. When he reestablished care with cardiology in 08/2018, echocardiogram showed EF 30-35%, diffuse hypokinesis, grade I DD. Diagnosed with atrial fib/flutter in 06/2019 and was seen by Dr. Elberta Fortis with EP. He was started amiodarone and eliquis at that time. Also treated with carvedilol.   Patient was last seen by cardiology on 01/10/23. At that time, patient was being followed by nephrology. He complained of lower extremity and right hand edema. Denied chest pain, shortness of breath, orthopnea, PND. Remained on spironolactone, entresto, carvedilol. He was not started on additional diuretic due to worsening kidney function. Underwent echocardiogram on 01/31/23 that showed EF 35-40%, global hypokinesis, normal diastolic parameters, normal RV function, normal pulmonary artery systolic pressure.   Chronic Systolic Heart Failure  LBBB - EF was previously as low as 25-30% in 2011. Most recent echocardiogram from 01/2023 that showed EF 35-40%, global hypokinesis, normal RV function, normal PA systolic pressure - Currently on carvedilol 12.5 mg BID, entresto 97-103 mg BID, spironolactone 12.5 mg daily  - Followed by nephrology--  - Chronic LBBB- likely contributing to reduced EF   HTN   PAF  - Patient was first diagnosed with afib/flutter in 06/2019 - Continue amiodarone 100 mg  daily - LFTs, TSH stable as of 11/2022  - Continue carvedilol 12.5 mg BID - Continue eliquis 2.5 mg BID (dose based on age, kidney function)   CKD stage IV  - Lab work from 10/8 showed K 5.2, creatinine 2.57, eGFR 21.5 - Followed by nephrology   HLD  - Was previously on atorvastatin, but developed body aches.  - Lipid panel from 11/2022 showed LDL 66   ROS: ***  Studies Reviewed: .        *** Risk Assessment/Calculations:   {Does this patient have ATRIAL FIBRILLATION?:[phone]} No BP recorded.  {Refresh Note OR Click here to enter BP  :1}***       Physical Exam:   VS:  There were no vitals taken for this visit.   Wt Readings from Last 3 Encounters:  07/18/23 168 lb 4 oz (76.3 kg)  07/01/23 165 lb (74.8 kg)  01/10/23 172 lb (78 kg)    GEN: Well nourished, well developed in no acute distress NECK: No JVD; No carotid bruits CARDIAC: ***RRR, no murmurs, rubs, gallops RESPIRATORY:  Clear to auscultation without rales, wheezing or rhonchi  ABDOMEN: Soft, non-tender, non-distended EXTREMITIES:  No edema; No deformity   ASSESSMENT AND PLAN: .   ***    {Are you ordering a CV Procedure (e.g. stress test, cath, DCCV, TEE, etc)?   Press F2        :[id number]}  Dispo: ***  Signed, Jonita Albee, PA-C

## 2023-08-14 DIAGNOSIS — I4892 Unspecified atrial flutter: Secondary | ICD-10-CM | POA: Diagnosis not present

## 2023-08-14 DIAGNOSIS — I129 Hypertensive chronic kidney disease with stage 1 through stage 4 chronic kidney disease, or unspecified chronic kidney disease: Secondary | ICD-10-CM | POA: Diagnosis not present

## 2023-08-14 DIAGNOSIS — E785 Hyperlipidemia, unspecified: Secondary | ICD-10-CM | POA: Diagnosis not present

## 2023-08-14 DIAGNOSIS — E875 Hyperkalemia: Secondary | ICD-10-CM | POA: Diagnosis not present

## 2023-08-14 DIAGNOSIS — N183 Chronic kidney disease, stage 3 unspecified: Secondary | ICD-10-CM | POA: Diagnosis not present

## 2023-08-14 NOTE — Progress Notes (Signed)
Cardiology Office Note:  .   Date:  08/15/2023  ID:  Hassel Neth, DOB 1931-11-30, MRN [id number] PCP: Joaquim Nam, MD  [facility] Providers Cardiologist:  Nicki Guadalajara, MD { History of Present Illness: .   John Hines is a 89 y.o. male with a past medical history of PAF, HTN, chronic systolic heart failure, hypothyroidism, CKD and solitary kidney, history of CVA. Patient is followed by Dr. Tresa Endo and presents today for a 6 month follow up appointment.   In 06/2010, EF was 25-30%. Repeat echo in 10/2010 showed EF 35-45%, grade II DD, LBBB related asynchrony. He was lost to cardiology follow up from 2015-2019. When he reestablished care with cardiology in 08/2018, echocardiogram showed EF 30-35%, diffuse hypokinesis, grade I DD. Diagnosed with atrial fib/flutter in 06/2019 and was seen by Dr. Elberta Fortis with EP. He was started amiodarone and eliquis at that time. Also treated with carvedilol.   Patient was last seen by cardiology on 01/10/23. At that time, patient was being followed by nephrology. He complained of lower extremity and right hand edema. Denied chest pain, shortness of breath, orthopnea, PND. Remained on spironolactone, entresto, carvedilol. He was not started on additional diuretic due to worsening kidney function. Underwent echocardiogram on 01/31/23 that showed EF 35-40%, global hypokinesis, normal diastolic parameters, normal RV function, normal pulmonary artery systolic pressure.   Today the patient, a 89 year old with a history of heart failure, kidney disease, and atrial fibrillation, presents for a six-month follow-up. The patient reports a new onset of pain on the right side of the body, extending from the shoulder to the knee. The pain is managed with Tylenol, taken approximately every twelve hours. The patient denies any known triggers for the pain. The patient was taken off Lipitor due to a suspicion that it might be causing the pain, but the pain persisted. He  is planning on restarting lipitor once he sees his PCP next week.   The patient has been seeing a nephrologist every six months, with the most recent visit being yesterday. The nephrologist took blood and urine samples and suggested the patient might need to reduce the dosage of some medications. The patient reports no chest pain, fluttering, or pounding in the chest, and no issues with breathing, except when overexerting. The patient is still on amiodarone, taken once a day, and Eliquis, with no reported issues. He reports that he is stable from a cardiac standpoint, without new cardiac symptoms or concerns.   The patient denies any swelling in the ankles. Denies chest pain, shortness of breath, palpitations, dizziness, syncope, near sycnope   ROS: per HPI   Studies Reviewed: .   Cardiac Studies & Procedures     STRESS TESTS  NM MYOCAR MULTI W/SPECT W 08/30/2010   ECHOCARDIOGRAM  ECHOCARDIOGRAM COMPLETE 01/31/2023  Narrative ECHOCARDIOGRAM REPORT    Patient Name:   John Hines Date of Exam: 01/31/2023 Medical Rec #:  [id number]        Height:       67.0 in Accession #:    [id number]       Weight:       172.0 lb Date of Birth:  07-29-1932        BSA:          1.897 m Patient Age:    89 years         BP:           134/52 mmHg Patient Gender: M  HR:           67 bpm. Exam Location:  [address]  Procedure: 2D Echo, Cardiac Doppler, Color Doppler and 3D Echo  Indications:    Chronic systolic heart Failure I50.22  History:        Patient has prior history of Echocardiogram examinations, most recent 08/31/2018. CKD; Risk Factors:Hypertension and Dyslipidemia.  Sonographer:    Thurman Coyer RDCS Referring Phys: [id number] [facility] [place]  IMPRESSIONS   1. Abnormal septal motion. Left ventricular ejection fraction, by estimation, is 35 to 40%. The left ventricle has moderately decreased function. The left ventricle demonstrates global hypokinesis. The left  ventricular internal cavity size was moderately dilated. Left ventricular diastolic parameters were normal. 2. Right ventricular systolic function is normal. The right ventricular size is normal. There is normal pulmonary artery systolic pressure. 3. Left atrial size was moderately dilated. 4. The mitral valve is abnormal. Trivial mitral valve regurgitation. No evidence of mitral stenosis. 5. The aortic valve is tricuspid. There is mild calcification of the aortic valve. There is mild thickening of the aortic valve. Aortic valve regurgitation is not visualized. Aortic valve sclerosis/calcification is present, without any evidence of aortic stenosis. 6. The inferior vena cava is normal in size with greater than 50% respiratory variability, suggesting right atrial pressure of 3 mmHg.  FINDINGS Left Ventricle: Abnormal septal motion. Left ventricular ejection fraction, by estimation, is 35 to 40%. The left ventricle has moderately decreased function. The left ventricle demonstrates global hypokinesis. The left ventricular internal cavity size was moderately dilated. There is no left ventricular hypertrophy. Left ventricular diastolic parameters were normal.  Right Ventricle: The right ventricular size is normal. No increase in right ventricular wall thickness. Right ventricular systolic function is normal. There is normal pulmonary artery systolic pressure. The tricuspid regurgitant velocity is 2.35 m/s, and with an assumed right atrial pressure of 3 mmHg, the estimated right ventricular systolic pressure is 25.1 mmHg.  Left Atrium: Left atrial size was moderately dilated.  Right Atrium: Right atrial size was normal in size.  Pericardium: There is no evidence of pericardial effusion.  Mitral Valve: The mitral valve is abnormal. There is mild thickening of the mitral valve leaflet(s). Trivial mitral valve regurgitation. No evidence of mitral valve stenosis.  Tricuspid Valve: The tricuspid valve  is normal in structure. Tricuspid valve regurgitation is trivial. No evidence of tricuspid stenosis.  Aortic Valve: The aortic valve is tricuspid. There is mild calcification of the aortic valve. There is mild thickening of the aortic valve. Aortic valve regurgitation is not visualized. Aortic valve sclerosis/calcification is present, without any evidence of aortic stenosis.  Pulmonic Valve: The pulmonic valve was normal in structure. Pulmonic valve regurgitation is not visualized. No evidence of pulmonic stenosis.  Aorta: The aortic root is normal in size and structure.  Venous: The inferior vena cava is normal in size with greater than 50% respiratory variability, suggesting right atrial pressure of 3 mmHg.  IAS/Shunts: No atrial level shunt detected by color flow Doppler.   LEFT VENTRICLE PLAX 2D LVIDd:         5.60 cm   Diastology LVIDs:         4.30 cm   LV e' medial:    5.27 cm/s LV PW:         1.10 cm   LV E/e' medial:  12.3 LV IVS:        1.10 cm   LV e' lateral:   5.68 cm/s LVOT diam:  2.30 cm   LV E/e' lateral: 11.4 LV SV:         71 LV SV Index:   38 LVOT Area:     4.15 cm  3D Volume EF: 3D EF:        35 % LV EDV:       174 ml LV ESV:       114 ml LV SV:        61 ml  RIGHT VENTRICLE RV Basal diam:  3.30 cm RV Mid diam:    2.90 cm RV S prime:     5.35 cm/s TAPSE (M-mode): 1.6 cm  LEFT ATRIUM             Index        RIGHT ATRIUM           Index LA diam:        4.30 cm 2.27 cm/m   RA Area:     18.60 cm LA Vol (A2C):   74.9 ml 39.49 ml/m  RA Volume:   48.10 ml  25.36 ml/m LA Vol (A4C):   49.3 ml 25.99 ml/m LA Biplane Vol: 63.0 ml 33.22 ml/m AORTIC VALVE LVOT Vmax:   70.30 cm/s LVOT Vmean:  46.300 cm/s LVOT VTI:    0.172 m  AORTA Ao Root diam: 3.40 cm Ao Asc diam:  3.60 cm  MITRAL VALVE               TRICUSPID VALVE MV Area (PHT): 3.72 cm    TR Peak grad:   22.1 mmHg MV Decel Time: 204 msec    TR Vmax:        235.00 cm/s MV E velocity: 64.70  cm/s MV A velocity: 88.30 cm/s  SHUNTS MV E/A ratio:  0.73        Systemic VTI:  0.17 m Systemic Diam: 2.30 cm  Charlton Haws MD Electronically signed by Charlton Haws MD Signature Date/Time: 01/31/2023/8:38:59 AM    Final             Risk Assessment/Calculations:    CHA2DS2-VASc Score = 6   This indicates a 9.7% annual risk of stroke. The patient's score is based upon: CHF History: 1 HTN History: 1 Diabetes History: 0 Stroke History: 2 Vascular Disease History: 0 Age Score: 2 Gender Score: 0  Physical Exam:   VS:  BP 132/62 (BP Location: Left Arm, Patient Position: Sitting, Cuff Size: Normal)   Pulse 68   Ht 5\' 7"  (1.702 m)   Wt 167 lb 6.4 oz (75.9 kg)   SpO2 98%   BMI 26.22 kg/m    Wt Readings from Last 3 Encounters:  08/15/23 167 lb 6.4 oz (75.9 kg)  07/18/23 168 lb 4 oz (76.3 kg)  07/01/23 165 lb (74.8 kg)    GEN: Well nourished, well developed in no acute distress. Sitting comfortably on the exam table  NECK: No JVD CARDIAC: RRR, no murmurs, rubs, gallops. Radial pulses 2+ bilaterally  RESPIRATORY:  Clear to auscultation without rales, wheezing or rhonchi. Normal work of breathing on room air  ABDOMEN: Soft, non-tender, non-distended EXTREMITIES:  No edema in BLE. Wearing compression stockings; No deformity   ASSESSMENT AND PLAN: .    Chronic Systolic Heart Failure  LBBB - EF was previously as low as 25-30% in 2011. Most recent echocardiogram from 01/2023 that showed EF 35-40%, global hypokinesis, normal RV function, normal PA systolic pressure - Currently on carvedilol 12.5 mg BID, entresto 97-103 mg BID, spironolactone  12.5 mg daily  - Followed by nephrology-- creatinine 2.5 in 06/2023. Discussed that with his kidney function, we might have to stop or decrease his doses of entresto and spironolactone. Patient saw his nephrologist, Dr. Kathrene Bongo, yesterday. She drew blood and a urine sample. She told patient and family that based on his lab results from  yesterday, he may need adjustments to his medication regiment  - Instructed patient to wait for results from his nephrologist and follow her recommendations regarding entresto and spironolactone. Asked that he let us know if medication changes are made  - Chronic LBBB- likely contributing to reduced EF   HTN  - BP well controlled today. Denies symptoms of hypotension  - As above, creatinine 2.57 when last checked in 06/2023. Saw nephrologist yesterday who drew labs. Awaiting lab results prior to making further medication adjustments   PAF  - Patient was first diagnosed with afib/flutter in 06/2019 - Denies significant palpitations or tachycardia. On exam, his heart rate and rhythm are regular  - Continue amiodarone 100 mg daily - LFTs, TSH stable as of 11/2022  - Continue carvedilol 12.5 mg BID - Continue eliquis 2.5 mg BID (dose based on age, kidney function). Denies bleeding on eliquis   CKD stage IV  - Lab work from 10/8 showed K 5.2, creatinine 2.57, eGFR 21.5 - Followed by nephrology - was seen yesterday. As above, had labs checked and his nephrologist may adjust some of his medications based on results. Patient knows to keep [place] updated about any medication adjustments   HLD  - Was previously on atorvastatin, stopped briefly to see if his right side pain improved. Pain did  not change, planning to resume atorvastatin after talking to PCP next week  - Lipid panel from 11/2022 showed LDL 66   Dispo: Follow up in 2 months with Dr. Tresa Endo or APP   Signed, Jonita Albee, PA-C

## 2023-08-15 ENCOUNTER — Ambulatory Visit: Payer: Medicare Other | Admitting: Cardiology

## 2023-08-15 ENCOUNTER — Ambulatory Visit: Payer: Medicare Other | Attending: Cardiology | Admitting: Cardiology

## 2023-08-15 ENCOUNTER — Encounter: Payer: Self-pay | Admitting: Cardiology

## 2023-08-15 VITALS — BP 132/62 | HR 68 | Ht 67.0 in | Wt 167.4 lb

## 2023-08-15 DIAGNOSIS — N184 Chronic kidney disease, stage 4 (severe): Secondary | ICD-10-CM

## 2023-08-15 DIAGNOSIS — I10 Essential (primary) hypertension: Secondary | ICD-10-CM

## 2023-08-15 DIAGNOSIS — E782 Mixed hyperlipidemia: Secondary | ICD-10-CM | POA: Diagnosis not present

## 2023-08-15 DIAGNOSIS — I5022 Chronic systolic (congestive) heart failure: Secondary | ICD-10-CM | POA: Diagnosis not present

## 2023-08-15 DIAGNOSIS — I480 Paroxysmal atrial fibrillation: Secondary | ICD-10-CM | POA: Diagnosis not present

## 2023-08-15 NOTE — Patient Instructions (Addendum)
Medication Instructions:  No changes *If you need a refill on your cardiac medications before your next appointment, please call your pharmacy*  Lab Work: No labs  Testing/Procedures: No testing  Follow-Up: At [facility], you and your health needs are our priority.  As part of our continuing mission to provide you with exceptional heart care, we have created designated Provider Care Teams.  These Care Teams include your primary Cardiologist (physician) and Advanced Practice Providers (APPs -  Physician Assistants and Nurse Practitioners) who all work together to provide you with the care you need, when you need it.  We recommend signing up for the patient portal called "MyChart".  Sign up information is provided on this After Visit Summary.  MyChart is used to connect with patients for Virtual Visits (Telemedicine).  Patients are able to view lab/test results, encounter notes, upcoming appointments, etc.  Non-urgent messages can be sent to your provider as well.   To learn more about what you can do with MyChart, go to [email or web address].    Your next appointment:   2 month(s)  Provider:   Nicki Guadalajara, MD or Robet Leu PA-C or any APP  Other Instructions Follow up with Dr. Kathrene Bongo on your Sherryll Burger , Spironolactone, Call our office regarding any medication changes that the doctor makes.

## 2023-08-19 ENCOUNTER — Encounter: Payer: Self-pay | Admitting: Family Medicine

## 2023-08-19 ENCOUNTER — Ambulatory Visit (INDEPENDENT_AMBULATORY_CARE_PROVIDER_SITE_OTHER): Payer: Medicare Other | Admitting: Family Medicine

## 2023-08-19 VITALS — BP 126/74 | HR 70 | Temp 98.5°F | Ht 67.0 in | Wt 165.4 lb

## 2023-08-19 DIAGNOSIS — E785 Hyperlipidemia, unspecified: Secondary | ICD-10-CM

## 2023-08-19 MED ORDER — ATORVASTATIN CALCIUM 80 MG PO TABS: ORAL_TABLET | ORAL | Status: DC

## 2023-08-19 NOTE — Patient Instructions (Signed)
Restart atorvastatin.  Take twice weekly if tolerated.  If no aches, then gradually increase by 1 pill per week.   If you have any aches then stop the med and please call the clinic to let me know so we can see about other options.   Take care.  Glad to see you.

## 2023-08-19 NOTE — Progress Notes (Unsigned)
Still on atorvastatin.  No recent tylenol.  Pain is gone, ie no pain.  No FCNAVD.  He prev used home exercises/stretches.  Leg strength is still good.

## 2023-08-20 NOTE — Assessment & Plan Note (Signed)
Unclear how much (if any) of his previous symptoms were related to statin use.  Discussed options.  He has an indication for statin use.  Will try to restart Lipitor twice weekly, not on consecutive days and then gradually increase the frequency of dosing to see if he can tolerate it.  If he has any return of muscle aches in the meantime then stop the medication and let me know.  See after visit summary.  He agrees with plan.

## 2023-08-21 ENCOUNTER — Other Ambulatory Visit: Payer: Self-pay | Admitting: Student

## 2023-09-13 ENCOUNTER — Other Ambulatory Visit: Payer: Self-pay | Admitting: Cardiovascular Disease

## 2023-09-15 DIAGNOSIS — N183 Chronic kidney disease, stage 3 unspecified: Secondary | ICD-10-CM | POA: Diagnosis not present

## 2023-10-18 NOTE — Progress Notes (Deleted)
 Cardiology Office Note:  .   Date:  10/18/2023  ID:  John Hines, DOB [birth year], MRN [id number] PCP: Joaquim Nam, MD  [facility] Providers Cardiologist:  Nicki Guadalajara, MD  History of Present Illness: .   John Hines is a 89 y.o. male with a past medical history of PAF, HTN, chronic systolic heart failure, hypothyroidism, CKD and solitary kidney, history of CVA. Patient is followed by Dr. Tresa Endo and presents today for a 6 month follow up appointment.    In 06/2010, EF was 25-30%. Repeat echo in 10/2010 showed EF 35-45%, grade II DD, LBBB related asynchrony. He was lost to cardiology follow up from 2015-2019. When he reestablished care with cardiology in 08/2018, echocardiogram showed EF 30-35%, diffuse hypokinesis, grade I DD. Diagnosed with atrial fib/flutter in 06/2019 and was seen by Dr. Elberta Fortis with EP. He was started amiodarone and eliquis at that time. Also treated with carvedilol.    Patient was last seen by cardiology on 01/10/23. At that time, patient was being followed by nephrology. He complained of lower extremity and right hand edema. Denied chest pain, shortness of breath, orthopnea, PND. Remained on spironolactone, entresto, carvedilol. He was not started on additional diuretic due to worsening kidney function. Underwent echocardiogram on 01/31/23 that showed EF 35-40%, global hypokinesis, normal diastolic parameters, normal RV function, normal pulmonary artery systolic pressure.   I saw patient in the clinic on 08/15/23. At that time, patient reported having pain on the right side of his body that extended from his shoulder to knee. Managed pain with tylenol, and his PCP had stopped his statin to see if symptoms improved. Patient was followed closely by nephrology.    Chronic Systolic Heart Failure  LBBB - EF was previously as low as 25-30% in 2011. Most recent echocardiogram from 01/2023 that showed EF 35-40%, global hypokinesis, normal RV function, normal PA  systolic pressure - Currently on carvedilol 12.5 mg BID, entresto 97-103 mg BID, spironolactone 12.5 mg daily  - Followed by nephrology *** - Chronic LBBB- likely contributing to reduced EF    HTN  - Current medications include amlodipine 5 mg daily, carvedilol 12.5 mg BID, entresto 97/103 mg BID, spironolactone 12.5 mg daily  -    PAF  - Patient was first diagnosed with afib/flutter in 06/2019 - Denies significant palpitations or tachycardia. On exam, his heart rate and rhythm are regular  - Continue amiodarone 100 mg daily  - Ordered LFTs and CXR for amiodarone monitoring  - Continue carvedilol 12.5 mg BID - Continue eliquis 2.5 mg BID (dose based on age, kidney function). Denies bleeding on eliquis    CKD stage IV  - Lab work from    HLD  - Was previously on atorvastatin, stopped briefly to see if his right side pain improved. Pain did  not change, planning to resume atorvastatin after talking to PCP next week  - Lipid panel from 11/2022 showed LDL 66   ROS: ***  Studies Reviewed: .        *** Risk Assessment/Calculations:   {Does this patient have ATRIAL FIBRILLATION?:[id number]} No BP recorded.  {Refresh Note OR Click here to enter BP  :1}***       Physical Exam:   VS:  There were no vitals taken for this visit.   Wt Readings from Last 3 Encounters:  08/19/23 165 lb 6.4 oz (75 kg)  08/15/23 167 lb 6.4 oz (75.9 kg)  07/18/23 168 lb 4 oz (76.3 kg)  GEN: Well nourished, well developed in no acute distress NECK: No JVD; No carotid bruits CARDIAC: ***RRR, no murmurs, rubs, gallops RESPIRATORY:  Clear to auscultation without rales, wheezing or rhonchi  ABDOMEN: Soft, non-tender, non-distended EXTREMITIES:  No edema; No deformity   ASSESSMENT AND PLAN: .   ***    {Are you ordering a CV Procedure (e.g. stress test, cath, DCCV, TEE, etc)?   Press F2        :[id number]}  Dispo: ***  Signed, Jonita Albee, PA-C

## 2023-10-24 ENCOUNTER — Ambulatory Visit: Payer: Medicare Other | Admitting: Cardiology

## 2023-10-30 NOTE — Progress Notes (Signed)
Cardiology Office Note:  .   Date:  11/07/2023  ID:  John Hines, DOB 1931-10-26, MRN [id number] PCP: Joaquim Nam, MD  [facility] Providers Cardiologist:  Nicki Guadalajara, MD  History of Present Illness: .   John Hines is a 89 y.o. male with a past medical history of PAF, HTN, chronic systolic heart failure, hypothyroidism, CKD and solitary kidney, history of CVA. Patient is followed by Dr. Tresa Endo and presents today for a 6 month follow up appointment.    In 06/2010, EF was 25-30%. Repeat echo in 10/2010 showed EF 35-45%, grade II DD, LBBB related asynchrony. He was lost to cardiology follow up from 2015-2019. When he reestablished care with cardiology in 08/2018, echocardiogram showed EF 30-35%, diffuse hypokinesis, grade I DD. Diagnosed with atrial fib/flutter in 06/2019 and was seen by Dr. Elberta Fortis with EP. He was started amiodarone and eliquis at that time. Also treated with carvedilol.    Patient was last seen by cardiology on 01/10/23. At that time, patient was being followed by nephrology. He complained of lower extremity and right hand edema. Denied chest pain, shortness of breath, orthopnea, PND. Remained on spironolactone, entresto, carvedilol. He was not started on additional diuretic due to worsening kidney function. Underwent echocardiogram on 01/31/23 that showed EF 35-40%, global hypokinesis, normal diastolic parameters, normal RV function, normal pulmonary artery systolic pressure.   I saw patient in the clinic on 08/15/23. At that time, patient reported having pain on the right side of his body that extended from his shoulder to knee. Managed pain with tylenol, and his PCP had stopped his statin to see if symptoms improved. Patient was followed closely by nephrology.   Today, patient reports for follow-up appointment accompanied by his daughter and his granddaughter.  He reports that he has been doing very well from a cardiac perspective.  Denies chest pain,  shortness of breath, dyspnea on exertion, orthopnea, ankle swelling.  Denies dizziness, syncope, near syncope, palpitations.  He is followed by nephrology for his CKD.  In January, he had been told to take potassium supplementation for a few days in a row due to low potassium, his potassium levels have not been checked since.  He is no longer taking potassium supplementation.  He continues to be on Eliquis, denies bleeding or significant bruising on Eliquis.  Denies recent hospitalizations or viral illnesses. He is taking his lipitor again and has been tolerating it well    ROS: Denies chest pain, shortness of breath, syncope, near syncope, palpitations, lower extremity swelling  Studies Reviewed: .   Cardiac Studies & Procedures   ______________________________________________________________________________________________   STRESS TESTS  NM MYOCAR MULTI W/SPECT W 08/30/2010   ECHOCARDIOGRAM  ECHOCARDIOGRAM COMPLETE 01/31/2023  Narrative ECHOCARDIOGRAM REPORT    Patient Name:   John Hines Date of Exam: 01/31/2023 Medical Rec #:  [id number]        Height:       67.0 in Accession #:    [id number]       Weight:       172.0 lb Date of Birth:  March 28, 1932        BSA:          1.897 m Patient Age:    89 years         BP:           134/52 mmHg Patient Gender: M                HR:  67 bpm. Exam Location:  [address]  Procedure: 2D Echo, Cardiac Doppler, Color Doppler and 3D Echo  Indications:    Chronic systolic heart Failure I50.22  History:        Patient has prior history of Echocardiogram examinations, most recent 08/31/2018. CKD; Risk Factors:Hypertension and Dyslipidemia.  Sonographer:    Thurman Coyer RDCS Referring Phys: [id number] [facility] [place]  IMPRESSIONS   1. Abnormal septal motion. Left ventricular ejection fraction, by estimation, is 35 to 40%. The left ventricle has moderately decreased function. The left ventricle demonstrates global  hypokinesis. The left ventricular internal cavity size was moderately dilated. Left ventricular diastolic parameters were normal. 2. Right ventricular systolic function is normal. The right ventricular size is normal. There is normal pulmonary artery systolic pressure. 3. Left atrial size was moderately dilated. 4. The mitral valve is abnormal. Trivial mitral valve regurgitation. No evidence of mitral stenosis. 5. The aortic valve is tricuspid. There is mild calcification of the aortic valve. There is mild thickening of the aortic valve. Aortic valve regurgitation is not visualized. Aortic valve sclerosis/calcification is present, without any evidence of aortic stenosis. 6. The inferior vena cava is normal in size with greater than 50% respiratory variability, suggesting right atrial pressure of 3 mmHg.  FINDINGS Left Ventricle: Abnormal septal motion. Left ventricular ejection fraction, by estimation, is 35 to 40%. The left ventricle has moderately decreased function. The left ventricle demonstrates global hypokinesis. The left ventricular internal cavity size was moderately dilated. There is no left ventricular hypertrophy. Left ventricular diastolic parameters were normal.  Right Ventricle: The right ventricular size is normal. No increase in right ventricular wall thickness. Right ventricular systolic function is normal. There is normal pulmonary artery systolic pressure. The tricuspid regurgitant velocity is 2.35 m/s, and with an assumed right atrial pressure of 3 mmHg, the estimated right ventricular systolic pressure is 25.1 mmHg.  Left Atrium: Left atrial size was moderately dilated.  Right Atrium: Right atrial size was normal in size.  Pericardium: There is no evidence of pericardial effusion.  Mitral Valve: The mitral valve is abnormal. There is mild thickening of the mitral valve leaflet(s). Trivial mitral valve regurgitation. No evidence of mitral valve stenosis.  Tricuspid Valve:  The tricuspid valve is normal in structure. Tricuspid valve regurgitation is trivial. No evidence of tricuspid stenosis.  Aortic Valve: The aortic valve is tricuspid. There is mild calcification of the aortic valve. There is mild thickening of the aortic valve. Aortic valve regurgitation is not visualized. Aortic valve sclerosis/calcification is present, without any evidence of aortic stenosis.  Pulmonic Valve: The pulmonic valve was normal in structure. Pulmonic valve regurgitation is not visualized. No evidence of pulmonic stenosis.  Aorta: The aortic root is normal in size and structure.  Venous: The inferior vena cava is normal in size with greater than 50% respiratory variability, suggesting right atrial pressure of 3 mmHg.  IAS/Shunts: No atrial level shunt detected by color flow Doppler.   LEFT VENTRICLE PLAX 2D LVIDd:         5.60 cm   Diastology LVIDs:         4.30 cm   LV e' medial:    5.27 cm/s LV PW:         1.10 cm   LV E/e' medial:  12.3 LV IVS:        1.10 cm   LV e' lateral:   5.68 cm/s LVOT diam:     2.30 cm   LV E/e' lateral: 11.4 LV SV:  71 LV SV Index:   38 LVOT Area:     4.15 cm  3D Volume EF: 3D EF:        35 % LV EDV:       174 ml LV ESV:       114 ml LV SV:        61 ml  RIGHT VENTRICLE RV Basal diam:  3.30 cm RV Mid diam:    2.90 cm RV S prime:     5.35 cm/s TAPSE (M-mode): 1.6 cm  LEFT ATRIUM             Index        RIGHT ATRIUM           Index LA diam:        4.30 cm 2.27 cm/m   RA Area:     18.60 cm LA Vol (A2C):   74.9 ml 39.49 ml/m  RA Volume:   48.10 ml  25.36 ml/m LA Vol (A4C):   49.3 ml 25.99 ml/m LA Biplane Vol: 63.0 ml 33.22 ml/m AORTIC VALVE LVOT Vmax:   70.30 cm/s LVOT Vmean:  46.300 cm/s LVOT VTI:    0.172 m  AORTA Ao Root diam: 3.40 cm Ao Asc diam:  3.60 cm  MITRAL VALVE               TRICUSPID VALVE MV Area (PHT): 3.72 cm    TR Peak grad:   22.1 mmHg MV Decel Time: 204 msec    TR Vmax:        235.00 cm/s MV  E velocity: 64.70 cm/s MV A velocity: 88.30 cm/s  SHUNTS MV E/A ratio:  0.73        Systemic VTI:  0.17 m Systemic Diam: 2.30 cm  Charlton Haws MD Electronically signed by Charlton Haws MD Signature Date/Time: 01/31/2023/8:38:59 AM    Final          ______________________________________________________________________________________________      Risk Assessment/Calculations:    CHA2DS2-VASc Score = 6   This indicates a 9.7% annual risk of stroke. The patient's score is based upon: CHF History: 1 HTN History: 1 Diabetes History: 0 Stroke History: 2 Vascular Disease History: 0 Age Score: 2 Gender Score: 0          Physical Exam:   VS:  BP 132/70   Pulse (!) 56   Ht 5\' 7"  (1.702 m)   Wt 171 lb (77.6 kg)   SpO2 97%   BMI 26.78 kg/m    Wt Readings from Last 3 Encounters:  11/07/23 171 lb (77.6 kg)  08/19/23 165 lb 6.4 oz (75 kg)  08/15/23 167 lb 6.4 oz (75.9 kg)    GEN: Well nourished, well developed in no acute distress. Sitting comfortably on the exam table  NECK: No JVD CARDIAC: RRR, no murmurs, rubs, gallops. Radial pulses 2+ bilaterally  RESPIRATORY:  Clear to auscultation without rales, wheezing or rhonchi. Normal WOB on room air   ABDOMEN: Soft, non-tender, non-distended EXTREMITIES:  No edema in BLE; No deformity   ASSESSMENT AND PLAN: .    Chronic Systolic Heart Failure  LBBB - EF was previously as low as 25-30% in 2011. Most recent echocardiogram from 01/2023 that showed EF 35-40%, global hypokinesis, normal RV function, normal PA systolic pressure - Patient denies shortness of breath, lower extremity edema, orthopnea, chest pain. Euvolemic on exam today  - Continue carvedilol 12.5 mg BID, entresto 97-103 mg BID, spironolactone 12.5 mg daily  - Also  followed by nephrology  - Chronic LBBB- likely contributing to reduced EF    HTN  - BP is well controlled on current medications. Denies symptoms of orthostatic hypotension  - Continue amlodipine 5 mg  daily, carvedilol 12.5 mg BID, entresto 97/103 mg BID, spironolactone 12.5 mg daily  - Ordered CMP for medication monitoring    PAF  - Patient was first diagnosed with afib/flutter in 06/2019 - Denies significant palpitations or tachycardia. On exam, his heart rate and rhythm are regular. EKG showed sinus bradycardia with 1 PAC  - Continue amiodarone 100 mg daily  - Ordered CMP and TSH/free T4 for amiodarone monitoring.  - Continue carvedilol 12.5 mg BID - Continue eliquis 2.5 mg BID (dose based on age, kidney function). Denies bleeding on eliquis. Ordered CBC for monitoring    CKD stage IV  Hypokalemia  - Lab work from 08/2023 showed creatinine 2.17  - Patient reports his nephrologist had him take a potassium supplementation for a few days in January, but his potassium levels were not rechecked. Ordered CMP today    HLD  - Patient is on lipitor 80 mg daily. Earlier this year, he had taken a statin holiday. Now back on statins and tolerating them well. Denies myalgias  - Lipid panel from 11/2022 showed LDL 66 - Ordered repeat lipid panel, CMP  Dispo: Follow up in 6 months to establish care with a new cardiologist (as Dr. Tresa Endo is nearing retirement)   Signed, Jonita Albee, PA-C

## 2023-11-07 ENCOUNTER — Ambulatory Visit: Payer: Medicare Other | Attending: Cardiology | Admitting: Cardiology

## 2023-11-07 ENCOUNTER — Encounter: Payer: Self-pay | Admitting: Cardiology

## 2023-11-07 VITALS — BP 132/70 | HR 56 | Ht 67.0 in | Wt 171.0 lb

## 2023-11-07 DIAGNOSIS — I480 Paroxysmal atrial fibrillation: Secondary | ICD-10-CM | POA: Diagnosis not present

## 2023-11-07 DIAGNOSIS — I10 Essential (primary) hypertension: Secondary | ICD-10-CM

## 2023-11-07 DIAGNOSIS — N184 Chronic kidney disease, stage 4 (severe): Secondary | ICD-10-CM

## 2023-11-07 DIAGNOSIS — E782 Mixed hyperlipidemia: Secondary | ICD-10-CM

## 2023-11-07 DIAGNOSIS — I5022 Chronic systolic (congestive) heart failure: Secondary | ICD-10-CM

## 2023-11-07 NOTE — Patient Instructions (Addendum)
Medication Instructions:  No changes *If you need a refill on your cardiac medications before your next appointment, please call your pharmacy*  Lab Work: Today we are going to draw a CMP, TSH, Free t4, and CBC If you have labs (blood work) drawn today and your tests are completely normal, you will receive your results only by: MyChart Message (if you have MyChart) OR A paper copy in the mail If you have any lab test that is abnormal or we need to change your treatment, we will call you to review the results.=  Testing/Procedures: No testing  Follow-Up: At [facility], you and your health needs are our priority.  As part of our continuing mission to provide you with exceptional heart care, we have created designated Provider Care Teams.  These Care Teams include your primary Cardiologist (physician) and Advanced Practice Providers (APPs -  Physician Assistants and Nurse Practitioners) who all work together to provide you with the care you need, when you need it.  We recommend signing up for the patient portal called "MyChart".  Sign up information is provided on this After Visit Summary.  MyChart is used to connect with patients for Virtual Visits (Telemedicine).  Patients are able to view lab/test results, encounter notes, upcoming appointments, etc.  Non-urgent messages can be sent to your provider as well.   To learn more about what you can do with MyChart, go to [email or web address].    Your next appointment:   6 month(s)  Provider:   Dr. Scharlene Gloss, Dr Jerene Pitch, Dr. Jacques Navy, Dr. Izora Ribas

## 2023-11-08 LAB — COMPREHENSIVE METABOLIC PANEL
ALT: 8 [IU]/L (ref 0–44)
AST: 11 [IU]/L (ref 0–40)
Albumin: 4.4 g/dL (ref 3.7–4.7)
Alkaline Phosphatase: 121 [IU]/L (ref 44–121)
BUN/Creatinine Ratio: 15 (ref 10–24)
BUN: 30 mg/dL — ABNORMAL HIGH (ref 8–27)
Bilirubin Total: 0.5 mg/dL (ref 0.0–1.2)
CO2: 21 mmol/L (ref 20–29)
Calcium: 9.1 mg/dL (ref 8.6–10.2)
Chloride: 107 mmol/L — ABNORMAL HIGH (ref 96–106)
Creatinine, Ser: 2 mg/dL — ABNORMAL HIGH (ref 0.76–1.27)
Globulin, Total: 2.2 g/dL (ref 1.5–4.5)
Glucose: 104 mg/dL — ABNORMAL HIGH (ref 70–99)
Potassium: 4.7 mmol/L (ref 3.5–5.2)
Sodium: 141 mmol/L (ref 134–144)
Total Protein: 6.6 g/dL (ref 6.0–8.5)
eGFR: 31 mL/min/{1.73_m2} — ABNORMAL LOW (ref >59)

## 2023-11-08 LAB — CBC
Hematocrit: 31.4 % — ABNORMAL LOW (ref 37.5–51.0)
Hemoglobin: 10 g/dL — ABNORMAL LOW (ref 13.0–17.7)
MCH: 26 pg — ABNORMAL LOW (ref 26.6–33.0)
MCHC: 31.8 g/dL (ref 31.5–35.7)
MCV: 82 fL (ref 79–97)
Platelets: 200 10*3/uL (ref 150–450)
RBC: 3.84 x10E6/uL — ABNORMAL LOW (ref 4.14–5.80)
RDW: 14.9 % (ref 11.6–15.4)
WBC: 4.2 10*3/uL (ref 3.4–10.8)

## 2023-11-08 LAB — TSH: TSH: 4.17 u[IU]/mL (ref 0.450–4.500)

## 2023-11-08 LAB — T4, FREE: Free T4: 1.7 ng/dL (ref 0.82–1.77)

## 2023-11-11 ENCOUNTER — Telehealth: Payer: Self-pay

## 2023-11-11 NOTE — Telephone Encounter (Signed)
-----   Message from John Hines sent at 11/09/2023  3:18 PM EST ----- Please tell patient that his lab work showed stable kidney function- creatinine is 2.00, which is actually the best it has been in our system for the past 10 months. Electrolytes normal. Liver function normal. Hemoglobin is a bit low, but is stable. Thyroid function normal.   No changes to medications   Thanks  KJ

## 2023-11-11 NOTE — Telephone Encounter (Signed)
 Called patient advised of below they verbalized understanding.

## 2023-12-05 ENCOUNTER — Other Ambulatory Visit: Payer: Self-pay | Admitting: Family Medicine

## 2023-12-31 ENCOUNTER — Ambulatory Visit (INDEPENDENT_AMBULATORY_CARE_PROVIDER_SITE_OTHER): Payer: Medicare Other

## 2023-12-31 VITALS — Ht 67.0 in | Wt 171.0 lb

## 2023-12-31 DIAGNOSIS — Z0000 Encounter for general adult medical examination without abnormal findings: Secondary | ICD-10-CM

## 2023-12-31 NOTE — Patient Instructions (Signed)
 Mr. Semper , Thank you for taking time to come for your Medicare Wellness Visit. I appreciate your ongoing commitment to your health goals. Please review the following plan we discussed and let me know if I can assist you in the future.   Referrals/Orders/Follow-Ups/Clinician Recommendations: none  This is a list of the screening recommended for you and due dates:  Health Maintenance  Topic Date Due   Zoster (Shingles) Vaccine (1 of 2) Never done   DTaP/Tdap/Td vaccine (2 - Tdap) 07/17/2024*   Flu Shot  04/23/2024   Medicare Annual Wellness Visit  12/30/2024   Pneumonia Vaccine  Completed   HPV Vaccine  Aged Out   COVID-19 Vaccine  Discontinued  *Topic was postponed. The date shown is not the original due date.    Advanced directives: (Declined) Advance directive discussed with you today. Even though you declined this today, please call our office should you change your mind, and we can give you the proper paperwork for you to fill out.  Next Medicare Annual Wellness Visit scheduled for next year: Yes 01/03/25 @ 10:50am telephone

## 2023-12-31 NOTE — Progress Notes (Signed)
 Please attest and cosign this visit due to patients primary care provider not being in the office at the time the visit was completed.    Subjective:   John Hines is a 89 y.o. who presents for a Medicare Wellness preventive visit.  Visit Complete: Virtual I connected with  John Hines on 12/31/23 by a audio enabled telemedicine application and verified that I am speaking with the correct person using two identifiers.  Patient Location: Home  Provider Location: Home Office  I discussed the limitations of evaluation and management by telemedicine. The patient expressed understanding and agreed to proceed.  Vital Signs: Because this visit was a virtual/telehealth visit, some criteria may be missing or patient reported. Any vitals not documented were not able to be obtained and vitals that have been documented are patient reported.  VideoDeclined- This patient declined Librarian, academic. Therefore the visit was completed with audio only.  Persons Participating in Visit: Patient.  AWV Questionnaire: No: Patient Medicare AWV questionnaire was not completed prior to this visit.  Cardiac Risk Factors include: advanced age (>58men, >46 women);dyslipidemia;hypertension;male gender     Objective:    Today's Vitals   12/31/23 1521  Weight: 171 lb (77.6 kg)  Height: 5\' 7"  (1.702 m)   Body mass index is 26.78 kg/m.     12/31/2023    3:33 PM 12/30/2022   10:49 AM 03/30/2021    4:54 PM 05/02/2020    2:06 PM 07/11/2019    3:11 PM 06/27/2019    3:00 PM 06/25/2019    7:59 PM  Advanced Directives  Does Patient Have a Medical Advance Directive? No No No No No No No  Does patient want to make changes to medical advance directive?  Yes (MAU/Ambulatory/Procedural Areas - Information given)       Would patient like information on creating a medical advance directive?   No - Patient declined No - Patient declined No - Patient declined No - Patient declined      Current Medications (verified) Outpatient Encounter Medications as of 12/31/2023  Medication Sig   acetaminophen (TYLENOL) 500 MG tablet Take 1-2 tablets (500-1,000 mg total) by mouth every 8 (eight) hours as needed (for pain).   amiodarone (PACERONE) 200 MG tablet TAKE ONE-HALF TABLET BY MOUTH  DAILY   amLODipine (NORVASC) 5 MG tablet TAKE 1 TABLET BY MOUTH DAILY   atorvastatin (LIPITOR) 80 MG tablet Take twice weekly if tolerated.  If no aches, then gradually increase by 1 pill per week.   carvedilol (COREG) 12.5 MG tablet TAKE 1 TABLET BY MOUTH TWICE  DAILY   ELIQUIS 2.5 MG TABS tablet TAKE 1 TABLET BY MOUTH TWICE  DAILY   ENTRESTO 97-103 MG TAKE 1 TABLET BY MOUTH TWICE  DAILY   ferrous sulfate 325 (65 FE) MG tablet Take 1 tablet (325 mg total) by mouth daily.   levothyroxine (SYNTHROID) 75 MCG tablet TAKE 1 TABLET BY MOUTH DAILY BEFORE BREAKFAST.   sildenafil (REVATIO) 20 MG tablet Take 1-5 tablets (20-100 mg total) by mouth daily as needed (Do not take with nitroglycerin.  Start with 1 tablet daily as needed.).   sodium zirconium cyclosilicate (LOKELMA) 10 g PACK packet Take 10 g by mouth. Take every other day until next appt in December   spironolactone (ALDACTONE) 25 MG tablet TAKE ONE-HALF TABLET BY MOUTH  DAILY   No facility-administered encounter medications on file as of 12/31/2023.    Allergies (verified) Lisinopril, Molnupiravir, Nsaids, and Penicillins  History: Past Medical History:  Diagnosis Date   Arthritis    BPH (benign prostatic hyperplasia)    CHF (congestive heart failure) (HCC)    Nonischemic Cardiomyopathy. EF 25-35%   Chronic kidney disease 05/20/2013   CKD 3   Colon polyps    Dyslipidemia    Dysrhythmia 08/19/11   "low heart beat; maybe 25bpm; got RX; now 60bpm"   ED (erectile dysfunction)    Gout    Heart murmur    Hyperlipidemia    Hypertension    Low back pain 08/12/2013   Nonfunctioning kidney Recognized 03/2006   Chronically and Severely  Hydronephrotic and Nonfunctioning Right Kidney   Solitary kidney Recognized 03/2006   Solitary Functioning Left Kidney   Stroke [facility])    "mild stroke; dx'd 2010; don't know when it happened"   Syncope and collapse 08/19/11   "shallow breathing; sweating horribly"   Past Surgical History:  Procedure Laterality Date   CARDIOVASCULAR STRESS TEST  08/30/2010   Small to moderate fixed inferoseptal defect. Severe global hypokinesis with inferior akinesis and septal dyskinesis-likely secondary to a prominent LBBB. Non-diagnostic for ischemia due to persantine.   KNEE ARTHROSCOPY  1996   right   TRANSTHORACIC ECHOCARDIOGRAM  08/20/2011   EF 45-50%, severe concentric hyperthrophy.   Family History  Problem Relation Age of Onset   Early death Mother        died in child birth   Stomach cancer Father        stomach   Social History   Socioeconomic History   Marital status: Widowed    Spouse name: Not on file   Number of children: Not on file   Years of education: Not on file   Highest education level: Not on file  Occupational History   Not on file  Tobacco Use   Smoking status: Never   Smokeless tobacco: Never  Vaping Use   Vaping status: Never Used  Substance and Sexual Activity   Alcohol use: Yes    Comment: occasional   Drug use: No   Sexual activity: Yes  Other Topics Concern   Not on file  Social History Narrative   Grew up in [place].  His mother died in childbirth when he was an infant.   Widowed 2019.  Was previously married for 57 years.   He works Advertising copywriter for [employer].   Social Drivers of Corporate investment banker Strain: Low Risk  (12/31/2023)   Overall Financial Resource Strain (CARDIA)    Difficulty of Paying Living Expenses: Not hard at all  Food Insecurity: No Food Insecurity (12/31/2023)   Hunger Vital Sign    Worried About Running Out of Food in the Last Year: Never true    Ran Out of Food in the Last Year: Never true   Transportation Needs: No Transportation Needs (12/31/2023)   PRAPARE - Administrator, Civil Service (Medical): No    Lack of Transportation (Non-Medical): No  Physical Activity: Sufficiently Active (12/31/2023)   Exercise Vital Sign    Days of Exercise per Week: 5 days    Minutes of Exercise per Session: 30 min  Stress: No Stress Concern Present (12/31/2023)   [employer] of Occupational Health - Occupational Stress Questionnaire    Feeling of Stress : Not at all  Social Connections: Socially Isolated (12/31/2023)   Social Connection and Isolation Panel [NHANES]    Frequency of Communication with Friends and Family: More than three times a  week    Frequency of Social Gatherings with Friends and Family: More than three times a week    Attends Religious Services: Never    Database administrator or Organizations: No    Attends Banker Meetings: Never    Marital Status: Widowed    Tobacco Counseling Counseling given: Not Answered    Clinical Intake:  Pre-visit preparation completed: No  Pain : No/denies pain     BMI - recorded: 26.78 Nutritional Status: BMI 25 -29 Overweight Nutritional Risks: None Diabetes: No  Lab Results  Component Value Date   HGBA1C 6.8 (H) 02/11/2018   HGBA1C 6.8 (H) 08/07/2017   HGBA1C 6.7 (H) 02/06/2017     How often do you need to have someone help you when you read instructions, pamphlets, or other written materials from your doctor or pharmacy?: 1 - Never  Interpreter Needed?: No  Comments: son lives with pt Information entered by :: B.Arhum Peeples,LPN   Activities of Daily Living     12/31/2023    3:35 PM  In your present state of health, do you have any difficulty performing the following activities:  Hearing? 0  Vision? 0  Difficulty concentrating or making decisions? 0  Walking or climbing stairs? 0  Dressing or bathing? 0  Doing errands, shopping? 0  Preparing Food and eating ? N  Using the Toilet? N   In the past six months, have you accidently leaked urine? N  Do you have problems with loss of bowel control? N  Managing your Medications? N  Managing your Finances? N  Housekeeping or managing your Housekeeping? N    Patient Care Team: Joaquim Nam, MD as PCP - General (Family Medicine) Lennette Bihari, MD as PCP - Cardiology (Cardiology) Annie Sable, MD as Consulting Physician (Nephrology) Jonita Albee, PA-C as Physician Assistant (Cardiology)  Indicate any recent Medical Services you may have received from other than [facility] providers in the past year (date may be approximate).     Assessment:   This is a routine wellness examination for [facility].  Hearing/Vision screen Hearing Screening - Comments:: Pt says his hearing is good Vision Screening - Comments:: Pt says vision is good   Goals Addressed             This Visit's Progress    Blood Pressure < 130/80       12/31/23-will continue     COMPLETED: Patient Stated       12/31/23, I will maintain and continue medications as prescribed.      Remain active and independent   On track    12/31/23-       Depression Screen     12/31/2023    3:27 PM 08/19/2023   11:26 AM 07/01/2023    9:27 AM 01/10/2023    2:12 PM 12/30/2022   10:47 AM 11/18/2022    4:16 PM 08/27/2021   11:41 AM  PHQ 2/9 Scores  PHQ - 2 Score 0 3 0 1 0 0 0  PHQ- 9 Score  3 0 3 0 1     Fall Risk     12/31/2023    3:23 PM 08/19/2023   11:26 AM 07/01/2023    9:27 AM 01/10/2023    2:12 PM 12/30/2022   10:58 AM  Fall Risk   Falls in the past year? 0 0 0 0 0  Number falls in past yr: 0 0 0 0 0  Injury with Fall? 0 0 0 0  0  Risk for fall due to : No Fall Risks No Fall Risks No Fall Risks No Fall Risks No Fall Risks  Follow up Education provided;Falls prevention discussed Falls evaluation completed Falls evaluation completed Falls evaluation completed Falls prevention discussed;Education provided;Falls evaluation completed    MEDICARE RISK AT  HOME:  Medicare Risk at Home Any stairs in or around the home?: Yes If so, are there any without handrails?: Yes Home free of loose throw rugs in walkways, pet beds, electrical cords, etc?: Yes Adequate lighting in your home to reduce risk of falls?: Yes Life alert?: No Use of a cane, walker or w/c?: No Grab bars in the bathroom?: No Shower chair or bench in shower?: No Elevated toilet seat or a handicapped toilet?: Yes  TIMED UP AND GO:  Was the test performed?  No  Cognitive Function: 6CIT completed    05/02/2020    2:10 PM  MMSE - Mini Mental State Exam  Orientation to time 5  Orientation to Place 5  Registration 3  Attention/ Calculation 5  Recall 3  Language- repeat 1        12/31/2023    3:35 PM 12/30/2022   11:02 AM  6CIT Screen  What Year? 0 points 0 points  What month? 0 points 0 points  What time? 0 points 0 points  Count back from 20 0 points 0 points  Months in reverse 4 points 0 points  Repeat phrase 2 points 0 points  Total Score 6 points 0 points    Immunizations Immunization History  Administered Date(s) Administered   Fluad Quad(high Dose 65+) 06/20/2020, 07/27/2021, 09/12/2022   Influenza,inj,Quad PF,6+ Mos 09/22/2013, 06/16/2014, 08/07/2017, 07/06/2019   Influenza-Unspecified 08/23/2015   PFIZER(Purple Top)SARS-COV-2 Vaccination 10/25/2019, 11/15/2019, 08/14/2020   Pfizer Covid-19 Vaccine Bivalent Booster 47yrs & up 08/21/2021   Pneumococcal Conjugate-13 09/22/2013   Pneumococcal Polysaccharide-23 11/22/2011   Td 09/23/2005    Screening Tests Health Maintenance  Topic Date Due   Zoster Vaccines- Shingrix (1 of 2) Never done   DTaP/Tdap/Td (2 - Tdap) 07/17/2024 (Originally 09/24/2015)   INFLUENZA VACCINE  04/23/2024   Medicare Annual Wellness (AWV)  12/30/2024   Pneumonia Vaccine 59+ Years old  Completed   HPV VACCINES  Aged Out   COVID-19 Vaccine  Discontinued    Health Maintenance  Health Maintenance Due  Topic Date Due   Zoster  Vaccines- Shingrix (1 of 2) Never done   Health Maintenance Items Addressed: None needed  Additional Screening:  Vision Screening: Recommended annual ophthalmology exams for early detection of glaucoma and other disorders of the eye.  Dental Screening: Recommended annual dental exams for proper oral hygiene  Community Resource Referral / Chronic Care Management: CRR required this visit?  No   CCM required this visit?  Appt scheduled with PCP     Plan:     I have personally reviewed and noted the following in the patient's chart:   Medical and social history Use of alcohol, tobacco or illicit drugs  Current medications and supplements including opioid prescriptions. Patient is not currently taking opioid prescriptions. Functional ability and status Nutritional status Physical activity Advanced directives List of other physicians Hospitalizations, surgeries, and ER visits in previous 12 months Vitals Screenings to include cognitive, depression, and falls Referrals and appointments  In addition, I have reviewed and discussed with patient certain preventive protocols, quality metrics, and best practice recommendations. A written personalized care plan for preventive services as well as general preventive health recommendations were provided  to patient.     Sue Lush, LPN   09/28/1094   After Visit Summary: (Declined) Due to this being a telephonic visit, with patients personalized plan was offered to patient but patient Declined AVS at this time   Notes: Nothing significant to report at this time.

## 2024-01-01 ENCOUNTER — Telehealth: Payer: Self-pay | Admitting: Family Medicine

## 2024-01-01 NOTE — Telephone Encounter (Signed)
 Returned call to patients daughter who is on [place]. The patient did end up have has medicare televisit appt on 4/9.

## 2024-01-01 NOTE — Telephone Encounter (Signed)
 Copied from CRM [id number]. Topic: General - Other >> Dec 31, 2023 12:25 PM Aletta Edouard wrote: Reason for CRM: patient daughter called in and stated nobody called patient daughter would like the call to come to her [phone]

## 2024-01-16 ENCOUNTER — Other Ambulatory Visit: Payer: Self-pay | Admitting: Cardiovascular Disease

## 2024-01-16 ENCOUNTER — Other Ambulatory Visit: Payer: Self-pay | Admitting: Physician Assistant

## 2024-01-23 ENCOUNTER — Other Ambulatory Visit: Payer: Self-pay | Admitting: Cardiovascular Disease

## 2024-01-28 ENCOUNTER — Telehealth: Payer: Self-pay

## 2024-01-28 NOTE — Telephone Encounter (Signed)
 Copied from CRM [id number]. Topic: Clinical - Medical Advice >> Jan 27, 2024  3:29 PM Ovid Blow wrote: Reason for CRM: Patient's daughter Oria Simon wants Dr. Vallarie Gauze or Nurse to give her a call

## 2024-01-29 NOTE — Telephone Encounter (Signed)
 Patient daughter notified of all options. She states she is just nervous especially due to her dads age.

## 2024-01-29 NOTE — Telephone Encounter (Signed)
 Returned call to patients daughter who is on the [place]. Patients is concerned about numerous things. For starters her father is getting married. She is wondering will she still be over his medical care. I did advise that she is still listed on his DPR as to be able to have access. But if he changes that, its out of our control and something she will have to take up with the courts.  In addition his soon to be wife has guardianship of an 88 year old that she is concerned with which makes her question her concern for her dads safety. She says the 88 year old use to be outgoing and now she is not. She wants to know if its possible that the 43 ear old is being over medicated. I do know the 11 year is out of our control since she is not a patient.I just wanted to make sure that its out for me to tell her that if she is concern she may have to discuss social service.

## 2024-01-29 NOTE — Telephone Encounter (Signed)
 Mr. John Hines gets to pick who his heathcare power of attorney is.  The same is true re: DPR here at clinic.  It would be reasonable for Mr. John Hines and his daughter to discuss that.  We can clarify that at an OV if needed.    If she is concerned for the safety of Mr. John Hines, then I encourage her to talk to him about that.    I don't have any comment about the status of the child in question, other than to say that if there is a question re: safety of a child then she could address that with the guardian.

## 2024-02-03 ENCOUNTER — Ambulatory Visit (INDEPENDENT_AMBULATORY_CARE_PROVIDER_SITE_OTHER): Admitting: Family Medicine

## 2024-02-03 ENCOUNTER — Telehealth: Payer: Self-pay

## 2024-02-03 ENCOUNTER — Encounter: Payer: Self-pay | Admitting: Family Medicine

## 2024-02-03 VITALS — BP 128/64 | HR 60 | Temp 98.4°F | Ht 66.54 in | Wt 166.2 lb

## 2024-02-03 DIAGNOSIS — N529 Male erectile dysfunction, unspecified: Secondary | ICD-10-CM | POA: Diagnosis not present

## 2024-02-03 DIAGNOSIS — I4892 Unspecified atrial flutter: Secondary | ICD-10-CM

## 2024-02-03 DIAGNOSIS — M1000 Idiopathic gout, unspecified site: Secondary | ICD-10-CM

## 2024-02-03 DIAGNOSIS — Z0000 Encounter for general adult medical examination without abnormal findings: Secondary | ICD-10-CM

## 2024-02-03 DIAGNOSIS — Z7189 Other specified counseling: Secondary | ICD-10-CM

## 2024-02-03 DIAGNOSIS — I10 Essential (primary) hypertension: Secondary | ICD-10-CM

## 2024-02-03 DIAGNOSIS — E039 Hypothyroidism, unspecified: Secondary | ICD-10-CM

## 2024-02-03 DIAGNOSIS — D509 Iron deficiency anemia, unspecified: Secondary | ICD-10-CM | POA: Diagnosis not present

## 2024-02-03 DIAGNOSIS — N183 Chronic kidney disease, stage 3 unspecified: Secondary | ICD-10-CM | POA: Diagnosis not present

## 2024-02-03 MED ORDER — LEVOTHYROXINE SODIUM 75 MCG PO TABS: 75.0000 ug | ORAL_TABLET | Freq: Every day | ORAL | 3 refills | Status: DC

## 2024-02-03 MED ORDER — SILDENAFIL CITRATE 20 MG PO TABS: 20.0000 mg | ORAL_TABLET | Freq: Every day | ORAL | 12 refills | Status: DC | PRN

## 2024-02-03 NOTE — Patient Instructions (Addendum)
 Please ask the kidney clinic to send me a copy of your labs and notes.  Take care.  Glad to see you.  Ask about seeing Dr. Renna Cary with cardiology.    Tetanus shot when possible.  RSV vaccine- ask the pharmacy about this.

## 2024-02-03 NOTE — Telephone Encounter (Signed)
 Pharmacy Patient Advocate Encounter   Received notification from CoverMyMeds that prior authorization for Sildenafil  Citrate (PAH) 20MG  tablets is required/requested.   Insurance verification completed.   The patient is insured through [facility] .   Per test claim: PA required; PA submitted to above mentioned insurance via CoverMyMeds Key/confirmation #/EOC [id number] Status is pending

## 2024-02-03 NOTE — Progress Notes (Unsigned)
 He is getting married on 02/28/2024.  Congratulations given to patient.    History of anemia.  Deferred labs today since he is going to have blood drawn likely tomorrow at renal clinic.  Most recent hemoglobin still slightly low but stable.  He has been working in his garden, per usual.  He is active with that.  He enjoys it.  Discussed.    CKD.  Per patient he is off lokelma.  Has renal clinic f/u pending.  He is going to have blood drawn tomorrow at that appointment.  He avoids nsaids.  Discussed deferring labs today given his pending appointment tomorrow.  History of ED.  Has used sildenafil  without complication.  No nitroglycerin use.  Hypertension:    Using medication without problems or lightheadedness: yes Chest pain with exertion: no Edema:no Short of breath: no  Dr. Loetta Ringer is retiring and we talked about asking to see Dr. Renna Cary with cardiology.    No recent gout flares.    Flu previously done Shingles discussed with patient PNA up-to-date Tetanus 2007. COVID-vaccine previously done RSV d/w pt.   Colon cancer screening deferred given his age.  He agrees. Prostate cancer screening deferred given his age.  He agrees to Best boy designated if patient were incapacitated.  Compliant with levothyroxine .  Previous TSH normal.  No neck mass.  Meds, vitals, and allergies reviewed.   PMH and SH reviewed  ROS: Per HPI unless specifically indicated in ROS section   GEN: nad, alert and oriented HEENT: ncat NECK: supple w/o LA CV: rrr. PULM: ctab, no inc wob ABD: soft, +bs EXT: no edema SKIN: Well-perfused

## 2024-02-04 DIAGNOSIS — Z0000 Encounter for general adult medical examination without abnormal findings: Secondary | ICD-10-CM | POA: Insufficient documentation

## 2024-02-04 NOTE — Assessment & Plan Note (Signed)
 Compliant with levothyroxine .  Previous TSH normal.  Discussed.  Continue as is.

## 2024-02-04 NOTE — Assessment & Plan Note (Signed)
 Would avoid NSAIDs.  He has renal follow-up pending for tomorrow.  Deferred labs today.  Previous creatinine discussed with patient.

## 2024-02-04 NOTE — Assessment & Plan Note (Signed)
 Dr. Loetta Ringer is retiring and we talked about asking to see Dr. Renna Cary with cardiology.   Controlled.  Continue amiodarone  amlodipine  carvedilol  Entresto  spironolactone .

## 2024-02-04 NOTE — Assessment & Plan Note (Signed)
 History of.  Deferred labs today since he is going to have blood drawn likely tomorrow at renal clinic.  Most recent hemoglobin still slightly low but stable.

## 2024-02-04 NOTE — Assessment & Plan Note (Signed)
 Fortunately without recent symptoms.  No change in plans at this point.

## 2024-02-04 NOTE — Assessment & Plan Note (Signed)
 History of ED.  Has used sildenafil  without complication.  No nitroglycerin use.

## 2024-02-04 NOTE — Assessment & Plan Note (Signed)
 Flu previously done Shingles discussed with patient PNA up-to-date Tetanus 2007. COVID-vaccine previously done RSV d/w pt.   Colon cancer screening deferred given his age.  He agrees. Prostate cancer screening deferred given his age.  He agrees to Best boy designated if patient were incapacitated.

## 2024-02-04 NOTE — Assessment & Plan Note (Signed)
Advance directive- daughter designated if patient were incapacitated.   

## 2024-02-04 NOTE — Assessment & Plan Note (Signed)
 History of.  Continue amiodarone  and beta-blocker and Eliquis .

## 2024-02-05 DIAGNOSIS — I129 Hypertensive chronic kidney disease with stage 1 through stage 4 chronic kidney disease, or unspecified chronic kidney disease: Secondary | ICD-10-CM | POA: Diagnosis not present

## 2024-02-05 DIAGNOSIS — E875 Hyperkalemia: Secondary | ICD-10-CM | POA: Diagnosis not present

## 2024-02-05 DIAGNOSIS — R5383 Other fatigue: Secondary | ICD-10-CM | POA: Diagnosis not present

## 2024-02-05 DIAGNOSIS — N183 Chronic kidney disease, stage 3 unspecified: Secondary | ICD-10-CM | POA: Diagnosis not present

## 2024-02-05 DIAGNOSIS — E785 Hyperlipidemia, unspecified: Secondary | ICD-10-CM | POA: Diagnosis not present

## 2024-02-05 DIAGNOSIS — I4892 Unspecified atrial flutter: Secondary | ICD-10-CM | POA: Diagnosis not present

## 2024-02-06 LAB — LAB REPORT - SCANNED
Creatinine, POC: 142.7 mg/dL
EGFR: 27

## 2024-02-09 NOTE — Telephone Encounter (Signed)
 Please request the renal note or the exact name of the med so I can see about possible interactions.  Thanks.

## 2024-02-09 NOTE — Telephone Encounter (Signed)
 Left message for patients daughter Adella Honey who is on the DPR to return call so that I can get the name of the medication as well as the name of the provider he saw.

## 2024-02-09 NOTE — Telephone Encounter (Signed)
 Spoke with patients daughter who is on the [place]. She is going to see if.there is a coupon for Good Rx that he can use to help with cost. She advised that the he saw the nephrologist Friday and they called told and wanted him to start a potassium pill(she is not sure of the name) but he is to take it 2 times a week. Can he take that and the sildenafil  if he gets that filled. Please advise

## 2024-02-09 NOTE — Telephone Encounter (Signed)
 Please let patient know that the PA was denied and he will likely have to pay out-of-pocket.  Thanks.

## 2024-02-09 NOTE — Telephone Encounter (Signed)
 Pharmacy Patient Advocate Encounter  Received notification from [employer] that Prior Authorization for Sildenafil  Citrate (PAH) 20MG  tablets  has been DENIED.  See denial reason below. No denial letter attached in CMM. Will attach denial letter to Media tab once received.   PA #/Case ID/Reference #: [id number]

## 2024-02-10 ENCOUNTER — Telehealth: Payer: Self-pay | Admitting: Family Medicine

## 2024-02-10 NOTE — Telephone Encounter (Signed)
 Do you still need further information?

## 2024-02-10 NOTE — Telephone Encounter (Signed)
 Copied from CRM [id number]. Topic: Clinical - Medication Question >> Feb 09, 2024  4:26 PM Juleen Oakland F wrote: Reason for CRM: Patient daughter requested a call back from [place] regarding patients medication - Lokelma 5mg . Please call her at [phone].

## 2024-02-10 NOTE — Telephone Encounter (Signed)
 Information added to previous encounter regarding this.

## 2024-02-11 MED ORDER — LOKELMA 5 G PO PACK: 5.0000 g | PACK | ORAL | Status: DC

## 2024-02-11 NOTE — Telephone Encounter (Signed)
 Pt's daughter, Adella Honey, had an appt today at office & asked if Dr. Vallarie Gauze was given the message regarding name of the meds the pt is taking. Leta also wanted to ask Dr. Vallarie Gauze if he believed it was okay for pt to take Lokelma 5mg  along with his other meds altogether? Please advise. Call back # [phone]

## 2024-02-11 NOTE — Telephone Encounter (Signed)
 Patients daughter notified of Dr. Azell Boll response

## 2024-02-11 NOTE — Telephone Encounter (Signed)
 It should be okay to take Lokelma as directed, along with his other listed medications.  Thanks.

## 2024-02-11 NOTE — Addendum Note (Signed)
 Addended by: Donnie Galea on: 02/11/2024 02:47 PM   Modules accepted: Orders

## 2024-02-28 DIAGNOSIS — Q619 Cystic kidney disease, unspecified: Secondary | ICD-10-CM | POA: Diagnosis not present

## 2024-02-28 DIAGNOSIS — N1832 Chronic kidney disease, stage 3b: Secondary | ICD-10-CM | POA: Diagnosis not present

## 2024-02-28 DIAGNOSIS — I77810 Thoracic aortic ectasia: Secondary | ICD-10-CM | POA: Diagnosis not present

## 2024-02-28 DIAGNOSIS — R601 Generalized edema: Secondary | ICD-10-CM | POA: Diagnosis not present

## 2024-02-28 DIAGNOSIS — E039 Hypothyroidism, unspecified: Secondary | ICD-10-CM | POA: Diagnosis not present

## 2024-02-28 DIAGNOSIS — J219 Acute bronchiolitis, unspecified: Secondary | ICD-10-CM | POA: Diagnosis not present

## 2024-02-28 DIAGNOSIS — N320 Bladder-neck obstruction: Secondary | ICD-10-CM | POA: Diagnosis not present

## 2024-02-28 DIAGNOSIS — Z87891 Personal history of nicotine dependence: Secondary | ICD-10-CM | POA: Diagnosis not present

## 2024-02-28 DIAGNOSIS — Z7901 Long term (current) use of anticoagulants: Secondary | ICD-10-CM | POA: Diagnosis not present

## 2024-02-28 DIAGNOSIS — I130 Hypertensive heart and chronic kidney disease with heart failure and stage 1 through stage 4 chronic kidney disease, or unspecified chronic kidney disease: Secondary | ICD-10-CM | POA: Diagnosis not present

## 2024-02-28 DIAGNOSIS — I3139 Other pericardial effusion (noninflammatory): Secondary | ICD-10-CM | POA: Diagnosis not present

## 2024-02-28 DIAGNOSIS — R7989 Other specified abnormal findings of blood chemistry: Secondary | ICD-10-CM | POA: Diagnosis not present

## 2024-02-28 DIAGNOSIS — R112 Nausea with vomiting, unspecified: Secondary | ICD-10-CM | POA: Diagnosis not present

## 2024-02-28 DIAGNOSIS — R338 Other retention of urine: Secondary | ICD-10-CM | POA: Diagnosis not present

## 2024-02-28 DIAGNOSIS — I5023 Acute on chronic systolic (congestive) heart failure: Secondary | ICD-10-CM | POA: Diagnosis not present

## 2024-02-28 DIAGNOSIS — D6869 Other thrombophilia: Secondary | ICD-10-CM | POA: Diagnosis not present

## 2024-02-28 DIAGNOSIS — N289 Disorder of kidney and ureter, unspecified: Secondary | ICD-10-CM | POA: Diagnosis not present

## 2024-02-28 DIAGNOSIS — I480 Paroxysmal atrial fibrillation: Secondary | ICD-10-CM | POA: Diagnosis not present

## 2024-02-28 DIAGNOSIS — N138 Other obstructive and reflux uropathy: Secondary | ICD-10-CM | POA: Diagnosis not present

## 2024-02-28 DIAGNOSIS — N1330 Unspecified hydronephrosis: Secondary | ICD-10-CM | POA: Diagnosis not present

## 2024-02-28 DIAGNOSIS — N3289 Other specified disorders of bladder: Secondary | ICD-10-CM | POA: Diagnosis not present

## 2024-02-28 DIAGNOSIS — R339 Retention of urine, unspecified: Secondary | ICD-10-CM | POA: Diagnosis not present

## 2024-02-28 DIAGNOSIS — R310 Gross hematuria: Secondary | ICD-10-CM | POA: Diagnosis not present

## 2024-02-28 DIAGNOSIS — I10 Essential (primary) hypertension: Secondary | ICD-10-CM | POA: Diagnosis not present

## 2024-02-28 DIAGNOSIS — N261 Atrophy of kidney (terminal): Secondary | ICD-10-CM | POA: Diagnosis not present

## 2024-02-28 DIAGNOSIS — R1110 Vomiting, unspecified: Secondary | ICD-10-CM | POA: Diagnosis not present

## 2024-02-28 DIAGNOSIS — I509 Heart failure, unspecified: Secondary | ICD-10-CM | POA: Diagnosis not present

## 2024-02-29 DIAGNOSIS — I77810 Thoracic aortic ectasia: Secondary | ICD-10-CM | POA: Diagnosis not present

## 2024-02-29 DIAGNOSIS — J219 Acute bronchiolitis, unspecified: Secondary | ICD-10-CM | POA: Diagnosis not present

## 2024-03-03 ENCOUNTER — Telehealth: Payer: Self-pay

## 2024-03-03 ENCOUNTER — Telehealth: Payer: Self-pay | Admitting: Family Medicine

## 2024-03-03 DIAGNOSIS — M47811 Spondylosis without myelopathy or radiculopathy, occipito-atlanto-axial region: Secondary | ICD-10-CM | POA: Diagnosis not present

## 2024-03-03 DIAGNOSIS — Z7901 Long term (current) use of anticoagulants: Secondary | ICD-10-CM | POA: Diagnosis not present

## 2024-03-03 DIAGNOSIS — I480 Paroxysmal atrial fibrillation: Secondary | ICD-10-CM | POA: Diagnosis not present

## 2024-03-03 DIAGNOSIS — I5023 Acute on chronic systolic (congestive) heart failure: Secondary | ICD-10-CM | POA: Diagnosis not present

## 2024-03-03 DIAGNOSIS — I130 Hypertensive heart and chronic kidney disease with heart failure and stage 1 through stage 4 chronic kidney disease, or unspecified chronic kidney disease: Secondary | ICD-10-CM | POA: Diagnosis not present

## 2024-03-03 DIAGNOSIS — N138 Other obstructive and reflux uropathy: Secondary | ICD-10-CM | POA: Diagnosis not present

## 2024-03-03 DIAGNOSIS — Z9181 History of falling: Secondary | ICD-10-CM | POA: Diagnosis not present

## 2024-03-03 DIAGNOSIS — N1832 Chronic kidney disease, stage 3b: Secondary | ICD-10-CM | POA: Diagnosis not present

## 2024-03-03 DIAGNOSIS — Z8673 Personal history of transient ischemic attack (TIA), and cerebral infarction without residual deficits: Secondary | ICD-10-CM | POA: Diagnosis not present

## 2024-03-03 NOTE — Telephone Encounter (Signed)
 I can follow re: HH orders.   Please schedule hospital f/u when possible.   About the second message- He is anticoagulated and he needs ER eval if he has active hematuria.  I am not okay with only a u/a at home.  I would get him to ER to recheck him and check his HGB/labs concurrently.

## 2024-03-03 NOTE — Transitions of Care (Post Inpatient/ED Visit) (Signed)
   03/03/2024  Name: John Hines MRN: [id number] DOB: 1931/11/22  Today's TOC FU Call Status: Today's TOC FU Call Status:: Unsuccessful Call (1st Attempt) Unsuccessful Call (1st Attempt) Date: 03/03/24  Attempted to reach the patient regarding the most recent Inpatient/ED visit. Left a voicemail message requesting a return call.  Follow Up Plan: Additional outreach attempts will be made to reach the patient to complete the Transitions of Care (Post Inpatient/ED visit) call.   [place], RN, BSN, CCM [facility], [facility] RN Care Manager Direct Dial: [phone]

## 2024-03-03 NOTE — Telephone Encounter (Signed)
 Copied from CRM [id number]. Topic: Clinical - Request for Lab/Test Order >> Mar 03, 2024 10:53 AM Deaijah H wrote: Reason for CRM: [facility] called in patient is having very red urine in his bag. Would like an UA. Callback [phone]

## 2024-03-03 NOTE — Telephone Encounter (Signed)
 This reference 2 different encounters. The one in reference to confirming if Dr. Vallarie Gauze can follow and sign [facility] paperwork Dr. Vallarie Gauze will confirm. Cindy from the first message has been notified.    As for the Carmelina Chinchilla that is in the field I left her a voicemail to return my call the provider would like patient to go the ED in reference to blood in his urine. I also called the [facility] facility locally and had them send a message to her as well since this is an urgent matter. Awaiting a callback

## 2024-03-03 NOTE — Telephone Encounter (Signed)
 Copied from CRM [id number]. Topic: General - Other >> Mar 02, 2024  4:33 PM Hamp Levine R wrote: Reason for CRM: Carmelina Chinchilla from [facility] calling to confirm if Dr Vallarie Gauze can follow and sign the patients at home health care requirements since he is discharging from the hospital.  Carmelina Chinchilla can be reached at [phone]

## 2024-03-04 ENCOUNTER — Telehealth: Payer: Self-pay

## 2024-03-04 ENCOUNTER — Inpatient Hospital Stay: Admitting: Family Medicine

## 2024-03-04 NOTE — Telephone Encounter (Unsigned)
 Copied from CRM [id number]. Topic: Clinical - Medical Advice >> Mar 04, 2024 11:11 AM Dewanda Foots wrote: Reason for CRM: Carmelina Chinchilla from [facility] is calling to update the PCP, she spoke with urology and they told her to hold off on the UA for now, as he has an appt there with them in 2 weeks. Just wanted the PCP to know this.  Contact information for Carmelina Chinchilla is [phone].

## 2024-03-04 NOTE — Telephone Encounter (Signed)
 FYI  Patient is scheduled to be seen tomorrow. It does not seem as if they advised him of going to the ED

## 2024-03-04 NOTE — Telephone Encounter (Signed)
 Left message for John Hines to return call in reference to UA

## 2024-03-04 NOTE — Transitions of Care (Post Inpatient/ED Visit) (Signed)
   03/04/2024  Name: John Hines MRN: [id number] DOB: October 26, 1931  Today's TOC FU Call Status: Today's TOC FU Call Status:: Unsuccessful Call (2nd Attempt) Unsuccessful Call (2nd Attempt) Date: 03/04/24  Attempted to reach the patient regarding the most recent Inpatient/ED visit.  Left a HIPAA approve voicemail message on answering machine.  Follow Up Plan: Additional outreach attempts will be made to reach the patient to complete the Transitions of Care (Post Inpatient/ED visit) call.   Brown Cape, RN, BSN, CCM [facility], [facility] RN Care Manager Direct Dial: [phone]

## 2024-03-05 ENCOUNTER — Other Ambulatory Visit: Payer: Self-pay

## 2024-03-05 ENCOUNTER — Ambulatory Visit (INDEPENDENT_AMBULATORY_CARE_PROVIDER_SITE_OTHER): Admitting: Family Medicine

## 2024-03-05 ENCOUNTER — Encounter: Payer: Self-pay | Admitting: Family Medicine

## 2024-03-05 VITALS — BP 138/68 | HR 74 | Temp 98.6°F | Ht 66.54 in | Wt 164.0 lb

## 2024-03-05 DIAGNOSIS — I5022 Chronic systolic (congestive) heart failure: Secondary | ICD-10-CM

## 2024-03-05 DIAGNOSIS — N400 Enlarged prostate without lower urinary tract symptoms: Secondary | ICD-10-CM | POA: Diagnosis not present

## 2024-03-05 DIAGNOSIS — N189 Chronic kidney disease, unspecified: Secondary | ICD-10-CM

## 2024-03-05 MED ORDER — EMPAGLIFLOZIN 10 MG PO TABS: 10.0000 mg | ORAL_TABLET | Freq: Every day | ORAL | Status: DC

## 2024-03-05 NOTE — Progress Notes (Unsigned)
 Physician Discharge Summary  Admit date: 02/28/2024  Discharge date and time: 03/02/2024, 0842  Discharge to: Home  Discharge Service: General Medicine (MED)  Discharge Attending Physician: Margarie Shay, MD  Discharge Diagnoses: Acute on chronic congestive heart failure with reduced ejection fraction, BPH with bladder outlet obstruction, paroxysmal atrial fibrillation, elevated creatinine, chronic kidney disease, stage IIIb  Procedures: Foley catheter placement  Hospital Course:  89 year old gentleman with past medical history of hypertension, paroxysmal atrial fibrillation on Eliquis  presented to the hospital with ongoing symptom of bilateral worsening leg swelling, for 2-3 days. Patient apparently got married day before his presentation, states that he had multiple episodes of emesis, exertional shortness of breath following which EMS was called and patient was brought to the ER on 6/7. Workup in the ER revealed significantly elevated proBNP, bilateral venous congestion involving his chest x-ray, following which patient was treated for volume overload with IV Lasix with improvement in his leg swelling. Patient was subsequently admitted to the hospital medicine service, cardiology was consulted, patient underwent echocardiogram that showed reduced ejection fraction of 40-45%. Cardiology recommended conservative management with GDMT. Patient at baseline also has chronic kidney disease stage IIIb, with baseline creatinine around 2.2-2.4, presented with creatinine of 2.5 that has improved to 2.2 at the day of his discharge. Patient is on home Eliquis , Coreg  that were continued. Patient will be initiated on Jardiance at the time of discharge. ACE inhibitor/ARB, Aldactone  is not initiated due to his CKD at this time, will need outpatient follow-up with cardiology, primary care physician for reassessment for that. Patient also during his hospital course noted to have distended abdomen, following which  CT abdomen and pelvis showed bladder outlet obstruction with BPH following which urology was consulted. Urology recommended Foley catheter placement that was successful. Patient will need outpatient follow-up with urology for definitive management of his bladder outlet obstruction with BPH.  Visit Vitals BP 106/57  Pulse 69  Temp 36.8 C (98.2 F) (Oral)  Resp 18   Physical exam GENERAL: In no acute distress,  HEENT: Nonicteric sclerae, PERRLA, EOMI. Oropharynx clear. Moist mucous membranes. Conjunctivae appear well perfused. CHEST: Chest wall is nontender.  HEART: Irregularly irregular rate and rhythm LUNGS: Bilateral breath sounds heard, no wheezing, crackles, rhonchi ABDOMEN: Soft, positive bowel sounds, nontender, no organomegaly. Foley catheter in place. SKIN: No rash, no excessive bruising, petechiae, or purpura. Neuro - awake, alert, oriented x3, no focal deficits, sensory deficits, cranial nerve deficits.  Condition at Discharge: stable Discharge Medications:   Your Medication List    START taking these medications   apixaban  2.5 mg Tab Commonly known as: ELIQUIS  Take 1 tablet (2.5 mg total) by mouth two (2) times a day.  carvedilol  12.5 MG tablet Commonly known as: COREG  Take 1 tablet (12.5 mg total) by mouth two (2) times a day.  empagliflozin 10 mg tablet Commonly known as: JARDIANCE Take 1 tablet (10 mg total) by mouth daily.  furosemide 20 MG tablet Commonly known as: LASIX Take 1 tablet (20 mg total) by mouth two (2) times a day.  tamsulosin  0.4 mg capsule Commonly known as: FLOMAX  Take 1 capsule (0.4 mg total) by mouth nightly.   ======================= He still has blood tinged urine in his leg bag.  No abd pain.  No fevers.    Not SOB.  Vomiting resolved.  No CP.    He thought he may have stopped iron in the meantime.  I asked him to check on that.   He hasn't been taking lokelma   the last few days.    Not currently on sildenafil .

## 2024-03-05 NOTE — Transitions of Care (Post Inpatient/ED Visit) (Signed)
   03/05/2024  Name: John Hines MRN: [id number] DOB: 06/05/1932  Today's TOC FU Call Status: Today's TOC FU Call Status:: Unsuccessful Call (3rd Attempt) Unsuccessful Call (3rd Attempt) Date: 03/05/24  Attempted to reach the patient regarding the most recent Inpatient/ED visit. Left a HIPAA approved voicemail message requesting a return call back.  Follow Up Plan: No further outreach attempts will be made at this time. We have been unable to contact the patient.  Brown Cape, RN, BSN, CCM [facility], [facility] RN Care Manager Direct Dial: [phone]

## 2024-03-05 NOTE — Telephone Encounter (Signed)
 Will see today.

## 2024-03-05 NOTE — Patient Instructions (Addendum)
 Please let me know if you are still taking iron/ferrous sulfate .  Go to the lab on the way out.   If you have mychart we'll likely use that to update you.    Take care.  Glad to see you.  Please call about trying to get urology appointment moved sooner.   If you have fevers, abdominal pain, more blood in your bag, then call urology or get to the ER.

## 2024-03-06 LAB — CBC WITH DIFFERENTIAL/PLATELET
Absolute Lymphocytes: 1250 {cells}/uL (ref 850–3900)
Absolute Monocytes: 942 {cells}/uL (ref 200–950)
Basophils Absolute: 0 {cells}/uL (ref 0–200)
Basophils Relative: 0 %
Eosinophils Absolute: 30 {cells}/uL (ref 15–500)
Eosinophils Relative: 0.8 %
HCT: 33 % — ABNORMAL LOW (ref 38.5–50.0)
Hemoglobin: 10.4 g/dL — ABNORMAL LOW (ref 13.2–17.1)
MCH: 25.6 pg — ABNORMAL LOW (ref 27.0–33.0)
MCHC: 31.5 g/dL — ABNORMAL LOW (ref 32.0–36.0)
MCV: 81.3 fL (ref 80.0–100.0)
MPV: 9.9 fL (ref 7.5–12.5)
Monocytes Relative: 24.8 %
Neutro Abs: 1577 {cells}/uL (ref 1500–7800)
Neutrophils Relative %: 41.5 %
Platelets: 195 10*3/uL (ref 140–400)
RBC: 4.06 10*6/uL — ABNORMAL LOW (ref 4.20–5.80)
RDW: 15.3 % — ABNORMAL HIGH (ref 11.0–15.0)
Total Lymphocyte: 32.9 %
WBC: 3.8 10*3/uL (ref 3.8–10.8)

## 2024-03-06 LAB — COMPREHENSIVE METABOLIC PANEL WITH GFR
AG Ratio: 1.8 (calc) (ref 1.0–2.5)
ALT: 6 U/L — ABNORMAL LOW (ref 9–46)
AST: 12 U/L (ref 10–35)
Albumin: 4.2 g/dL (ref 3.6–5.1)
Alkaline phosphatase (APISO): 108 U/L (ref 35–144)
BUN/Creatinine Ratio: 10 (calc) (ref 6–22)
BUN: 25 mg/dL (ref 7–25)
CO2: 28 mmol/L (ref 20–32)
Calcium: 8.8 mg/dL (ref 8.6–10.3)
Chloride: 105 mmol/L (ref 98–110)
Creat: 2.54 mg/dL — ABNORMAL HIGH (ref 0.70–1.22)
Globulin: 2.3 g/dL (ref 1.9–3.7)
Glucose, Bld: 102 mg/dL — ABNORMAL HIGH (ref 65–99)
Potassium: 4.3 mmol/L (ref 3.5–5.3)
Sodium: 143 mmol/L (ref 135–146)
Total Bilirubin: 0.6 mg/dL (ref 0.2–1.2)
Total Protein: 6.5 g/dL (ref 6.1–8.1)
eGFR: 23 mL/min/{1.73_m2} — ABNORMAL LOW (ref >=60)

## 2024-03-06 LAB — FERRITIN: Ferritin: 325 ng/mL (ref 24–380)

## 2024-03-06 NOTE — Assessment & Plan Note (Signed)
 History of, requiring diuresis.  He previously had to take Lokelma .  He had not taken it recently, given recent illness.  See notes on labs.  Continue furosemide as is for now, with the acknowledgment that we may need to adjust that depending on his creatinine and fluid status.  At this point still okay for outpatient follow-up.  He agrees with plan.

## 2024-03-06 NOTE — Assessment & Plan Note (Signed)
 With outlet obstruction requiring Foley placement and subsequently having hematuria.  Discussed his situation.  He is currently anticoagulated.  If he has increasing hematuria or belly pain or fever then I want him to go to the emergency room.  I think it makes sense to recheck his lab.  If he has a dramatic change in his hemoglobin we will need to address his anticoagulation.  Patient/family can call about urology follow-up to get that moved sooner.  If his hemoglobin is still reasonable then he may be able to continue anticoagulation.  My concern is that if he has removal of Foley at the office visit today, he may fail a voiding trial and require ER evaluation tonight for repeat placement.  Discussed.  He agrees with plan.

## 2024-03-07 ENCOUNTER — Other Ambulatory Visit: Payer: Self-pay | Admitting: Family Medicine

## 2024-03-07 ENCOUNTER — Ambulatory Visit: Payer: Self-pay | Admitting: Family Medicine

## 2024-03-07 DIAGNOSIS — N189 Chronic kidney disease, unspecified: Secondary | ICD-10-CM

## 2024-03-07 MED ORDER — FUROSEMIDE 20 MG PO TABS: ORAL_TABLET | ORAL | Status: DC

## 2024-03-09 ENCOUNTER — Telehealth: Payer: Self-pay

## 2024-03-09 DIAGNOSIS — I5023 Acute on chronic systolic (congestive) heart failure: Secondary | ICD-10-CM | POA: Diagnosis not present

## 2024-03-09 DIAGNOSIS — M47811 Spondylosis without myelopathy or radiculopathy, occipito-atlanto-axial region: Secondary | ICD-10-CM | POA: Diagnosis not present

## 2024-03-09 DIAGNOSIS — N138 Other obstructive and reflux uropathy: Secondary | ICD-10-CM | POA: Diagnosis not present

## 2024-03-09 DIAGNOSIS — Z8673 Personal history of transient ischemic attack (TIA), and cerebral infarction without residual deficits: Secondary | ICD-10-CM | POA: Diagnosis not present

## 2024-03-09 DIAGNOSIS — Z9181 History of falling: Secondary | ICD-10-CM | POA: Diagnosis not present

## 2024-03-09 DIAGNOSIS — I480 Paroxysmal atrial fibrillation: Secondary | ICD-10-CM | POA: Diagnosis not present

## 2024-03-09 DIAGNOSIS — Z7901 Long term (current) use of anticoagulants: Secondary | ICD-10-CM | POA: Diagnosis not present

## 2024-03-09 DIAGNOSIS — I130 Hypertensive heart and chronic kidney disease with heart failure and stage 1 through stage 4 chronic kidney disease, or unspecified chronic kidney disease: Secondary | ICD-10-CM | POA: Diagnosis not present

## 2024-03-09 DIAGNOSIS — N1832 Chronic kidney disease, stage 3b: Secondary | ICD-10-CM | POA: Diagnosis not present

## 2024-03-09 NOTE — Telephone Encounter (Signed)
 Copied from CRM [id number]. Topic: Clinical - Prescription Issue >> Mar 09, 2024  1:51 PM Dewanda Foots wrote: Reason for CRM: Daughter states this medication was sent to the wrong pharmacy and would like it to please be sent to   sodium zirconium cyclosilicate  (LOKELMA ) 5 g packet   [id number] [place], [place] - [address] [place] [address] Phone: [phone] Fax: [phone] Hours: Not open 24 hours

## 2024-03-09 NOTE — Telephone Encounter (Signed)
 Copied from CRM [id number]. Topic: General - Other >> Mar 09, 2024 11:47 AM Bambi Bonine D wrote: Reason for CRM: Pt stated that he recently sent a message to Dr.Duncan and would like for the office to disregard the message.

## 2024-03-09 NOTE — Telephone Encounter (Signed)
 Copied from CRM [id number]. Topic: Clinical - Medication Question >> Mar 09, 2024 11:38 AM John Hines wrote: Reason for CRM: Shown has not been taking furosemide (LASIX) 20 MG tablet since his discharge from the hospital on 6/11 , wanting to know if he needs to start taking it again

## 2024-03-09 NOTE — Telephone Encounter (Signed)
Closing encounter per patient's request.

## 2024-03-09 NOTE — Telephone Encounter (Signed)
 Returned call and spoke with patients wife who is on the [place]. At this time Dr. Vallarie Gauze does not want him to restart the Lokelma  therefore rx is not being sent to [facility]. Patients wife understood.

## 2024-03-10 ENCOUNTER — Telehealth: Payer: Self-pay | Admitting: Family Medicine

## 2024-03-10 NOTE — Telephone Encounter (Signed)
 Per last message call and be disregarded. Closing encounter

## 2024-03-10 NOTE — Telephone Encounter (Signed)
 Copied from CRM [id number]. Topic: Clinical - Medication Question >> Mar 10, 2024 10:50 AM Clyde Darling P wrote: Reason for CRM: John Hines pt spouse concern about levothyroxine  (SYNTHROID ) 75 MCG tablet if pt should take it if so a new prescription would be needed or rotation supply as he is not home at the moment - [id number] [facility]) >> Mar 10, 2024 11:19 AM Adonis Hoot wrote: Patient wife called In to let Dr Vallarie Gauze know to disregard this CRM that was placed.She has everything all figured out now.

## 2024-03-18 ENCOUNTER — Other Ambulatory Visit (INDEPENDENT_AMBULATORY_CARE_PROVIDER_SITE_OTHER)

## 2024-03-18 DIAGNOSIS — N289 Disorder of kidney and ureter, unspecified: Secondary | ICD-10-CM | POA: Diagnosis not present

## 2024-03-18 DIAGNOSIS — I480 Paroxysmal atrial fibrillation: Secondary | ICD-10-CM | POA: Diagnosis not present

## 2024-03-18 DIAGNOSIS — N189 Chronic kidney disease, unspecified: Secondary | ICD-10-CM

## 2024-03-18 DIAGNOSIS — I130 Hypertensive heart and chronic kidney disease with heart failure and stage 1 through stage 4 chronic kidney disease, or unspecified chronic kidney disease: Secondary | ICD-10-CM | POA: Diagnosis not present

## 2024-03-18 DIAGNOSIS — M47811 Spondylosis without myelopathy or radiculopathy, occipito-atlanto-axial region: Secondary | ICD-10-CM | POA: Diagnosis not present

## 2024-03-18 DIAGNOSIS — N138 Other obstructive and reflux uropathy: Secondary | ICD-10-CM | POA: Diagnosis not present

## 2024-03-18 DIAGNOSIS — N1832 Chronic kidney disease, stage 3b: Secondary | ICD-10-CM | POA: Diagnosis not present

## 2024-03-18 DIAGNOSIS — I5023 Acute on chronic systolic (congestive) heart failure: Secondary | ICD-10-CM | POA: Diagnosis not present

## 2024-03-18 DIAGNOSIS — N401 Enlarged prostate with lower urinary tract symptoms: Secondary | ICD-10-CM

## 2024-03-18 DIAGNOSIS — Z9181 History of falling: Secondary | ICD-10-CM | POA: Diagnosis not present

## 2024-03-18 DIAGNOSIS — Z7901 Long term (current) use of anticoagulants: Secondary | ICD-10-CM | POA: Diagnosis not present

## 2024-03-18 DIAGNOSIS — R339 Retention of urine, unspecified: Secondary | ICD-10-CM | POA: Diagnosis not present

## 2024-03-18 DIAGNOSIS — Z8673 Personal history of transient ischemic attack (TIA), and cerebral infarction without residual deficits: Secondary | ICD-10-CM | POA: Diagnosis not present

## 2024-03-19 ENCOUNTER — Other Ambulatory Visit

## 2024-03-19 LAB — BASIC METABOLIC PANEL WITH GFR
BUN: 26 mg/dL — ABNORMAL HIGH (ref 6–23)
CO2: 25 meq/L (ref 19–32)
Calcium: 8.7 mg/dL (ref 8.4–10.5)
Chloride: 108 meq/L (ref 96–112)
Creatinine, Ser: 2.45 mg/dL — ABNORMAL HIGH (ref 0.40–1.50)
GFR: 22.66 mL/min — ABNORMAL LOW (ref >60.00)
Glucose, Bld: 92 mg/dL (ref 70–99)
Potassium: 4.9 meq/L (ref 3.5–5.1)
Sodium: 140 meq/L (ref 135–145)

## 2024-03-19 LAB — CBC WITH DIFFERENTIAL/PLATELET
Basophils Relative: 0 % (ref 0.0–3.0)
Eosinophils Relative: 0 % (ref 0.0–5.0)
HCT: 29.1 % — ABNORMAL LOW (ref 39.0–52.0)
Hemoglobin: 9.4 g/dL — ABNORMAL LOW (ref 13.0–17.0)
Lymphocytes Relative: 61 % — ABNORMAL HIGH (ref 12.0–46.0)
MCHC: 32.3 g/dL (ref 30.0–36.0)
MCV: 78.6 fl (ref 78.0–100.0)
Monocytes Relative: 11 % (ref 3.0–12.0)
Neutrophils Relative %: 28 % — ABNORMAL LOW (ref 43.0–77.0)
Platelets: 220 10*3/uL (ref 150.0–400.0)
RBC: 3.71 Mil/uL — ABNORMAL LOW (ref 4.22–5.81)
RDW: 18 % — ABNORMAL HIGH (ref 11.5–15.5)
WBC: 3.3 10*3/uL — ABNORMAL LOW (ref 4.0–10.5)

## 2024-03-21 ENCOUNTER — Ambulatory Visit: Payer: Self-pay | Admitting: Family Medicine

## 2024-03-21 ENCOUNTER — Other Ambulatory Visit: Payer: Self-pay | Admitting: Family Medicine

## 2024-03-21 DIAGNOSIS — N189 Chronic kidney disease, unspecified: Secondary | ICD-10-CM

## 2024-03-24 ENCOUNTER — Telehealth: Payer: Self-pay

## 2024-03-24 DIAGNOSIS — N1832 Chronic kidney disease, stage 3b: Secondary | ICD-10-CM | POA: Diagnosis not present

## 2024-03-24 DIAGNOSIS — I5023 Acute on chronic systolic (congestive) heart failure: Secondary | ICD-10-CM | POA: Diagnosis not present

## 2024-03-24 DIAGNOSIS — I130 Hypertensive heart and chronic kidney disease with heart failure and stage 1 through stage 4 chronic kidney disease, or unspecified chronic kidney disease: Secondary | ICD-10-CM | POA: Diagnosis not present

## 2024-03-24 DIAGNOSIS — Z8673 Personal history of transient ischemic attack (TIA), and cerebral infarction without residual deficits: Secondary | ICD-10-CM | POA: Diagnosis not present

## 2024-03-24 DIAGNOSIS — Z7901 Long term (current) use of anticoagulants: Secondary | ICD-10-CM | POA: Diagnosis not present

## 2024-03-24 DIAGNOSIS — M47811 Spondylosis without myelopathy or radiculopathy, occipito-atlanto-axial region: Secondary | ICD-10-CM | POA: Diagnosis not present

## 2024-03-24 DIAGNOSIS — N138 Other obstructive and reflux uropathy: Secondary | ICD-10-CM | POA: Diagnosis not present

## 2024-03-24 DIAGNOSIS — I480 Paroxysmal atrial fibrillation: Secondary | ICD-10-CM | POA: Diagnosis not present

## 2024-03-24 DIAGNOSIS — Z9181 History of falling: Secondary | ICD-10-CM | POA: Diagnosis not present

## 2024-03-24 NOTE — Telephone Encounter (Signed)
 Please give the order.  Thanks.

## 2024-03-24 NOTE — Telephone Encounter (Signed)
 Copied from CRM [id number]. Topic: Clinical - Home Health Verbal Orders >> Mar 24, 2024  3:56 PM Robinson DEL wrote: Caller/Agency: [facility] Callback Number: [phone] Service Requested: Skilled Nursing Frequency: 3 weeks patient still has catheter goes in 2 weeks to see if can have it removed Any new concerns about the patient? No

## 2024-03-25 NOTE — Telephone Encounter (Signed)
 Called and spoke with Lele from [facility]. Informed of approval for skilled nursing per pcp.

## 2024-03-29 ENCOUNTER — Telehealth: Payer: Self-pay

## 2024-03-29 ENCOUNTER — Ambulatory Visit: Payer: Self-pay

## 2024-03-29 NOTE — Telephone Encounter (Signed)
 Called pt but no answer so left VM letting pt know Dr. Elfredia instructions

## 2024-03-29 NOTE — Telephone Encounter (Signed)
 I spoke with Leta who was calling for John Hines who had a cough and Leta (DPR signed) wanted to know what cough med pt could take since robitussin was not helping.  When I spoke with Leta on 03/29/24 pt coughing has stopped and nothing further was needed. Leta will cb if needed. Sending FYI to Dr Cleatus.

## 2024-03-29 NOTE — Telephone Encounter (Signed)
 tamsulosin  (FLOMAX ) can cause lightheadedness. Would skip one dose and check with the rx'ing clinic.  Thanks.

## 2024-03-29 NOTE — Telephone Encounter (Signed)
 FYI Only or Action Required?: FYI only for provider.  Patient was last seen in primary care on 03/05/2024 by Cleatus Arlyss RAMAN, MD.  Called Nurse Triage reporting No chief complaint on file..  Symptoms began several days ago.  Interventions attempted: Nothing.  Symptoms are: unchanged.  Triage Disposition: Information or Advice Only Call  Patient/caregiver understands and will follow disposition?: Yes    Copied from CRM [id number]. Topic: Clinical - Red Word Triage >> Mar 29, 2024  3:41 PM Martinique E wrote: [facility] that prompted transfer to Nurse Triage: Dizziness. Patient is taking tamsulosin  (FLOMAX ) 0.4 MG CAPS capsule which is causing dizziness, going on for a couple days.  Reason for Disposition  Caller has medicine question, adult has minor symptoms, caller declines triage, AND triager answers question  Answer Assessment - Initial Assessment Questions 1. NAME of MEDICINE: What medicine(s) are you calling about?     Flomax  (Tamulosin)  2. QUESTION: What is your question? (e.g., double dose of medicine, side effect)     Increase in frequency of the dizziness  3. PRESCRIBER: Who prescribed the medicine? Reason: if prescribed by specialist, call should be referred to that group.     Dr. Nada  4. SYMPTOMS: Do you have any symptoms? If Yes, ask: What symptoms are you having?  How bad are the symptoms (e.g., mild, moderate, severe)     Dizziness  Protocols used: Medication Question Call-A-AH

## 2024-03-29 NOTE — Telephone Encounter (Signed)
 Noted. Thanks.

## 2024-03-31 ENCOUNTER — Other Ambulatory Visit (INDEPENDENT_AMBULATORY_CARE_PROVIDER_SITE_OTHER)

## 2024-03-31 DIAGNOSIS — Z8673 Personal history of transient ischemic attack (TIA), and cerebral infarction without residual deficits: Secondary | ICD-10-CM | POA: Diagnosis not present

## 2024-03-31 DIAGNOSIS — N189 Chronic kidney disease, unspecified: Secondary | ICD-10-CM

## 2024-03-31 DIAGNOSIS — I130 Hypertensive heart and chronic kidney disease with heart failure and stage 1 through stage 4 chronic kidney disease, or unspecified chronic kidney disease: Secondary | ICD-10-CM | POA: Diagnosis not present

## 2024-03-31 DIAGNOSIS — Z7901 Long term (current) use of anticoagulants: Secondary | ICD-10-CM | POA: Diagnosis not present

## 2024-03-31 DIAGNOSIS — N138 Other obstructive and reflux uropathy: Secondary | ICD-10-CM | POA: Diagnosis not present

## 2024-03-31 DIAGNOSIS — I5023 Acute on chronic systolic (congestive) heart failure: Secondary | ICD-10-CM | POA: Diagnosis not present

## 2024-03-31 DIAGNOSIS — I480 Paroxysmal atrial fibrillation: Secondary | ICD-10-CM | POA: Diagnosis not present

## 2024-03-31 DIAGNOSIS — M47811 Spondylosis without myelopathy or radiculopathy, occipito-atlanto-axial region: Secondary | ICD-10-CM | POA: Diagnosis not present

## 2024-03-31 DIAGNOSIS — Z9181 History of falling: Secondary | ICD-10-CM | POA: Diagnosis not present

## 2024-03-31 DIAGNOSIS — N1832 Chronic kidney disease, stage 3b: Secondary | ICD-10-CM | POA: Diagnosis not present

## 2024-03-31 LAB — BASIC METABOLIC PANEL WITH GFR
BUN: 42 mg/dL — ABNORMAL HIGH (ref 6–23)
CO2: 28 meq/L (ref 19–32)
Calcium: 9.1 mg/dL (ref 8.4–10.5)
Chloride: 106 meq/L (ref 96–112)
Creatinine, Ser: 3.45 mg/dL — ABNORMAL HIGH (ref 0.40–1.50)
GFR: 15.02 mL/min — ABNORMAL LOW (ref >60.00)
Glucose, Bld: 102 mg/dL — ABNORMAL HIGH (ref 70–99)
Potassium: 5.1 meq/L (ref 3.5–5.1)
Sodium: 142 meq/L (ref 135–145)

## 2024-03-31 LAB — CBC WITH DIFFERENTIAL/PLATELET
Basophils Absolute: 0 K/uL (ref 0.0–0.1)
Basophils Relative: 0.3 % (ref 0.0–3.0)
Eosinophils Absolute: 0 K/uL (ref 0.0–0.7)
Eosinophils Relative: 1.3 % (ref 0.0–5.0)
HCT: 30.1 % — ABNORMAL LOW (ref 39.0–52.0)
Hemoglobin: 9.8 g/dL — ABNORMAL LOW (ref 13.0–17.0)
Lymphocytes Relative: 39.8 % (ref 12.0–46.0)
Lymphs Abs: 1.3 K/uL (ref 0.7–4.0)
MCHC: 32.6 g/dL (ref 30.0–36.0)
MCV: 78.9 fl (ref 78.0–100.0)
Monocytes Absolute: 0.6 K/uL (ref 0.1–1.0)
Monocytes Relative: 19.9 % — ABNORMAL HIGH (ref 3.0–12.0)
Neutro Abs: 1.2 K/uL — ABNORMAL LOW (ref 1.4–7.7)
Neutrophils Relative %: 38.7 % — ABNORMAL LOW (ref 43.0–77.0)
Platelets: 196 K/uL (ref 150.0–400.0)
RBC: 3.81 Mil/uL — ABNORMAL LOW (ref 4.22–5.81)
RDW: 17.8 % — ABNORMAL HIGH (ref 11.5–15.5)
WBC: 3.2 K/uL — ABNORMAL LOW (ref 4.0–10.5)

## 2024-04-04 ENCOUNTER — Ambulatory Visit: Payer: Self-pay | Admitting: Family Medicine

## 2024-04-04 DIAGNOSIS — N189 Chronic kidney disease, unspecified: Secondary | ICD-10-CM

## 2024-04-08 ENCOUNTER — Other Ambulatory Visit: Payer: Self-pay | Admitting: Family Medicine

## 2024-04-08 ENCOUNTER — Other Ambulatory Visit (INDEPENDENT_AMBULATORY_CARE_PROVIDER_SITE_OTHER)

## 2024-04-08 DIAGNOSIS — N189 Chronic kidney disease, unspecified: Secondary | ICD-10-CM | POA: Diagnosis not present

## 2024-04-08 DIAGNOSIS — N138 Other obstructive and reflux uropathy: Secondary | ICD-10-CM | POA: Diagnosis not present

## 2024-04-08 DIAGNOSIS — N289 Disorder of kidney and ureter, unspecified: Secondary | ICD-10-CM | POA: Diagnosis not present

## 2024-04-08 NOTE — Telephone Encounter (Signed)
 Copied from CRM [id number]. Topic: Clinical - Medication Refill >> Apr 08, 2024 11:08 AM Deleta RAMAN wrote: Medication: Jardiance  10 mg  Has the patient contacted their pharmacy? No (Agent: If no, request that the patient contact the pharmacy for the refill. If patient does not wish to contact the pharmacy document the reason why and proceed with request.) (Agent: If yes, when and what did the pharmacy advise?) patient is unsure   This is the patient's preferred pharmacy:  [address], [place], [place] [address]  Phone [phone]  Is this the correct pharmacy for this prescription? Yes If no, delete pharmacy and type the correct one.   Has the prescription been filled recently? Yes  Is the patient out of the medication? Yes  Has the patient been seen for an appointment in the last year OR does the patient have an upcoming appointment? Yes  Can we respond through MyChart? No  Agent: Please be advised that Rx refills may take up to 3 business days. We ask that you follow-up with your pharmacy.

## 2024-04-08 NOTE — Telephone Encounter (Unsigned)
 Copied from CRM [id number]. Topic: Clinical - Medication Refill >> Apr 08, 2024 11:08 AM Deleta RAMAN wrote: Medication: Jardiance  10 mg  Has the patient contacted their pharmacy? No (Agent: If no, request that the patient contact the pharmacy for the refill. If patient does not wish to contact the pharmacy document the reason why and proceed with request.) (Agent: If yes, when and what did the pharmacy advise?) patient is unsure   This is the patient's preferred pharmacy:  [address], [place], [place] [address]  Phone [phone]  Is this the correct pharmacy for this prescription? Yes If no, delete pharmacy and type the correct one.   Has the prescription been filled recently? Yes  Is the patient out of the medication? Yes  Has the patient been seen for an appointment in the last year OR does the patient have an upcoming appointment? Yes  Can we respond through MyChart? No  Agent: Please be advised that Rx refills may take up to 3 business days. We ask that you follow-up with your pharmacy.

## 2024-04-09 ENCOUNTER — Other Ambulatory Visit: Payer: Self-pay

## 2024-04-09 ENCOUNTER — Encounter: Payer: Self-pay | Admitting: *Deleted

## 2024-04-09 ENCOUNTER — Ambulatory Visit: Payer: Self-pay | Admitting: Family Medicine

## 2024-04-09 DIAGNOSIS — I130 Hypertensive heart and chronic kidney disease with heart failure and stage 1 through stage 4 chronic kidney disease, or unspecified chronic kidney disease: Secondary | ICD-10-CM | POA: Diagnosis not present

## 2024-04-09 DIAGNOSIS — Z7901 Long term (current) use of anticoagulants: Secondary | ICD-10-CM | POA: Diagnosis not present

## 2024-04-09 DIAGNOSIS — N138 Other obstructive and reflux uropathy: Secondary | ICD-10-CM | POA: Diagnosis not present

## 2024-04-09 DIAGNOSIS — I480 Paroxysmal atrial fibrillation: Secondary | ICD-10-CM | POA: Diagnosis not present

## 2024-04-09 DIAGNOSIS — M47811 Spondylosis without myelopathy or radiculopathy, occipito-atlanto-axial region: Secondary | ICD-10-CM | POA: Diagnosis not present

## 2024-04-09 DIAGNOSIS — Z8673 Personal history of transient ischemic attack (TIA), and cerebral infarction without residual deficits: Secondary | ICD-10-CM | POA: Diagnosis not present

## 2024-04-09 DIAGNOSIS — I5023 Acute on chronic systolic (congestive) heart failure: Secondary | ICD-10-CM | POA: Diagnosis not present

## 2024-04-09 DIAGNOSIS — N1832 Chronic kidney disease, stage 3b: Secondary | ICD-10-CM | POA: Diagnosis not present

## 2024-04-09 DIAGNOSIS — Z9181 History of falling: Secondary | ICD-10-CM | POA: Diagnosis not present

## 2024-04-09 LAB — CBC WITH DIFFERENTIAL/PLATELET
Basophils Absolute: 0 K/uL (ref 0.0–0.1)
Basophils Relative: 0.4 % (ref 0.0–3.0)
Eosinophils Absolute: 0 K/uL (ref 0.0–0.7)
Eosinophils Relative: 0.8 % (ref 0.0–5.0)
HCT: 27.4 % — ABNORMAL LOW (ref 39.0–52.0)
Hemoglobin: 9 g/dL — ABNORMAL LOW (ref 13.0–17.0)
Lymphocytes Relative: 29.4 % (ref 12.0–46.0)
Lymphs Abs: 1.3 K/uL (ref 0.7–4.0)
MCHC: 32.9 g/dL (ref 30.0–36.0)
MCV: 78.8 fl (ref 78.0–100.0)
Monocytes Absolute: 0.9 K/uL (ref 0.1–1.0)
Monocytes Relative: 19.4 % — ABNORMAL HIGH (ref 3.0–12.0)
Neutro Abs: 2.2 K/uL (ref 1.4–7.7)
Neutrophils Relative %: 50 % (ref 43.0–77.0)
Platelets: 153 K/uL (ref 150.0–400.0)
RBC: 3.47 Mil/uL — ABNORMAL LOW (ref 4.22–5.81)
RDW: 17.4 % — ABNORMAL HIGH (ref 11.5–15.5)
WBC: 4.5 K/uL (ref 4.0–10.5)

## 2024-04-09 LAB — BASIC METABOLIC PANEL WITH GFR
BUN: 45 mg/dL — ABNORMAL HIGH (ref 6–23)
CO2: 24 meq/L (ref 19–32)
Calcium: 8.9 mg/dL (ref 8.4–10.5)
Chloride: 107 meq/L (ref 96–112)
Creatinine, Ser: 3.14 mg/dL — ABNORMAL HIGH (ref 0.40–1.50)
GFR: 16.82 mL/min — ABNORMAL LOW (ref >60.00)
Glucose, Bld: 94 mg/dL (ref 70–99)
Potassium: 4.8 meq/L (ref 3.5–5.1)
Sodium: 139 meq/L (ref 135–145)

## 2024-04-09 MED ORDER — EMPAGLIFLOZIN 10 MG PO TABS: 10.0000 mg | ORAL_TABLET | Freq: Every day | ORAL | Status: DC

## 2024-04-09 MED ORDER — EMPAGLIFLOZIN 10 MG PO TABS: 10.0000 mg | ORAL_TABLET | Freq: Every day | ORAL | 0 refills | Status: DC

## 2024-04-12 DIAGNOSIS — I4892 Unspecified atrial flutter: Secondary | ICD-10-CM | POA: Diagnosis not present

## 2024-04-12 DIAGNOSIS — N183 Chronic kidney disease, stage 3 unspecified: Secondary | ICD-10-CM | POA: Diagnosis not present

## 2024-04-12 DIAGNOSIS — E875 Hyperkalemia: Secondary | ICD-10-CM | POA: Diagnosis not present

## 2024-04-12 DIAGNOSIS — E785 Hyperlipidemia, unspecified: Secondary | ICD-10-CM | POA: Diagnosis not present

## 2024-04-12 DIAGNOSIS — I129 Hypertensive chronic kidney disease with stage 1 through stage 4 chronic kidney disease, or unspecified chronic kidney disease: Secondary | ICD-10-CM | POA: Diagnosis not present

## 2024-04-12 NOTE — Telephone Encounter (Signed)
 Copied from CRM [id number]. Topic: Clinical - Lab/Test Results >> Apr 12, 2024 10:17 AM Charlet HERO wrote: Reason for CRM: Patients daughter is returning call Trudy Davene CROME, [facility]    04/12/24 10:14 AM Result Note Left voicemail for patients daughter to return call to office.  Basic metabolic panel with GFR; CBC with Differential/Platelet

## 2024-04-13 ENCOUNTER — Telehealth: Payer: Self-pay | Admitting: Family Medicine

## 2024-04-13 DIAGNOSIS — N189 Chronic kidney disease, unspecified: Secondary | ICD-10-CM

## 2024-04-13 NOTE — Telephone Encounter (Signed)
 Copied from CRM [id number]. Topic: General - Other >> Apr 13, 2024 12:49 PM Aleatha C wrote: Reason for CRM: Patient went to the [facility] 7/21 and the Kidney doctor stop 2 of his medications, amLODipine  (NORVASC ) 5 MG tablet and spironolactone  (ALDACTONE ) 25 MG tablet and he wanted to confirm if you agree with that discussion

## 2024-04-14 ENCOUNTER — Ambulatory Visit
Admission: EM | Admit: 2024-04-14 | Discharge: 2024-04-14 | Disposition: A | Discharge status: Home / Self Care | Attending: Emergency Medicine | Admitting: Emergency Medicine

## 2024-04-14 ENCOUNTER — Ambulatory Visit: Payer: Self-pay | Admitting: *Deleted

## 2024-04-14 ENCOUNTER — Encounter: Payer: Self-pay | Admitting: Emergency Medicine

## 2024-04-14 DIAGNOSIS — Z7901 Long term (current) use of anticoagulants: Secondary | ICD-10-CM | POA: Diagnosis not present

## 2024-04-14 DIAGNOSIS — N138 Other obstructive and reflux uropathy: Secondary | ICD-10-CM | POA: Diagnosis not present

## 2024-04-14 DIAGNOSIS — I130 Hypertensive heart and chronic kidney disease with heart failure and stage 1 through stage 4 chronic kidney disease, or unspecified chronic kidney disease: Secondary | ICD-10-CM | POA: Diagnosis not present

## 2024-04-14 DIAGNOSIS — M47811 Spondylosis without myelopathy or radiculopathy, occipito-atlanto-axial region: Secondary | ICD-10-CM | POA: Diagnosis not present

## 2024-04-14 DIAGNOSIS — R531 Weakness: Secondary | ICD-10-CM | POA: Diagnosis not present

## 2024-04-14 DIAGNOSIS — I480 Paroxysmal atrial fibrillation: Secondary | ICD-10-CM | POA: Diagnosis not present

## 2024-04-14 DIAGNOSIS — Z9181 History of falling: Secondary | ICD-10-CM | POA: Diagnosis not present

## 2024-04-14 DIAGNOSIS — N1832 Chronic kidney disease, stage 3b: Secondary | ICD-10-CM | POA: Diagnosis not present

## 2024-04-14 DIAGNOSIS — R8290 Unspecified abnormal findings in urine: Secondary | ICD-10-CM | POA: Diagnosis not present

## 2024-04-14 DIAGNOSIS — I5023 Acute on chronic systolic (congestive) heart failure: Secondary | ICD-10-CM | POA: Diagnosis not present

## 2024-04-14 DIAGNOSIS — Z8673 Personal history of transient ischemic attack (TIA), and cerebral infarction without residual deficits: Secondary | ICD-10-CM | POA: Diagnosis not present

## 2024-04-14 LAB — POCT URINALYSIS DIP (MANUAL ENTRY)
Bilirubin, UA: NEGATIVE
Glucose, UA: 250 mg/dL — AB
Ketones, POC UA: NEGATIVE mg/dL
Nitrite, UA: NEGATIVE
Protein Ur, POC: >=300 mg/dL — AB
Spec Grav, UA: 1.02 (ref 1.010–1.025)
Urobilinogen, UA: 0.2 U/dL
pH, UA: 7 (ref 5.0–8.0)

## 2024-04-14 MED ORDER — NITROFURANTOIN MONOHYD MACRO 100 MG PO CAPS: 100.0000 mg | ORAL_CAPSULE | Freq: Two times a day (BID) | ORAL | 0 refills | Status: DC

## 2024-04-14 MED ORDER — NITROFURANTOIN MONOHYD MACRO 100 MG PO CAPS
100.0000 mg | ORAL_CAPSULE | Freq: Two times a day (BID) | ORAL | 0 refills | Status: AC
Start: 2024-04-14 — End: 2024-04-21

## 2024-04-14 NOTE — Telephone Encounter (Signed)
 second attempt: LVM for patient to call back to [phone]  and message routed to to [facility] at [facility]  Summary: Possible Infection   Daughter called in and stated that patient had a nurse visit and thinks he may have a possible infection. Daughter wants to speak with clinic and see if she could possibly bring her dad in for an appointment or should she take him to urgent care. [phone] BENNIE)

## 2024-04-14 NOTE — Telephone Encounter (Signed)
 Called patient to review possible signs of infection. No answer, LVMTCB [phone].    Summary: Possible Infection   Daughter called in and stated that patient had a nurse visit and thinks he may have a possible infection. Daughter wants to speak with clinic and see if she could possibly bring her dad in for an appointment or should she take him to urgent care. [phone] BENNIE)

## 2024-04-14 NOTE — Discharge Instructions (Addendum)
 You were evaluated for cloudy urine with an odor  Urinalysis shows Lander Eslick blood cells but does not show bacteria\, has been sent to the lab to see if bacteria will grow, you will be notified if this occurs  Begin Macrobid  twice daily for 7 days  I do believe weakness and fatigue were related to the blood pressure which is normal today  Please have home health nurse go ahead and change out urinary bag  Blood work checking electrolytes, kidney liver and blood levels are pending, you will be notified of any concerning values  Please schedule follow-up appointment with primary doctor

## 2024-04-14 NOTE — ED Triage Notes (Addendum)
 Patient complains of cloudy urine x 1 week.

## 2024-04-15 NOTE — Telephone Encounter (Signed)
 Per chart review tab pt went to [facility] on 04/14/24 and was given macrobid . Sending to [employer] as RICK.

## 2024-04-15 NOTE — ED Provider Notes (Signed)
 John Hines    CSN: [id number] Arrival date & time: 04/14/24  1851      History   Chief Complaint Chief Complaint  Patient presents with   Urinary Frequency    HPI John Hines is a 89 y.o. male.   Patient presents for evaluation of a strong foul urinary odor present over the past 7 days.  At the beginning of the week feeling dizziness and generalized weakness, was evaluated by his primary doctor noted blood pressure to be low when she medications were discontinued, symptoms improved the next day.  Has urinary catheter managed by patient and home health, changed every 2 weeks, last completed 1 week ago.  Denies hematuria, abdominal or flank pain or fevers.  Past Medical History:  Diagnosis Date   Arthritis    BPH (benign prostatic hyperplasia)    CHF (congestive heart failure) (HCC)    Nonischemic Cardiomyopathy. EF 25-35%   Chronic kidney disease 05/20/2013   CKD 3   Colon polyps    Dyslipidemia    Dysrhythmia 08/19/11   low heart beat; maybe 25bpm; got RX; now 60bpm   ED (erectile dysfunction)    Gout    Heart murmur    Hyperlipidemia    Hypertension    Low back pain 08/12/2013   Nonfunctioning kidney Recognized 03/2006   Chronically and Severely Hydronephrotic and Nonfunctioning Right Kidney   Solitary kidney Recognized 03/2006   Solitary Functioning Left Kidney   Stroke [facility])    mild stroke; dx'd 2010; don't know when it happened   Syncope and collapse 08/19/11   shallow breathing; sweating horribly    Patient Active Problem List   Diagnosis Date Noted   Healthcare maintenance 02/04/2024   Myalgia 07/02/2023   PAD (peripheral artery disease) (HCC) 12/12/2021   Medicare annual wellness visit, subsequent 12/12/2021   Knee pain 12/27/2020   Combined forms of age-related cataract of right eye 06/22/2020   Erectile dysfunction 06/21/2020   Hypothyroidism 06/21/2020   Paroxysmal A-fib (HCC) 08/18/2019   Atrial flutter (HCC) 06/26/2019    Advance care planning 10/05/2018   Iron deficiency anemia 08/07/2017   BPH (benign prostatic hyperplasia) 03/21/2016   Lower back pain 08/12/2013   Eczema 08/12/2013   Nonfunctioning kidney    CHF (congestive heart failure) (HCC)    Chronic systolic heart failure (HCC) 03/11/2013   Gout 03/11/2013   Chronic kidney disease, stage III (moderate) (HCC) 08/21/2011   Hyperglycemia 08/21/2011   Left bundle branch block 08/21/2011   Hyperlipidemia    Hypertension    Dyslipidemia     Past Surgical History:  Procedure Laterality Date   CARDIOVASCULAR STRESS TEST  08/30/2010   Small to moderate fixed inferoseptal defect. Severe global hypokinesis with inferior akinesis and septal dyskinesis-likely secondary to a prominent LBBB. Non-diagnostic for ischemia due to persantine.   KNEE ARTHROSCOPY  1996   right   TRANSTHORACIC ECHOCARDIOGRAM  08/20/2011   EF 45-50%, severe concentric hyperthrophy.       Home Medications    Prior to Admission medications   Medication Sig Start Date End Date Taking? Authorizing Provider  acetaminophen  (TYLENOL ) 500 MG tablet Take 1-2 tablets (500-1,000 mg total) by mouth every 8 (eight) hours as needed (for pain). 07/18/23   Cleatus Arlyss RAMAN, MD  amiodarone  (PACERONE ) 200 MG tablet TAKE ONE-HALF TABLET BY MOUTH  DAILY 08/25/23   Loistine Sober, NP  amLODipine  (NORVASC ) 5 MG tablet TAKE 1 TABLET BY MOUTH DAILY 09/15/23   Burnard Debby DELENA, MD  atorvastatin  (LIPITOR ) 80 MG tablet TAKE 1 TABLET BY MOUTH ONCE  DAILY 01/26/24   Burnard Debby LABOR, MD  carvedilol  (COREG ) 12.5 MG tablet TAKE 1 TABLET BY MOUTH TWICE  DAILY 01/19/24   Burnard Debby LABOR, MD  ELIQUIS  2.5 MG TABS tablet TAKE 1 TABLET BY MOUTH TWICE  DAILY 12/08/23   Cleatus Arlyss RAMAN, MD  empagliflozin  (JARDIANCE ) 10 MG TABS tablet Take 1 tablet (10 mg total) by mouth daily. 04/09/24   Cleatus Arlyss RAMAN, MD  ENTRESTO  97-103 MG TAKE 1 TABLET BY MOUTH TWICE  DAILY 02/10/23   Camnitz, Soyla Lunger, MD  ferrous sulfate   325 (65 FE) MG tablet Take 1 tablet (325 mg total) by mouth daily. 07/09/22   Cleatus Arlyss RAMAN, MD  furosemide  (LASIX ) 20 MG tablet Take 20 mg daily.  If increase in swelling, temporarily increase to 20 mg twice a day. 03/07/24   Cleatus Arlyss RAMAN, MD  levothyroxine  (SYNTHROID ) 75 MCG tablet Take 1 tablet (75 mcg total) by mouth daily before breakfast. 02/03/24   Cleatus Arlyss RAMAN, MD  nitrofurantoin , macrocrystal-monohydrate, (MACROBID ) 100 MG capsule Take 1 capsule (100 mg total) by mouth 2 (two) times daily for 7 days. 04/14/24 04/21/24  Teresa Shelba SAUNDERS, NP  sodium zirconium cyclosilicate  (LOKELMA ) 5 g packet Take 5 g by mouth 2 (two) times a week. 02/12/24   Cleatus Arlyss RAMAN, MD  spironolactone  (ALDACTONE ) 25 MG tablet TAKE ONE-HALF TABLET BY MOUTH  DAILY 01/19/24   Leverne Charlies Helling, PA-C  tamsulosin  (FLOMAX ) 0.4 MG CAPS capsule Take 0.4 mg by mouth. 03/02/24 03/02/25  [provider]    Family History Family History  Problem Relation Age of Onset   Early death Mother        died in child birth   Stomach cancer Father        stomach    Social History Social History   Tobacco Use   Smoking status: Never   Smokeless tobacco: Never  Vaping Use   Vaping status: Never Used  Substance Use Topics   Alcohol use: Yes    Comment: occasional   Drug use: No     Allergies   Lisinopril, Molnupiravir , Nsaids, and Penicillins   Review of Systems Review of Systems  Genitourinary:  Positive for frequency.     Physical Exam Triage Vital Signs ED Triage Vitals  Encounter Vitals Group     BP 04/14/24 2028 138/82     Girls Systolic BP Percentile --      Girls Diastolic BP Percentile --      Boys Systolic BP Percentile --      Boys Diastolic BP Percentile --      Pulse Rate 04/14/24 2028 85     Resp 04/14/24 2028 20     Temp 04/14/24 2028 98.5 F (36.9 C)     Temp Source 04/14/24 2028 Oral     SpO2 04/14/24 2028 96 %     Weight --      Height --      Head Circumference --       Peak Flow --      Pain Score 04/14/24 2005 0     Pain Loc --      Pain Education --      Exclude from Growth Chart --    No data found.  Updated Vital Signs BP 138/82 (BP Location: Left Arm)   Pulse 85   Temp 98.5 F (36.9 C) (Oral)   Resp 20  SpO2 96%   Visual Acuity Right Eye Distance:   Left Eye Distance:   Bilateral Distance:    Right Eye Near:   Left Eye Near:    Bilateral Near:     Physical Exam Constitutional:      Appearance: Normal appearance.  Eyes:     Extraocular Movements: Extraocular movements intact.  Pulmonary:     Effort: Pulmonary effort is normal.  Abdominal:     Tenderness: There is no abdominal tenderness. There is no right CVA tenderness, left CVA tenderness or guarding.  Genitourinary:    Comments: No drainage or leaking at catheter insertion site of the urethra Neurological:     Mental Status: He is alert and oriented to person, place, and time. Mental status is at baseline.      UC Treatments / Results  Labs (all labs ordered are listed, but only abnormal results are displayed) Labs Reviewed  POCT URINALYSIS DIP (MANUAL ENTRY) - Abnormal; Notable for the following components:      Result Value   Clarity, UA cloudy (*)    Glucose, UA =250 (*)    Blood, UA large (*)    Protein Ur, POC >=300 (*)    Leukocytes, UA Large (3+) (*)    All other components within normal limits  URINE CULTURE  COMPREHENSIVE METABOLIC PANEL WITH GFR  CBC    EKG   Radiology No results found.  Procedures Procedures (including critical care time)  Medications Ordered in UC Medications - No data to display  Initial Impression / Assessment and Plan / UC Course  I have reviewed the triage vital signs and the nursing notes.  Pertinent labs & imaging results that were available during my care of the patient were reviewed by me and considered in my medical decision making (see chart for details).  Generalized weakness, cloudy urine  Urinalysis  showing hemoglobin and Kodi Guerrera blood cells, negative for nitrates, foul urinary odor noted in the clinic empirically placed on Macrobid  and discussed administration, recommended supportive care, CBC and CMP pending, will have patient follow-up with primary doctor for any concerning values, may follow-up as needed Final Clinical Impressions(s) / UC Diagnoses   Final diagnoses:  Generalized weakness  Cloudy urine     Discharge Instructions      You were evaluated for cloudy urine with an odor  Urinalysis shows Maxi Carreras blood cells but does not show bacteria\, has been sent to the lab to see if bacteria will grow, you will be notified if this occurs  Begin Macrobid  twice daily for 7 days  I do believe weakness and fatigue were related to the blood pressure which is normal today  Please have home health nurse go ahead and change out urinary bag  Blood work checking electrolytes, kidney liver and blood levels are pending, you will be notified of any concerning values  Please schedule follow-up appointment with primary doctor   ED Prescriptions     Medication Sig Dispense Auth. Provider   nitrofurantoin , macrocrystal-monohydrate, (MACROBID ) 100 MG capsule  (Status: Discontinued) Take 1 capsule (100 mg total) by mouth 2 (two) times daily for 7 days. 14 capsule Requan Hardge R, NP   nitrofurantoin , macrocrystal-monohydrate, (MACROBID ) 100 MG capsule Take 1 capsule (100 mg total) by mouth 2 (two) times daily for 7 days. 14 capsule Gleb Mcguire, Shelba SAUNDERS, NP      PDMP not reviewed this encounter.   Teresa Shelba SAUNDERS, [place] 04/15/24 [phone]

## 2024-04-15 NOTE — Telephone Encounter (Signed)
 Please reach out to patients daughter Raymon to check status of patient. [phone]

## 2024-04-16 ENCOUNTER — Ambulatory Visit (HOSPITAL_COMMUNITY): Payer: Self-pay

## 2024-04-16 LAB — COMPREHENSIVE METABOLIC PANEL WITH GFR
ALT: 9 IU/L (ref 0–44)
AST: 14 IU/L (ref 0–40)
Albumin: 4 g/dL (ref 3.6–4.6)
Alkaline Phosphatase: 127 IU/L — ABNORMAL HIGH (ref 44–121)
BUN/Creatinine Ratio: 14 (ref 10–24)
BUN: 39 mg/dL — ABNORMAL HIGH (ref 10–36)
Bilirubin Total: 0.3 mg/dL (ref 0.0–1.2)
CO2: 16 mmol/L — ABNORMAL LOW (ref 20–29)
Calcium: 9.1 mg/dL (ref 8.6–10.2)
Chloride: 108 mmol/L — ABNORMAL HIGH (ref 96–106)
Creatinine, Ser: 2.8 mg/dL — ABNORMAL HIGH (ref 0.76–1.27)
Globulin, Total: 2.9 g/dL (ref 1.5–4.5)
Glucose: 66 mg/dL — ABNORMAL LOW (ref 70–99)
Potassium: 5.2 mmol/L (ref 3.5–5.2)
Sodium: 141 mmol/L (ref 134–144)
Total Protein: 6.9 g/dL (ref 6.0–8.5)
eGFR: 21 mL/min/1.73 — ABNORMAL LOW (ref >59)

## 2024-04-16 LAB — CBC

## 2024-04-17 LAB — URINE CULTURE: Culture: >=100000 — AB

## 2024-04-18 NOTE — Telephone Encounter (Signed)
 Please get update on patient regarding status now that he is on antibiotics.  Thanks.  See other phone note/result note.

## 2024-04-18 NOTE — Telephone Encounter (Signed)
 I agree with that, especially if his blood pressure has been lower.  Please request a copy of the renal clinic notes.  Thanks.

## 2024-04-21 ENCOUNTER — Encounter: Payer: Self-pay | Admitting: Family Medicine

## 2024-04-21 DIAGNOSIS — I130 Hypertensive heart and chronic kidney disease with heart failure and stage 1 through stage 4 chronic kidney disease, or unspecified chronic kidney disease: Secondary | ICD-10-CM | POA: Diagnosis not present

## 2024-04-21 DIAGNOSIS — M47811 Spondylosis without myelopathy or radiculopathy, occipito-atlanto-axial region: Secondary | ICD-10-CM | POA: Diagnosis not present

## 2024-04-21 DIAGNOSIS — Z9181 History of falling: Secondary | ICD-10-CM | POA: Diagnosis not present

## 2024-04-21 DIAGNOSIS — N138 Other obstructive and reflux uropathy: Secondary | ICD-10-CM | POA: Diagnosis not present

## 2024-04-21 DIAGNOSIS — N1832 Chronic kidney disease, stage 3b: Secondary | ICD-10-CM | POA: Diagnosis not present

## 2024-04-21 DIAGNOSIS — I480 Paroxysmal atrial fibrillation: Secondary | ICD-10-CM | POA: Diagnosis not present

## 2024-04-21 DIAGNOSIS — Z8673 Personal history of transient ischemic attack (TIA), and cerebral infarction without residual deficits: Secondary | ICD-10-CM | POA: Diagnosis not present

## 2024-04-21 DIAGNOSIS — I5023 Acute on chronic systolic (congestive) heart failure: Secondary | ICD-10-CM | POA: Diagnosis not present

## 2024-04-21 DIAGNOSIS — Z7901 Long term (current) use of anticoagulants: Secondary | ICD-10-CM | POA: Diagnosis not present

## 2024-04-21 NOTE — Addendum Note (Signed)
 Addended by: CLEATUS ARLYSS RAMAN on: 04/21/2024 06:17 PM   Modules accepted: Orders

## 2024-04-21 NOTE — Telephone Encounter (Signed)
 Closing encounter. Duplicate.

## 2024-04-21 NOTE — Telephone Encounter (Signed)
 Records have been been requested. I spoke with patient and his daughter and his BP is doing good. Today is was 128/68. His daughter states that he looks a lot better in the face and that he has been moving around pretty good. When they went to see Dr. Katheryn Saba that they were told that he needed to do a renal panel just to check for infections. They are requesting that the panel be done here. I did confirm with [facility] that this was the only order. Can you place the order so that they can come here(patient request) and we just send [facility] the results.

## 2024-04-21 NOTE — Telephone Encounter (Signed)
 Please talk to me about this.  I put in a follow up renal panel (ie Cr, etc), but this isn't checking for infection (meaning this isn't a urine culture).  Please verify that is the desired panel.  Thanks.

## 2024-04-22 NOTE — Telephone Encounter (Signed)
 Received orders from [facility]. Discussed with Dr. Cleatus in clinic. Scheduled patient for lab appointment on 04/29/23

## 2024-04-23 ENCOUNTER — Telehealth: Payer: Self-pay

## 2024-04-23 NOTE — Telephone Encounter (Signed)
 Patient was identified as falling into the [place] Measure - Diabetes.   Patient was: Attribution and/or data issue.  Validation/Investigation needed.  Explanation:  Patient does not have diagnosis of diabetes.

## 2024-04-28 ENCOUNTER — Telehealth: Payer: Self-pay | Admitting: Family Medicine

## 2024-04-28 ENCOUNTER — Ambulatory Visit: Payer: Self-pay | Admitting: Family Medicine

## 2024-04-28 ENCOUNTER — Other Ambulatory Visit (INDEPENDENT_AMBULATORY_CARE_PROVIDER_SITE_OTHER)

## 2024-04-28 DIAGNOSIS — N189 Chronic kidney disease, unspecified: Secondary | ICD-10-CM

## 2024-04-28 LAB — RENAL FUNCTION PANEL
Albumin: 4 g/dL (ref 3.5–5.2)
BUN: 24 mg/dL — ABNORMAL HIGH (ref 6–23)
CO2: 26 meq/L (ref 19–32)
Calcium: 8.7 mg/dL (ref 8.4–10.5)
Chloride: 111 meq/L (ref 96–112)
Creatinine, Ser: 2.28 mg/dL — ABNORMAL HIGH (ref 0.40–1.50)
GFR: 24.68 mL/min — ABNORMAL LOW (ref >60.00)
Glucose, Bld: 103 mg/dL — ABNORMAL HIGH (ref 70–99)
Phosphorus: 3.3 mg/dL (ref 2.3–4.6)
Potassium: 4.1 meq/L (ref 3.5–5.1)
Sodium: 142 meq/L (ref 135–145)

## 2024-04-28 NOTE — Telephone Encounter (Signed)
 Error

## 2024-04-29 ENCOUNTER — Other Ambulatory Visit: Payer: Self-pay | Admitting: Student

## 2024-04-29 DIAGNOSIS — Z9181 History of falling: Secondary | ICD-10-CM | POA: Diagnosis not present

## 2024-04-29 DIAGNOSIS — I480 Paroxysmal atrial fibrillation: Secondary | ICD-10-CM | POA: Diagnosis not present

## 2024-04-29 DIAGNOSIS — Z8673 Personal history of transient ischemic attack (TIA), and cerebral infarction without residual deficits: Secondary | ICD-10-CM | POA: Diagnosis not present

## 2024-04-29 DIAGNOSIS — I130 Hypertensive heart and chronic kidney disease with heart failure and stage 1 through stage 4 chronic kidney disease, or unspecified chronic kidney disease: Secondary | ICD-10-CM | POA: Diagnosis not present

## 2024-04-29 DIAGNOSIS — N138 Other obstructive and reflux uropathy: Secondary | ICD-10-CM | POA: Diagnosis not present

## 2024-04-29 DIAGNOSIS — N1832 Chronic kidney disease, stage 3b: Secondary | ICD-10-CM | POA: Diagnosis not present

## 2024-04-29 DIAGNOSIS — M47811 Spondylosis without myelopathy or radiculopathy, occipito-atlanto-axial region: Secondary | ICD-10-CM | POA: Diagnosis not present

## 2024-04-29 DIAGNOSIS — Z7901 Long term (current) use of anticoagulants: Secondary | ICD-10-CM | POA: Diagnosis not present

## 2024-04-29 DIAGNOSIS — I5023 Acute on chronic systolic (congestive) heart failure: Secondary | ICD-10-CM | POA: Diagnosis not present

## 2024-05-03 ENCOUNTER — Ambulatory Visit: Payer: Self-pay

## 2024-05-03 NOTE — Telephone Encounter (Signed)
 Noted. Thanks.

## 2024-05-03 NOTE — Telephone Encounter (Signed)
 FYI Only or Action Required?: Action required by provider: request for appointment.  Patient was last seen in primary care on 03/05/2024 by Cleatus Arlyss RAMAN, MD.  Called Nurse Triage reporting No chief complaint on file..  Symptoms began several days ago.  Interventions attempted: Rest, hydration, or home remedies.  Symptoms are: unchanged.  Triage Disposition: See Physician Within 24 Hours  Patient/caregiver understands and will follow disposition?: Yes   Offered appt for today, but pt only wants to see Dr. Cleatus  Copied from CRM [id number]. Topic: Clinical - Red Word Triage >> May 03, 2024  9:34 AM Thersia BROCKS wrote: [facility] that prompted transfer to Nurse Triage: Patient daughter, Raymon calls in stating patient had a infection, a UTI and she doesn't think it is clearing up wanted to speak to a nurse about it Reason for Disposition  Foul-smelling urine (urine smells bad)  Answer Assessment - Initial Assessment Questions 1. SYMPTOMS: What symptoms are you concerned about?     odor 2. ONSET:  When did the symptoms start?     2 days 3. FEVER: Do you have a fever? If Yes, ask: What is the temperature, how was it measured, and when did it start?     no 4. ABDOMEN PAIN: Is there any abdomen pain? (Scale 1-10; or mild, moderate, severe)     no 5. URINE COLOR: What color is the urine?  Is there blood present in the urine? (e.g., clear, yellow, cloudy, tea-colored, blood streaks, bright red)     unknown 6. URINE AMOUNT: When did you last empty the urine from the collection bag? How much urine was in the bag at that time? How much urine is in the collection bag now?     Pt empties his bag  7. INSERTION: How long have you (they) had the catheter?     3 weeks  8. OTHER SYMPTOMS: Are there any other symptoms? (e.g., abdomen swelling, back pain, bladder spasms, constipation, foul smelling urine, leaking of urine)      Eyes look weak 9. MEDICINES: Are you taking any  medicines to treat urinary problems? (e.g., antibiotics for a urinary tract infection, medicines to treat bladder spasms)      flomax   Protocols used: Urinary Catheter (e.g., Foley) Symptoms and Questions-A-AH

## 2024-05-03 NOTE — Telephone Encounter (Signed)
 I spoke with John Hines;DPR signed) John Hines said that pt has hx of UTI; for2 days pt has bad smelling urine and his eyes look weak; John Hines said pts eyes looking weak means he looks really tired and pt told John Hines  he feels tired.John Hines has not actually seen urine;pt told John Hines his urine looks OK; no blood and no sediment in bag; John Hines said pt told her he is urinating a good amt??pt has had catheter for 3 wks. Pt is not having any abd pain, no fever, and no confusion.John Hines said she would not know anything was wrong with her father if his eyes did not look weak. John Hines said pt does not need to go to UC or ED at this time. Pt is not presently taking abx and John Hines hopes pt took his abx correctly last time. Pt living with his son but John Hines is coming to appt with ONEIDA Patrick FNP on 05/04/24 at 10:20.John Hines said pt is drinking and staying hydrated. John Hines spoke with urologist today but John Hines said they did not comment on when pt should be seen.UC & ED precautions given to John Hines and she voiced understanding. John Hines said pt is not as bad as last time.Sending note to Dr Cleatus and ONEIDA Patrick FNP.

## 2024-05-03 NOTE — Telephone Encounter (Signed)
 Leta called back and is speaking with AV CMA.

## 2024-05-03 NOTE — Telephone Encounter (Signed)
 Agree with Dr. Elfredia concerns as note states 'eyes look weak'

## 2024-05-03 NOTE — Telephone Encounter (Signed)
 Left message for patients daughter Raymon to return call

## 2024-05-03 NOTE — Telephone Encounter (Signed)
 Spoke with patient and daughter John Hines. No fever, no pain. Patient states that his urine is clear and almost look like water. He states for example that if he drinks Gatorade then his urine will look like the color Gatorade. But other than that no emergent issue that can't wait until his visit tomorrow with [name].

## 2024-05-03 NOTE — Telephone Encounter (Signed)
 Does the patient have a fever?  Does he have escalating symptoms that need to be addressed prior to schedule OV tomorrow?  Please let me know.  Thanks.

## 2024-05-04 ENCOUNTER — Encounter: Payer: Self-pay | Admitting: Family

## 2024-05-04 ENCOUNTER — Ambulatory Visit: Payer: Self-pay | Admitting: Family

## 2024-05-04 ENCOUNTER — Ambulatory Visit (INDEPENDENT_AMBULATORY_CARE_PROVIDER_SITE_OTHER): Admitting: Family

## 2024-05-04 ENCOUNTER — Telehealth: Payer: Self-pay | Admitting: Family Medicine

## 2024-05-04 ENCOUNTER — Telehealth: Payer: Self-pay | Admitting: Family

## 2024-05-04 VITALS — BP 132/64 | HR 66 | Temp 98.6°F | Wt 165.4 lb

## 2024-05-04 DIAGNOSIS — R399 Unspecified symptoms and signs involving the genitourinary system: Secondary | ICD-10-CM

## 2024-05-04 DIAGNOSIS — R600 Localized edema: Secondary | ICD-10-CM | POA: Diagnosis not present

## 2024-05-04 DIAGNOSIS — N184 Chronic kidney disease, stage 4 (severe): Secondary | ICD-10-CM | POA: Diagnosis not present

## 2024-05-04 DIAGNOSIS — R82998 Other abnormal findings in urine: Secondary | ICD-10-CM | POA: Diagnosis not present

## 2024-05-04 DIAGNOSIS — R8290 Unspecified abnormal findings in urine: Secondary | ICD-10-CM | POA: Insufficient documentation

## 2024-05-04 DIAGNOSIS — I509 Heart failure, unspecified: Secondary | ICD-10-CM | POA: Diagnosis not present

## 2024-05-04 DIAGNOSIS — Z9189 Other specified personal risk factors, not elsewhere classified: Secondary | ICD-10-CM | POA: Diagnosis not present

## 2024-05-04 LAB — CBC WITH DIFFERENTIAL/PLATELET
Basophils Absolute: 0 K/uL (ref 0.0–0.1)
Basophils Relative: 0.4 % (ref 0.0–3.0)
Eosinophils Absolute: 0 K/uL (ref 0.0–0.7)
Eosinophils Relative: 0.4 % (ref 0.0–5.0)
HCT: 30.6 % — ABNORMAL LOW (ref 39.0–52.0)
Hemoglobin: 9.9 g/dL — ABNORMAL LOW (ref 13.0–17.0)
Lymphocytes Relative: 33 % (ref 12.0–46.0)
Lymphs Abs: 1.2 K/uL (ref 0.7–4.0)
MCHC: 32.2 g/dL (ref 30.0–36.0)
MCV: 79.3 fl (ref 78.0–100.0)
Monocytes Absolute: 0.8 K/uL (ref 0.1–1.0)
Monocytes Relative: 21.4 % — ABNORMAL HIGH (ref 3.0–12.0)
Neutro Abs: 1.7 K/uL (ref 1.4–7.7)
Neutrophils Relative %: 44.8 % (ref 43.0–77.0)
Platelets: 176 K/uL (ref 150.0–400.0)
RBC: 3.86 Mil/uL — ABNORMAL LOW (ref 4.22–5.81)
RDW: 18.3 % — ABNORMAL HIGH (ref 11.5–15.5)
WBC: 3.7 K/uL — ABNORMAL LOW (ref 4.0–10.5)

## 2024-05-04 LAB — POCT URINALYSIS DIP (CLINITEK)
Bilirubin, UA: NEGATIVE
Glucose, UA: NEGATIVE mg/dL
Ketones, POC UA: NEGATIVE mg/dL
Nitrite, UA: NEGATIVE
Spec Grav, UA: 1.01 (ref 1.010–1.025)
Urobilinogen, UA: 0.2 U/dL
pH, UA: 6 (ref 5.0–8.0)

## 2024-05-04 LAB — URINALYSIS, ROUTINE W REFLEX MICROSCOPIC
Ketones, ur: NEGATIVE
Nitrite: NEGATIVE
Specific Gravity, Urine: 1.02 (ref 1.000–1.030)
Total Protein, Urine: >=300 — AB
Urine Glucose: 250 — AB
Urobilinogen, UA: 1 (ref 0.0–1.0)
pH: 7.5 (ref 5.0–8.0)

## 2024-05-04 LAB — BASIC METABOLIC PANEL WITH GFR
BUN: 21 mg/dL (ref 6–23)
CO2: 28 meq/L (ref 19–32)
Calcium: 8.6 mg/dL (ref 8.4–10.5)
Chloride: 108 meq/L (ref 96–112)
Creatinine, Ser: 2.19 mg/dL — ABNORMAL HIGH (ref 0.40–1.50)
GFR: 25.9 mL/min — ABNORMAL LOW (ref >60.00)
Glucose, Bld: 102 mg/dL — ABNORMAL HIGH (ref 70–99)
Potassium: 5 meq/L (ref 3.5–5.1)
Sodium: 142 meq/L (ref 135–145)

## 2024-05-04 LAB — BRAIN NATRIURETIC PEPTIDE: Pro B Natriuretic peptide (BNP): 141 pg/mL — ABNORMAL HIGH (ref 0.0–100.0)

## 2024-05-04 MED ORDER — FOSFOMYCIN TROMETHAMINE 3 G PO PACK
3.0000 g | PACK | Freq: Once | ORAL | 0 refills | Status: AC
Start: 2024-05-04 — End: 2024-05-04

## 2024-05-04 NOTE — Telephone Encounter (Signed)
 Call pt  I spoke with John Hines We are going to give him fosfomycin for one dose  Call urology asap as they should have the catheter changed out.  Let us  know if you have trouble getting in by today or tomorrow

## 2024-05-04 NOTE — Telephone Encounter (Signed)
 Copied from CRM [id number]. Topic: Clinical - Medical Advice >> May 04, 2024  4:51 PM Tiffini S wrote: Reason for CRM: Patient daughter Raymon stating that the patient was seen today with [place], [name] M and she have some questions about today's visit. Please call at [phone].

## 2024-05-04 NOTE — Telephone Encounter (Signed)
 NOTED

## 2024-05-04 NOTE — Progress Notes (Signed)
 Established Patient Office Visit  Subjective:      CC:  Chief Complaint  Patient presents with   Acute Visit    Foul odor to urine, possible UTI?    HPI: John Hines is a 89 y.o. male presenting on 05/04/2024 for Acute Visit (Foul odor to urine, possible UTI?) .  Discussed the use of AI scribe software for clinical note transcription with the patient, who gave verbal consent to proceed.  History of Present Illness John Hines is a 89 year old male with chronic kidney disease and urinary retention who presents with concerns of a possible urinary tract infection. He is accompanied by his daughter, who is his primary caregiver.  He has a history of chronic kidney disease, with an atrophic right kidney and left hydronephrosis. He uses a catheter for urinary retention and has been experiencing urinary odor, which was previously associated with a UTI. His daughter noticed a similar odor recently. He was previously treated with Macrobid  for a UTI, which was effective at the time.  He has a history of urinary retention and is currently using a catheter inserted through the penis. He was previously on Flomax  (tamsulosin ) for urinary retention but is no longer taking it. No burning during urination. The bag is full every morning.  He has chronic kidney disease with an atrophic right kidney and left hydronephrosis. He was previously on Lasix  but discontinued it per nephrology due to concerns about kidney function. He is not currently seeing a nephrologist as his previous kidney doctor retired in May. Needs a new referral   He has a history of congestive heart failure and reports swelling in his legs, which worsens for long periods of standingt. He was previously on spironolactone  and amlodipine , but these were stopped due to low blood pressure. He monitors his weight daily and reports no significant changes. No increased shortness of breath, cough, or fever, although a low-grade fever  was noted by his daughter recently.  He has a known allergy to penicillin, which causes a rash and swelling. He has not experienced shortness of breath or throat swelling with this allergy. He is cautious about medication use due to his kidney function and allergies.         Social history:  Relevant past medical, surgical, family and social history reviewed and updated as indicated. Interim medical history since our last visit reviewed.  Allergies and medications reviewed and updated.  DATA REVIEWED: CHART IN EPIC     ROS: Negative unless specifically indicated above in HPI.    Current Outpatient Medications:    acetaminophen  (TYLENOL ) 500 MG tablet, Take 1-2 tablets (500-1,000 mg total) by mouth every 8 (eight) hours as needed (for pain)., Disp: , Rfl:    amiodarone  (PACERONE ) 200 MG tablet, TAKE ONE-HALF TABLET BY MOUTH  DAILY, Disp: 50 tablet, Rfl: 2   atorvastatin  (LIPITOR ) 80 MG tablet, TAKE 1 TABLET BY MOUTH ONCE  DAILY, Disp: 90 tablet, Rfl: 2   carvedilol  (COREG ) 12.5 MG tablet, TAKE 1 TABLET BY MOUTH TWICE  DAILY, Disp: 180 tablet, Rfl: 3   ELIQUIS  2.5 MG TABS tablet, TAKE 1 TABLET BY MOUTH TWICE  DAILY, Disp: 200 tablet, Rfl: 2   empagliflozin  (JARDIANCE ) 10 MG TABS tablet, Take 1 tablet (10 mg total) by mouth daily., Disp: 90 tablet, Rfl: 0   ENTRESTO  97-103 MG, TAKE 1 TABLET BY MOUTH TWICE  DAILY, Disp: 180 tablet, Rfl: 3   ferrous sulfate  325 (65 FE) MG tablet, Take  1 tablet (325 mg total) by mouth daily., Disp: 90 tablet, Rfl: 1   fosfomycin (MONUROL ) 3 g PACK, Take 3 g by mouth once for 1 dose., Disp: 3 g, Rfl: 0   levothyroxine  (SYNTHROID ) 75 MCG tablet, Take 1 tablet (75 mcg total) by mouth daily before breakfast., Disp: 90 tablet, Rfl: 3   sodium zirconium cyclosilicate  (LOKELMA ) 5 g packet, Take 5 g by mouth 2 (two) times a week., Disp: , Rfl:    tamsulosin  (FLOMAX ) 0.4 MG CAPS capsule, Take 0.4 mg by mouth., Disp: , Rfl:         Objective:         BP 132/64 (BP Location: Right Arm, Patient Position: Sitting, Cuff Size: Normal)   Pulse 66   Temp 98.6 F (37 C) (Temporal)   Wt 165 lb 6.4 oz (75 kg)   SpO2 98%   BMI 26.26 kg/m   Physical Exam VITALS: T- 98.0, BP- 132/64 MEASUREMENTS: Weight- 160. CHEST: Lungs clear to auscultation bilaterally. CARDIOVASCULAR: Regular rate and rhythm, no murmurs. ABDOMEN: No abdominal or flank tenderness. GENITOURINARY: No penile redness, discharge, or swelling. EXTREMITIES: 1+ pitting edema in lower extremities.  Wt Readings from Last 3 Encounters:  05/04/24 165 lb 6.4 oz (75 kg)  03/05/24 164 lb (74.4 kg)  02/03/24 166 lb 3.2 oz (75.4 kg)    Physical Exam Constitutional:      General: He is not in acute distress.    Appearance: Normal appearance. He is normal weight. He is not ill-appearing, toxic-appearing or diaphoretic.  Cardiovascular:     Rate and Rhythm: Normal rate.  Pulmonary:     Effort: Pulmonary effort is normal.  Abdominal:     Palpations: Abdomen is soft.     Tenderness: There is no right CVA tenderness or left CVA tenderness.     Comments: No pelvic or lower abdominal tenderness  Genitourinary:    Testes:        Right: Swelling not present.        Left: Swelling not present.     Comments: Foley cathter present inserted in penis without accompany discharge, redness, or tenderness Urine visualization in catheter bag light yellow no sediment or blood visualized grossly   Musculoskeletal:        General: Normal range of motion.  Neurological:     General: No focal deficit present.     Mental Status: He is alert and oriented to person, place, and time. Mental status is at baseline.  Psychiatric:        Mood and Affect: Mood normal.        Behavior: Behavior normal.        Thought Content: Thought content normal.        Judgment: Judgment normal.          Results LABS   Urinalysis: WBC, bacteria, blood, protein present   Urine culture: Enterococcus,  susceptible to nitrofurantoin  (04/14/2024)  Assessment & Plan:   Assessment and Plan Assessment & Plan Recurrent urinary tract infection with indwelling catheter Recurrent UTI with indwelling catheter. Symptoms include fever, urinary odor, and pyuria. Previous treatment with nitrofurantoin  was limited by kidney function and penicillin allergy. Current urinalysis shows pyuria, bacteriuria, hematuria, and proteinuria, indicating potential UTI. Risk increased due to indwelling catheter and CKD. - Order urine culture to confirm UTI and guide antibiotic therapy - Initiate empirical antibiotic treatment with fosamycin 3 g x one dose no renal dosing change necessary per up to date, pending urine culture results -  Consult with urologist regarding UTI management and likely will need to change out catheter due to recurrent infection  - Monitor for signs of worsening infection or kidney involvement  Chronic kidney disease stage 4 with left hydronephrosis and atrophic right kidney CKD stage 4 with left hydronephrosis and atrophic right kidney. Recent labs and imaging indicate decreased kidney function. Previous nephrologist retired, necessitating new nephrology care. Current management includes monitoring kidney function and avoiding nephrotoxic medications. - Refer to nephrology for ongoing management of CKD - Repeat kidney function tests to monitor disease progression - Avoid nephrotoxic medications, including furosemide   Congestive heart failure Congestive heart failure with lower extremity edema. No increased dyspnea or significant weight gain. Blood pressure is well-managed. Previous medications, spironolactone  and amlodipine , were discontinued due to hypotension. Discussed fluid retention management, compression stockings, and dietary modifications. - Order BNP test to assess for heart failure exacerbation although less likely  - Encourage use of compression stockings to manage edema - Advise on  dietary salt restriction and fluid management - Elevate legs at night to reduce edema  Lower extremity edema Lower extremity edema, likely related to congestive heart failure and CKD. Edema is pitting and worsens with activity. Previous use of furosemide  was discontinued due to kidney concerns. - Encourage use of compression stockings to manage edema - Advise on dietary salt restriction and fluid management - Elevate legs at night to reduce edema  Hypertension Hypertension managed with Entresto  and carvedilol . Previous medications, spironolactone  and amlodipine , were discontinued due to hypotension. Blood pressure is well-managed at 132/64 mmHg. - Continue current antihypertensive regimen with Entresto  and carvedilol  - Monitor blood pressure regularly to ensure stability  Recording duration: 30 minutes      Return if symptoms worsen or fail to improve.     Ginger Patrick, MSN, APRN, FNP-[facility]

## 2024-05-04 NOTE — Telephone Encounter (Signed)
 Spoke with pt's daughter, Leta. She is aware of [name]'s message. States that she has already called Urology for an appointment and is waiting to hear back.

## 2024-05-05 ENCOUNTER — Telehealth: Payer: Self-pay

## 2024-05-05 DIAGNOSIS — R338 Other retention of urine: Secondary | ICD-10-CM | POA: Diagnosis not present

## 2024-05-05 NOTE — Telephone Encounter (Signed)
 Spoke with patients daughter Raymon who is on the [facility] and advised her of Dr. Elfredia comments. She will update us  in a few days

## 2024-05-05 NOTE — Telephone Encounter (Signed)
 I wouldn't change meds based on 1 reading.  I would check BP periodically when at rest and update us  with more readings in the next few days.  Thanks.

## 2024-05-05 NOTE — Telephone Encounter (Signed)
 Spoke with pt's daughter, Leta. She did not have any questions about the pt's visit yesterday. Leta just wanted to let us  know that he will be getting his cathter replaced today. Nothing further was needed.

## 2024-05-05 NOTE — Telephone Encounter (Signed)
 Copied from CRM [id number]. Topic: General - Other >> May 05, 2024 10:19 AM Burnard DEL wrote: Reason for CRM: Patients daughter Raymon Childes is requesting  a phone call to discuss blood pressure. She stated that his blood went up to 171/74. She wanted to know about him starting back on one of hs blood pressure medications [phone]

## 2024-05-07 LAB — URINE CULTURE
MICRO NUMBER:: 16819883
SPECIMEN QUALITY:: ADEQUATE

## 2024-05-10 ENCOUNTER — Ambulatory Visit: Payer: Self-pay

## 2024-05-10 ENCOUNTER — Encounter: Payer: Self-pay | Admitting: Internal Medicine

## 2024-05-10 ENCOUNTER — Ambulatory Visit (INDEPENDENT_AMBULATORY_CARE_PROVIDER_SITE_OTHER): Admitting: Internal Medicine

## 2024-05-10 VITALS — BP 132/74 | HR 61 | Temp 98.1°F | Ht 66.5 in | Wt 169.0 lb

## 2024-05-10 DIAGNOSIS — I480 Paroxysmal atrial fibrillation: Secondary | ICD-10-CM | POA: Diagnosis not present

## 2024-05-10 DIAGNOSIS — I10 Essential (primary) hypertension: Secondary | ICD-10-CM | POA: Diagnosis not present

## 2024-05-10 NOTE — Assessment & Plan Note (Signed)
 BP Readings from Last 3 Encounters:  05/10/24 132/74  05/04/24 132/64  04/14/24 138/82   Repeat on right 132/60 His elevated values appeared to have been during illness, etc Reassured that things are fine now Would not restart spironolactone  or amlodipine  Is on carvedilol  12.5mg  bid and entresto  97/103 bid

## 2024-05-10 NOTE — Telephone Encounter (Signed)
 Noted---I will assess him at today's visit

## 2024-05-10 NOTE — Assessment & Plan Note (Signed)
Regular today

## 2024-05-10 NOTE — Progress Notes (Signed)
 Subjective:    Patient ID: John Hines, male    DOB: 05/27/1932, 89 y.o.   MRN: [id number]  HPI Here due to issues with blood pressure Here with daughter  Blood pressure went up to 171 when he got his medicine for infection (UTI) Now finally better -- lower at home (132 also) Slight headache No chest pain or SOB No dizziness since medication stopped (amlodipine  and spironolactone )  Is getting HOLEP procedure tomorrow--currently has indwellling catheter  Current Outpatient Medications on File Prior to Visit  Medication Sig Dispense Refill   acetaminophen  (TYLENOL ) 500 MG tablet Take 1-2 tablets (500-1,000 mg total) by mouth every 8 (eight) hours as needed (for pain).     atorvastatin  (LIPITOR ) 80 MG tablet TAKE 1 TABLET BY MOUTH ONCE  DAILY 90 tablet 2   carvedilol  (COREG ) 12.5 MG tablet TAKE 1 TABLET BY MOUTH TWICE  DAILY 180 tablet 3   ELIQUIS  2.5 MG TABS tablet TAKE 1 TABLET BY MOUTH TWICE  DAILY 200 tablet 2   empagliflozin  (JARDIANCE ) 10 MG TABS tablet Take 1 tablet (10 mg total) by mouth daily. 90 tablet 0   ENTRESTO  97-103 MG TAKE 1 TABLET BY MOUTH TWICE  DAILY 180 tablet 3   ferrous sulfate  325 (65 FE) MG tablet Take 1 tablet (325 mg total) by mouth daily. 90 tablet 1   levothyroxine  (SYNTHROID ) 75 MCG tablet Take 1 tablet (75 mcg total) by mouth daily before breakfast. 90 tablet 3   tamsulosin  (FLOMAX ) 0.4 MG CAPS capsule Take 0.4 mg by mouth.     amiodarone  (PACERONE ) 200 MG tablet TAKE ONE-HALF TABLET BY MOUTH  DAILY 50 tablet 2   sodium zirconium cyclosilicate  (LOKELMA ) 5 g packet Take 5 g by mouth 2 (two) times a week.     No current facility-administered medications on file prior to visit.    Allergies  Allergen Reactions   Lisinopril Other (See Comments) and Cough    Possible history of cough on med.    Molnupiravir      vertigo   Nsaids Other (See Comments)    Avoid due to renal status   Furosemide  Other (See Comments)    Decreased kidney function     Penicillins Swelling and Rash    Has patient had a PCN reaction causing immediate rash, facial/tongue/throat swelling, SOB or lightheadedness w/hypotension: no hives denies sob but states 'chest was swelling' sounds like rash  Has patient had a PCN reaction causing severe rash involving mucus membranes or skin necrosis: No Has patient had a PCN reaction requiring hospitalization: No Has patient had a PCN reaction occurring within the last 10 years: No If all of the above answers are NO, then may proceed with Cephalosporin use.     Past Medical History:  Diagnosis Date   Arthritis    BPH (benign prostatic hyperplasia)    CHF (congestive heart failure) (HCC)    Nonischemic Cardiomyopathy. EF 25-35%   Chronic kidney disease 05/20/2013   CKD 3   Colon polyps    Dyslipidemia    Dysrhythmia 08/19/11   low heart beat; maybe 25bpm; got RX; now 60bpm   ED (erectile dysfunction)    Gout    Heart murmur    Hyperlipidemia    Hypertension    Low back pain 08/12/2013   Nonfunctioning kidney Recognized 03/2006   Chronically and Severely Hydronephrotic and Nonfunctioning Right Kidney   Solitary kidney Recognized 03/2006   Solitary Functioning Left Kidney   Stroke [facility])  mild stroke; dx'd 2010; don't know when it happened   Syncope and collapse 08/19/11   shallow breathing; sweating horribly    Past Surgical History:  Procedure Laterality Date   CARDIOVASCULAR STRESS TEST  08/30/2010   Small to moderate fixed inferoseptal defect. Severe global hypokinesis with inferior akinesis and septal dyskinesis-likely secondary to a prominent LBBB. Non-diagnostic for ischemia due to persantine.   KNEE ARTHROSCOPY  1996   right   TRANSTHORACIC ECHOCARDIOGRAM  08/20/2011   EF 45-50%, severe concentric hyperthrophy.    Family History  Problem Relation Age of Onset   Early death Mother        died in child birth   Stomach cancer Father        stomach    Social History   Socioeconomic  History   Marital status: Widowed    Spouse name: Not on file   Number of children: Not on file   Years of education: Not on file   Highest education level: Not on file  Occupational History   Not on file  Tobacco Use   Smoking status: Never   Smokeless tobacco: Never  Vaping Use   Vaping status: Never Used  Substance and Sexual Activity   Alcohol use: Yes    Comment: occasional   Drug use: No   Sexual activity: Yes  Other Topics Concern   Not on file  Social History Narrative   Grew up in [place].  His mother died in childbirth when he was an infant.   Widowed 2019.  Was previously married for 57 years.   He works Advertising copywriter for [employer].   Social Drivers of Corporate investment banker Strain: Low Risk  (12/31/2023)   Overall Financial Resource Strain (CARDIA)    Difficulty of Paying Living Expenses: Not hard at all  Food Insecurity: No Food Insecurity (12/31/2023)   Hunger Vital Sign    Worried About Running Out of Food in the Last Year: Never true    Ran Out of Food in the Last Year: Never true  Transportation Needs: No Transportation Needs (12/31/2023)   PRAPARE - Administrator, Civil Service (Medical): No    Lack of Transportation (Non-Medical): No  Physical Activity: Sufficiently Active (12/31/2023)   Exercise Vital Sign    Days of Exercise per Week: 5 days    Minutes of Exercise per Session: 30 min  Stress: No Stress Concern Present (12/31/2023)   [employer] of Occupational Health - Occupational Stress Questionnaire    Feeling of Stress : Not at all  Social Connections: Socially Isolated (12/31/2023)   Social Connection and Isolation Panel    Frequency of Communication with Friends and Family: More than three times a week    Frequency of Social Gatherings with Friends and Family: More than three times a week    Attends Religious Services: Never    Database administrator or Organizations: No    Attends Banker  Meetings: Never    Marital Status: Widowed  Intimate Partner Violence: Not At Risk (12/31/2023)   Humiliation, Afraid, Rape, and Kick questionnaire    Fear of Current or Ex-Partner: No    Emotionally Abused: No    Physically Abused: No    Sexually Abused: No   Review of Systems Eating fair--not great Sleeps okay     Objective:   Physical Exam Constitutional:      Appearance: Normal appearance.  Cardiovascular:     Rate  and Rhythm: Normal rate and regular rhythm.     Heart sounds:     No gallop.     Comments: Faint systolic murmur Pulmonary:     Effort: Pulmonary effort is normal.     Breath sounds: Normal breath sounds. No wheezing or rales.  Musculoskeletal:     Cervical back: Neck supple.     Right lower leg: No edema.     Left lower leg: No edema.  Lymphadenopathy:     Cervical: No cervical adenopathy.  Neurological:     Mental Status: He is alert.            Assessment & Plan:

## 2024-05-10 NOTE — Telephone Encounter (Signed)
 FYI Only or Action Required?: FYI only for provider.  Patient was last seen in primary care on 05/04/2024 by Corwin Antu, FNP.  Called Nurse Triage reporting Hypertension.  Symptoms began several days ago.  Interventions attempted: Prescription medications: Coreg  and Rest, hydration, or home remedies.  Symptoms are: unchanged.  Triage Disposition: See PCP Within 2 Weeks  Patient/caregiver understands and will follow disposition?: Yes  Copied from CRM [id number]. Topic: Clinical - Red Word Triage >> May 10, 2024  8:48 AM Gennette ORN wrote: Red Word that prompted transfer to Nurse Triage: Patient's daughter Raymon is calling because patient blood pressure was 152/76 this was last night. Patient is asking for appointment with Dr.Duncan Or Dr. Dugal due to patient having a upcoming surgery. Reason for Disposition  [1] Systolic BP >= 130 OR Diastolic >= 80 AND [2] taking BP medications  Answer Assessment - Initial Assessment Questions 1. BLOOD PRESSURE: What is your blood pressure? Did you take at least two measurements 5 minutes apart?     152/76 2. ONSET: When did you take your blood pressure?     Blood pressure reading was from last night 3. HOW: How did you take your blood pressure? (e.g., automatic home BP monitor, visiting nurse)     Automatic home BP monitor 4. HISTORY: Do you have a history of high blood pressure?     yes 5. MEDICINES: Are you taking any medicines for blood pressure? Have you missed any doses recently?     Carvedilol  12.5 MG 6. OTHER SYMPTOMS: Do you have any symptoms? (e.g., blurred vision, chest pain, difficulty breathing, headache, weakness)     No symptoms per patient report-but asked daughter to make an appointment.   Patient is scheduled for urology procedure tomorrow. Patient is concerned with blood pressure and having the procedure. Daughter is wanting patient to get checked out by either Dr. Cleatus or Antu Corwin. Daughter requested an  appointment with [name] for tomorrow-states she is going to call the urology office and see if his procedure can be rescheduled so patient can see his preferred provider.  Protocols used: Blood Pressure - High-A-AH

## 2024-05-11 ENCOUNTER — Ambulatory Visit: Admitting: Family

## 2024-05-11 ENCOUNTER — Telehealth: Payer: Self-pay | Admitting: Cardiology

## 2024-05-11 ENCOUNTER — Other Ambulatory Visit: Payer: Self-pay | Admitting: Cardiology

## 2024-05-11 DIAGNOSIS — R339 Retention of urine, unspecified: Secondary | ICD-10-CM | POA: Diagnosis not present

## 2024-05-11 DIAGNOSIS — N138 Other obstructive and reflux uropathy: Secondary | ICD-10-CM | POA: Diagnosis not present

## 2024-05-11 MED ORDER — SACUBITRIL-VALSARTAN 97-103 MG PO TABS: 1.0000 | ORAL_TABLET | Freq: Two times a day (BID) | ORAL | 1 refills | Status: DC

## 2024-05-11 NOTE — Progress Notes (Signed)
 noted

## 2024-05-11 NOTE — Telephone Encounter (Signed)
 RX sent in

## 2024-05-11 NOTE — Telephone Encounter (Signed)
*  STAT* If patient is at the pharmacy, call can be transferred to refill team.   1. Which medications need to be refilled? (please list name of each medication and dose if known)   ENTRESTO  97-103 MG    4. Which pharmacy/location (including street and city if local pharmacy) is medication to be sent to?  [facility] - [place], [place] - [address]     5. Do they need a 30 day or 90 day supply? 90

## 2024-05-19 ENCOUNTER — Other Ambulatory Visit: Payer: Self-pay

## 2024-05-19 ENCOUNTER — Telehealth: Payer: Self-pay | Admitting: Student in an Organized Health Care Education/Training Program

## 2024-05-19 MED ORDER — SACUBITRIL-VALSARTAN 97-103 MG PO TABS
1.0000 | ORAL_TABLET | Freq: Two times a day (BID) | ORAL | 0 refills | Status: DC
Start: 2024-05-19 — End: 2024-06-02

## 2024-05-19 NOTE — Telephone Encounter (Signed)
 PATIENT OUT OF MEDICATION  *STAT* If patient is at the pharmacy, call can be transferred to refill team.   1. Which medications need to be refilled? (please list name of each medication and dose if known) sacubitril -valsartan  (ENTRESTO ) 97-103 MG    2. Would you like to learn more about the convenience, safety, & potential cost savings by using the [facility]? NO   3. Are you open to using the [facility] NO   4. Which pharmacy/location (including street and city if local pharmacy) is medication to be sent to? [id number] - [place], [place] - [address]    5. Do they need a 30 day or 90 day supply? Per Daughter, [facility] has still not delivered her fathers medication. She is wanting [facility] [address] until her dad can get his full 90 day supply. He has been without this medication for some time.

## 2024-05-19 NOTE — Telephone Encounter (Signed)
 [facility]. Previous RX sent to [facility] on 8/19.

## 2024-05-31 DIAGNOSIS — E785 Hyperlipidemia, unspecified: Secondary | ICD-10-CM | POA: Diagnosis not present

## 2024-05-31 DIAGNOSIS — R319 Hematuria, unspecified: Secondary | ICD-10-CM | POA: Diagnosis not present

## 2024-05-31 DIAGNOSIS — D631 Anemia in chronic kidney disease: Secondary | ICD-10-CM | POA: Diagnosis not present

## 2024-05-31 DIAGNOSIS — I509 Heart failure, unspecified: Secondary | ICD-10-CM | POA: Diagnosis not present

## 2024-05-31 DIAGNOSIS — N184 Chronic kidney disease, stage 4 (severe): Secondary | ICD-10-CM | POA: Diagnosis not present

## 2024-05-31 DIAGNOSIS — I129 Hypertensive chronic kidney disease with stage 1 through stage 4 chronic kidney disease, or unspecified chronic kidney disease: Secondary | ICD-10-CM | POA: Diagnosis not present

## 2024-05-31 DIAGNOSIS — R8290 Unspecified abnormal findings in urine: Secondary | ICD-10-CM | POA: Diagnosis not present

## 2024-05-31 NOTE — Progress Notes (Signed)
 [facility] New Consult Visit  Patient Name: John Hines, male   Patient DOB: 1932-07-11 Date of Service: 05/31/2024  Patient MRN: [id number] Provider Creating Note: Woodward Brought, MD  [phone] Primary Care Physician: Corwin Antu, FNP  [address] [place] [address] Additional Physicians/ Providers:    Impression/Recommendations   John Hines is a 89 y.o. male with hypertension, BPH, atrial fibrillation, CVA, congestive heart failure, gout, hyperlipidemia, peripheral vascular disease and hypothyroidism who presents as a new patient with chronic kidney disease stage IV. Previously seeing [facility] in [place]. Creatinine 2.19, GFR of 25.   Chronic Kidney Disease stage IV: with proteinuria and hematuria: Patient with obstructive uropathy, solitary kidney and hypertension. GFR was 28 in 2021.  - Continue Entresto  - not currently on an SGLT-2 inhibitor - not currently on a mineralocorticoid receptor antagonist.  - avoid nonsteroidal anti-inflammatory agents.  - follow up with urology: scheduled for prostate surgery  Hypertension with chronic kidney disease: with chronic congestive heart failure.  - Current regimen of Entresto , tamsulosin  and carvedilol   - home blood pressure monitoring - salt and water restriction.   Anemia with chronic kidney disease: hemoglobin 9.9.  - not currently on ESA therapy.   Secondary Hyperparathyroidism:  - not currently on a vitamin D  agent.   Urinary tract infection:  - completed course of antibiotics  Problem List Patient Active Problem List  Diagnosis  . Chronic kidney disease, Stage IV (severe) (HCC)  . Hematuria  . Anemia in chronic kidney disease  . Hypertensive chronic kidney disease, benign, with chronic kidney disease stage I through stage IV, or unspecified  . Hyperlipidemia  . Congestive heart failure (HCC)    Orders Placed This Encounter  Procedures  . Comprehensive  metabolic panel    Standing Status:   Future    Number of Occurrences:   1    Expected Date:   05/31/2024    Expiration Date:   06/30/2025    Fasting?:   No  . Phosphorus    Standing Status:   Future    Number of Occurrences:   1    Expected Date:   05/31/2024    Expiration Date:   06/30/2025  . CBC and Differential  . Magnesium  . Protein electrophoresis, serum    Standing Status:   Future    Number of Occurrences:   1    Expected Date:   05/31/2024    Expiration Date:   06/30/2025  . Serum free light chains    Standing Status:   Future    Number of Occurrences:   1    Expected Date:   05/31/2024    Expiration Date:   06/30/2025  . Hepatitis B surface antibody    Standing Status:   Future    Number of Occurrences:   1    Expected Date:   05/31/2024    Expiration Date:   06/30/2025  . Hepatitis B core antibody, IgM    Standing Status:   Future    Number of Occurrences:   1    Expected Date:   05/31/2024    Expiration Date:   06/30/2025  . Hepatitis B surface antigen    Standing Status:   Future    Number of Occurrences:   1    Expected Date:   05/31/2024    Expiration Date:   06/30/2025  . Hepatitis C antibody    Standing Status:   Future  Number of Occurrences:   1    Expected Date:   05/31/2024    Expiration Date:   06/30/2025  . PTH, intact    Standing Status:   Future    Number of Occurrences:   1    Expected Date:   05/31/2024    Expiration Date:   06/30/2025  . POCT Urinalysis Auto w Scope   Problem List Items Addressed This Visit     Chronic kidney disease, Stage IV (severe) (HCC)   Hematuria   Anemia in chronic kidney disease (Chronic)   Hypertensive chronic kidney disease, benign, with chronic kidney disease stage I through stage IV, or unspecified   Hyperlipidemia   Congestive heart failure (HCC)   Other Visit Diagnoses       Abnormal urine    -  Primary      Orders Placed This Encounter  . Comprehensive metabolic panel  . Phosphorus  . CBC and Differential  .  Magnesium  . Protein electrophoresis, serum  . Serum free light chains  . Hepatitis B surface antibody  . Hepatitis B core antibody, IgM  . Hepatitis B surface antigen  . Hepatitis C antibody  . PTH, intact  . POCT Urinalysis Auto w Scope       Return in about 4 weeks (around 06/28/2024).   History of Present Illness   Chief Complaint  Patient presents with  . New     John Hines is a 89 y.o. 2-Black or African American male who is being evaluated as a new patient today for chronic kidney disease stage IV. Patients with his daughter who assists with history taking. Patient was previously seen a nephrologist in [place].   Patient found to have BPH with obstructive uropathy. Patient had a foley catheter placed by [facility] on 8/19. GFR improved to 25. Patient was treated with urinary tract infection. Patient is asymptomatic at this time.   Patient states he is doing well. He does have trace peripheral swelling. He states his blood pressure readings at home are at goal: systolic 130s and diastolic 60-70s since catheter placement.   Patient states that he is taking all his medications as prescribed. Patient denies any complication with his medications.   Patient denies history of diabetes.     The following portions of the patient's chart were reviewed in this encounter and updated as appropriate:  Tobacco  Allergies  Meds  Problems  Med Hx  Surg Hx  Fam Hx        Urine Studies   05/31/2024: urine microscopy: TNTC red cells.   History    Medications   Current Outpatient Medications:  .  acetaminophen  (TYLENOL ) 500 MG tablet, Take 1-2 tablets (500-1,000 mg total) by mouth every 8 (eight) hours as needed (for pain)., Disp: , Rfl:  .  amiodarone  (PACERONE ) 200 MG tablet, Take 0.5 tablets (100 mg total) by mouth., Disp: , Rfl:  .  apixaban  (ELIQUIS ) 2.5 MG tablet, Take 2.5 mg by mouth in the morning and 2.5 mg in the evening., Disp: , Rfl:  .   atorvastatin  (LIPITOR ) 80 MG tablet, Take 80 mg by mouth 1 (one) time each day, Disp: , Rfl:  .  carvedilol  (COREG ) 12.5 MG tablet, Take 12.5 mg by mouth in the morning and 12.5 mg in the evening., Disp: , Rfl:  .  Empagliflozin  10 MG tablet, Take 10 mg by mouth in the morning., Disp: , Rfl:  .  ferrous sulfate  325 (65  Fe) MG tablet, Take 1 tablet (325 mg total) by mouth daily., Disp: , Rfl:  .  levothyroxine  (SYNTHROID , LEVOTHROID) 75 MCG tablet, Take 75 mcg by mouth 1 (one) time each day before breakfast, Disp: , Rfl:  .  sacubitril -valsartan  (Entresto ) 97-103 MG per tablet, Take 1 tablet by mouth 2 (two) times daily., Disp: , Rfl:  .  tamsulosin  (FLOMAX ) 0.4 MG 24 hr capsule, Take 1 capsule (0.4 mg total) by mouth two (2) times a day., Disp: , Rfl:    Allergies Penicillins, Lisinopril, Molnupiravir , Nsaids, and Furosemide   History Past Medical History:  Diagnosis Date  . Anemia in chronic kidney disease   . Atrial fibrillation (HCC)   . Benign prostatic hyperplasia   . Cerebrovascular accident (HCC)   . Chronic kidney disease, Stage IV (severe) (HCC)   . Chronic viral hepatitis C (HCC)   . Congestive heart failure (HCC)   . Eczema   . Gout   . Hematuria   . Hyperlipidemia   . Hypertensive chronic kidney disease, benign, with chronic kidney disease stage I through stage IV, or unspecified   . Hypothyroidism   . Peripheral vascular disease (HCC)   . Proteinuria     History reviewed. No pertinent surgical history. Family History  Problem Relation Age of Onset  . Dementia Father   . Cancer Father   . Cancer Brother        Kidney Cancer  . Cancer Brother    Social History   Tobacco Use  . Smoking status: Never  . Smokeless tobacco: Never  Substance Use Topics  . Alcohol use: Not Currently        Physical Exam  Vitals BP 155/63 (BP Location: Right upper arm, Patient Position: Standing)   Pulse 53   Temp 97.9 F   Ht 5' 7 (1.702 m)   Wt 169 lb 3.2 oz (76.7 kg)    SpO2 99%   BMI 26.50 kg/m   Vitals reviewed. Constitutional: He is oriented to person, place, and time. He appears well-developed.  HEENT:  Head: Normocephalic and atraumatic. Mouth/Throat: Oropharynx is clear and moist.  Eyes: Pupils are equal, round, and reactive to light.  Neck: Neck supple.  Cardiovascular:  Normal rate and regular rhythm.          He exhibits edema.  Pulmonary/Chest: Effort normal and breath sounds normal.  Abdominal: Soft.  Neurological: He is alert and oriented to person, place, and time.  Skin: Skin is warm and dry.     Laboratory Studies  Chemistry  Lab Units 03/05/24 1321 03/02/24 9188 03/02/24 0350 03/01/24 0306 02/29/24 0525 02/28/24 2221  SODIUM mmol/L 143 143 144 145 146* 146*  POTASSIUM mmol/L 4.3 4.0 4.1 4.0 4.2 4.2  CHLORIDE mmol/L 105 105 106 110* 111* 112*  CO2 mmol/L 28 29.0 28.0 26.0 25.0 22.0  ANION GAP mmol/L  --  9 10 9 10 12   MAGNESIUM mg/dL  --   --  1.8 2.0 1.9 2.2  CALCIUM  mg/dL 8.8 9.0 8.8 8.7 9.0 9.6  ALK PHOS U/L 108  --   --  96  --  121*  GLUCOSE mg/dL 897* 864 895 884 894 894  ALBUMIN g/dL 4.2  --   --  3.4  --  4.1  BUN mg/dL 25 24* 28* 29* 25* 24*  CREATININE mg/dL 7.45* 7.79* 7.79* 7.49* 2.30* 2.50*    Iron Studies  Lab Units 03/05/24 1321  FERRITIN ng/mL 325        Urine  Lab Units 05/31/24 0901  COLOR UA  Yellow  CLARITY UA  Cloudy  KETONES UA  Negative  PH UA  6.5  UROBILINOGEN UA  0.2        Woodward Brought, MD

## 2024-06-01 NOTE — Progress Notes (Signed)
 Cardiology Office Note:   Date:  06/01/2024  ID:  John Hines, DOB [birth year], MRN [id number] PCP: John Arlyss RAMAN, MD  [facility] Providers Cardiologist:  None Cardiology APP:  John Rollo SAUNDERS, PA-C { Chief Complaint: No chief complaint on file.     History of Present Illness:   John Hines is a 89 y.o. male with a PMH of HFrEF (EF = 35-40%), PAF (on Eliquis ), HTN, HLD, prior CVA, LBBB, hypothyroidism, and CKD with a solitary kidney who presents for follow up.  The patient was a previous patient of Dr. Burnard.  Today the patient presents for follow-up.   Past Medical History:  Diagnosis Date   Arthritis    BPH (benign prostatic hyperplasia)    CHF (congestive heart failure) (HCC)    Nonischemic Cardiomyopathy. EF 25-35%   Chronic kidney disease 05/20/2013   CKD 3   Colon polyps    Dyslipidemia    Dysrhythmia 08/19/11   low heart beat; maybe 25bpm; got RX; now 60bpm   ED (erectile dysfunction)    Gout    Heart murmur    Hyperlipidemia    Hypertension    Low back pain 08/12/2013   Nonfunctioning kidney Recognized 03/2006   Chronically and Severely Hydronephrotic and Nonfunctioning Right Kidney   Solitary kidney Recognized 03/2006   Solitary Functioning Left Kidney   Stroke [facility])    mild stroke; dx'd 2010; don't know when it happened   Syncope and collapse 08/19/11   shallow breathing; sweating horribly    Fam Hx:  Studies Reviewed:    EKG: ***       Cardiac Studies & Procedures   ______________________________________________________________________________________________   STRESS TESTS  NM MYOCAR MULTI W/SPECT W 08/30/2010   ECHOCARDIOGRAM  ECHOCARDIOGRAM COMPLETE 01/31/2023  Narrative ECHOCARDIOGRAM REPORT    Patient Name:   John Hines Date of Exam: 01/31/2023 Medical Rec #:  [id number]        Height:       67.0 in Accession #:    [id number]       Weight:       172.0 lb Date of Birth:  10-16-1931        BSA:           1.897 m Patient Age:    89 years         BP:           134/52 mmHg Patient Gender: M                HR:           67 bpm. Exam Location:  [address]  Procedure: 2D Echo, Cardiac Doppler, Color Doppler and 3D Echo  Indications:    Chronic systolic heart Failure I50.22  History:        Patient has prior history of Echocardiogram examinations, most recent 08/31/2018. CKD; Risk Factors:Hypertension and Dyslipidemia.  Sonographer:    Augustin Seals RDCS Referring Phys: [id number] [facility]  IMPRESSIONS   1. Abnormal septal motion. Left ventricular ejection fraction, by estimation, is 35 to 40%. The left ventricle has moderately decreased function. The left ventricle demonstrates global hypokinesis. The left ventricular internal cavity size was moderately dilated. Left ventricular diastolic parameters were normal. 2. Right ventricular systolic function is normal. The right ventricular size is normal. There is normal pulmonary artery systolic pressure. 3. Left atrial size was moderately dilated. 4. The mitral valve is abnormal. Trivial mitral valve regurgitation. No evidence of mitral stenosis.  5. The aortic valve is tricuspid. There is mild calcification of the aortic valve. There is mild thickening of the aortic valve. Aortic valve regurgitation is not visualized. Aortic valve sclerosis/calcification is present, without any evidence of aortic stenosis. 6. The inferior vena cava is normal in size with greater than 50% respiratory variability, suggesting right atrial pressure of 3 mmHg.  FINDINGS Left Ventricle: Abnormal septal motion. Left ventricular ejection fraction, by estimation, is 35 to 40%. The left ventricle has moderately decreased function. The left ventricle demonstrates global hypokinesis. The left ventricular internal cavity size was moderately dilated. There is no left ventricular hypertrophy. Left ventricular diastolic parameters were normal.  Right Ventricle:  The right ventricular size is normal. No increase in right ventricular wall thickness. Right ventricular systolic function is normal. There is normal pulmonary artery systolic pressure. The tricuspid regurgitant velocity is 2.35 m/s, and with an assumed right atrial pressure of 3 mmHg, the estimated right ventricular systolic pressure is 25.1 mmHg.  Left Atrium: Left atrial size was moderately dilated.  Right Atrium: Right atrial size was normal in size.  Pericardium: There is no evidence of pericardial effusion.  Mitral Valve: The mitral valve is abnormal. There is mild thickening of the mitral valve leaflet(s). Trivial mitral valve regurgitation. No evidence of mitral valve stenosis.  Tricuspid Valve: The tricuspid valve is normal in structure. Tricuspid valve regurgitation is trivial. No evidence of tricuspid stenosis.  Aortic Valve: The aortic valve is tricuspid. There is mild calcification of the aortic valve. There is mild thickening of the aortic valve. Aortic valve regurgitation is not visualized. Aortic valve sclerosis/calcification is present, without any evidence of aortic stenosis.  Pulmonic Valve: The pulmonic valve was normal in structure. Pulmonic valve regurgitation is not visualized. No evidence of pulmonic stenosis.  Aorta: The aortic root is normal in size and structure.  Venous: The inferior vena cava is normal in size with greater than 50% respiratory variability, suggesting right atrial pressure of 3 mmHg.  IAS/Shunts: No atrial level shunt detected by color flow Doppler.   LEFT VENTRICLE PLAX 2D LVIDd:         5.60 cm   Diastology LVIDs:         4.30 cm   LV e' medial:    5.27 cm/s LV PW:         1.10 cm   LV E/e' medial:  12.3 LV IVS:        1.10 cm   LV e' lateral:   5.68 cm/s LVOT diam:     2.30 cm   LV E/e' lateral: 11.4 LV SV:         71 LV SV Index:   38 LVOT Area:     4.15 cm  3D Volume EF: 3D EF:        35 % LV EDV:       174 ml LV ESV:       114  ml LV SV:        61 ml  RIGHT VENTRICLE RV Basal diam:  3.30 cm RV Mid diam:    2.90 cm RV S prime:     5.35 cm/s TAPSE (M-mode): 1.6 cm  LEFT ATRIUM             Index        RIGHT ATRIUM           Index LA diam:        4.30 cm 2.27 cm/m   RA Area:  18.60 cm LA Vol (A2C):   74.9 ml 39.49 ml/m  RA Volume:   48.10 ml  25.36 ml/m LA Vol (A4C):   49.3 ml 25.99 ml/m LA Biplane Vol: 63.0 ml 33.22 ml/m AORTIC VALVE LVOT Vmax:   70.30 cm/s LVOT Vmean:  46.300 cm/s LVOT VTI:    0.172 m  AORTA Ao Root diam: 3.40 cm Ao Asc diam:  3.60 cm  MITRAL VALVE               TRICUSPID VALVE MV Area (PHT): 3.72 cm    TR Peak grad:   22.1 mmHg MV Decel Time: 204 msec    TR Vmax:        235.00 cm/s MV E velocity: 64.70 cm/s MV A velocity: 88.30 cm/s  SHUNTS MV E/A ratio:  0.73        Systemic VTI:  0.17 m Systemic Diam: 2.30 cm  Maude Emmer MD Electronically signed by Maude Emmer MD Signature Date/Time: 01/31/2023/8:38:59 AM    Final          ______________________________________________________________________________________________      Risk Assessment/Calculations:   {Does this patient have ATRIAL FIBRILLATION?[phone]} No BP recorded.  {Refresh Note OR Click here to enter BP  :1}***        Physical Exam:     VS:  There were no vitals taken for this visit. ***    Wt Readings from Last 3 Encounters:  05/10/24 169 lb (76.7 kg)  05/04/24 165 lb 6.4 oz (75 kg)  03/05/24 164 lb (74.4 kg)     GEN: Well nourished, well developed, in no acute distress NECK: No JVD; No carotid bruits CARDIAC: ***RRR, no murmurs, rubs, gallops RESPIRATORY:  Clear to auscultation without rales, wheezing or rhonchi  ABDOMEN: Soft, non-tender, non-distended, normal bowel sounds EXTREMITIES:  Warm and well perfused, no edema; No deformity, 2+ radial pulses PSYCH: Normal mood and affect   Assessment & Plan Chronic systolic (congestive) heart failure (HCC)  Primary  hypertension  Paroxysmal atrial fibrillation (HCC)  Mixed hyperlipidemia       {Are you ordering a CV Procedure (e.g. stress test, cath, DCCV, TEE, etc)?   Press F2        :[id number]}   This note was written with the assistance of a dictation microphone or AI dictation software. Please excuse any typos or grammatical errors.   Signed, Georganna Archer, MD 06/01/2024 12:05 PM    [facility]

## 2024-06-02 ENCOUNTER — Encounter: Payer: Self-pay | Admitting: Student in an Organized Health Care Education/Training Program

## 2024-06-02 ENCOUNTER — Ambulatory Visit
Attending: Student in an Organized Health Care Education/Training Program | Admitting: Student in an Organized Health Care Education/Training Program

## 2024-06-02 VITALS — BP 130/58 | HR 59 | Ht 67.0 in | Wt 170.0 lb

## 2024-06-02 DIAGNOSIS — E782 Mixed hyperlipidemia: Secondary | ICD-10-CM | POA: Diagnosis not present

## 2024-06-02 DIAGNOSIS — I10 Essential (primary) hypertension: Secondary | ICD-10-CM | POA: Diagnosis not present

## 2024-06-02 DIAGNOSIS — I480 Paroxysmal atrial fibrillation: Secondary | ICD-10-CM | POA: Diagnosis not present

## 2024-06-02 DIAGNOSIS — I5022 Chronic systolic (congestive) heart failure: Secondary | ICD-10-CM

## 2024-06-02 DIAGNOSIS — Z01818 Encounter for other preprocedural examination: Secondary | ICD-10-CM

## 2024-06-02 MED ORDER — SACUBITRIL-VALSARTAN 97-103 MG PO TABS: 1.0000 | ORAL_TABLET | Freq: Two times a day (BID) | ORAL | 3 refills | Status: DC

## 2024-06-02 MED ORDER — APIXABAN 2.5 MG PO TABS: 2.5000 mg | ORAL_TABLET | Freq: Two times a day (BID) | ORAL | 3 refills | Status: AC

## 2024-06-02 MED ORDER — AMIODARONE HCL 200 MG PO TABS: 100.0000 mg | ORAL_TABLET | Freq: Every day | ORAL | 2 refills | Status: AC

## 2024-06-02 MED ORDER — CARVEDILOL 12.5 MG PO TABS: 12.5000 mg | ORAL_TABLET | Freq: Two times a day (BID) | ORAL | 3 refills | Status: DC

## 2024-06-02 MED ORDER — EMPAGLIFLOZIN 10 MG PO TABS: 10.0000 mg | ORAL_TABLET | Freq: Every day | ORAL | 3 refills | Status: DC

## 2024-06-02 MED ORDER — ATORVASTATIN CALCIUM 80 MG PO TABS: 80.0000 mg | ORAL_TABLET | Freq: Every day | ORAL | 3 refills | Status: AC

## 2024-06-02 NOTE — Assessment & Plan Note (Signed)
 His blood pressure is at goal.  No changes

## 2024-06-02 NOTE — Assessment & Plan Note (Signed)
 His LDL is at goal.  No changes.

## 2024-06-02 NOTE — Patient Instructions (Signed)
 Medication Instructions:  Refills reviewed and approved by Dr. Floretta   *If you need a refill on your cardiac medications before your next appointment, please call your pharmacy*  Follow-Up: At [facility], you and your health needs are our priority.  As part of our continuing mission to provide you with exceptional heart care, our providers are all part of one team.  This team includes your primary Cardiologist (physician) and Advanced Practice Providers or APPs (Physician Assistants and Nurse Practitioners) who all work together to provide you with the care you need, when you need it.  Your next appointment:   6 month(s)  Provider:   Georganna Floretta, MD    We recommend signing up for the patient portal called MyChart.  Sign up information is provided on this After Visit Summary.  MyChart is used to connect with patients for Virtual Visits (Telemedicine).  Patients are able to view lab/test results, encounter notes, upcoming appointments, etc.  Non-urgent messages can be sent to your provider as well.   To learn more about what you can do with MyChart, go to [email or web address].

## 2024-06-07 NOTE — Telephone Encounter (Unsigned)
 Copied from CRM #[id number]. Topic: Clinical - Prescription Issue >> Jun 04, 2024  5:24 PM Chiquita SQUIBB wrote: Reason for CRM: Patients daughter is calling in stating her father was diagnosed with a UTI by his kidney doctor and they sent in the medication in correctly to the pharmacy so the pharmacy was unable to fill it, the office has now closed so her father is without any medication for the weekend. The daughter is asking if a prescription for Septera, which is what the kidney doctor was trying to call into [facility] in [place]. Please advise patients daughter if anything can be done for the weekend.

## 2024-06-11 DIAGNOSIS — N138 Other obstructive and reflux uropathy: Secondary | ICD-10-CM | POA: Diagnosis not present

## 2024-06-14 ENCOUNTER — Telehealth: Payer: Self-pay

## 2024-06-14 NOTE — Telephone Encounter (Signed)
 Will route to the preop pool for the preop app to review.

## 2024-06-14 NOTE — Telephone Encounter (Signed)
   Patient Name: John Hines  DOB: 09-Dec-1931 MRN: [id number]  Primary Cardiologist: Georganna Archer, MD  Chart reviewed as part of pre-operative protocol coverage. Patient was recently seen by Dr. Archer on 06/02/2024 and pre-op evaluation for upcoming urologic procedure was addressed at that time.   Per Dr. Ruthanne note: Preoperative examination  The patient can perform >4 METS without limitation.  He is currently asymptomatic and euvolemic by my exam.  No additional cardiac testing is warranted at this time prior to the urologic procedure.  If his Eliquis  needs to be held prior to his procedure, I recommend holding it for 24 hours prior to his procedure and then restarting it as soon as possible once safe from a postprocedural standpoint.  I will route this recommendation to the requesting party via Epic fax function and remove from pre-op pool.  Please call with questions.  Kimbella Heisler E Sumi Lye, PA-C 06/14/2024, 1:22 PM

## 2024-06-14 NOTE — Telephone Encounter (Signed)
   Pre-operative Risk Assessment    Patient Name: John Hines  DOB: 30-Oct-1931 MRN: [id number]   Date of last office visit: 06/02/24 GEORGANNA ARCHER, MD Date of next office visit: NONE   Request for Surgical Clearance    Procedure:  LASER ENUCLEATION OF PROSTATE  Date of Surgery:  Clearance TBD                                Surgeon:  DEARL BASQUES, DO Surgeon's Group or Practice Name:  [facility] Phone number:  [phone] Fax number:  [phone]   Type of Clearance Requested:   - Medical  - Pharmacy:  Hold Apixaban  (Eliquis )     Type of Anesthesia:  Not Indicated   Additional requests/questions:    SignedLucie DELENA Ku   06/14/2024, 12:45 PM

## 2024-06-14 NOTE — Telephone Encounter (Signed)
 Surgery scheduler Terri with [facility] calling to ask if pt could hold Eliquis  for 5 days instead of the prior approved 1 day. Please advise.

## 2024-06-15 NOTE — Telephone Encounter (Signed)
 Hi Dr. Floretta! You recently saw this patient for pre-op evaluation. The surgery team is now asking if Eliquis  can be held for longer. They would like to hold it for 5 days. Do you mind commenting on this?  Please route response to P CV DIV PREOP.  Thanks so much! Shanise Balch

## 2024-06-15 NOTE — Telephone Encounter (Signed)
 Per Dr. Floretta, okay to hold Eliquis  for 5 days as requested and then restart once safe from a surgery perspective. No need to bridge.   Will route updated recommendation to requesting office.  Oneita Allmon E Simmie Camerer, PA-C 06/15/2024 10:03 AM

## 2024-06-21 DIAGNOSIS — E785 Hyperlipidemia, unspecified: Secondary | ICD-10-CM | POA: Diagnosis not present

## 2024-06-21 DIAGNOSIS — N184 Chronic kidney disease, stage 4 (severe): Secondary | ICD-10-CM | POA: Diagnosis not present

## 2024-06-21 DIAGNOSIS — R319 Hematuria, unspecified: Secondary | ICD-10-CM | POA: Diagnosis not present

## 2024-06-21 DIAGNOSIS — R8290 Unspecified abnormal findings in urine: Secondary | ICD-10-CM | POA: Diagnosis not present

## 2024-06-21 DIAGNOSIS — I129 Hypertensive chronic kidney disease with stage 1 through stage 4 chronic kidney disease, or unspecified chronic kidney disease: Secondary | ICD-10-CM | POA: Diagnosis not present

## 2024-07-05 DIAGNOSIS — E785 Hyperlipidemia, unspecified: Secondary | ICD-10-CM | POA: Diagnosis not present

## 2024-07-05 DIAGNOSIS — D631 Anemia in chronic kidney disease: Secondary | ICD-10-CM | POA: Diagnosis not present

## 2024-07-05 DIAGNOSIS — N184 Chronic kidney disease, stage 4 (severe): Secondary | ICD-10-CM | POA: Diagnosis not present

## 2024-07-05 DIAGNOSIS — I509 Heart failure, unspecified: Secondary | ICD-10-CM | POA: Diagnosis not present

## 2024-07-05 DIAGNOSIS — R319 Hematuria, unspecified: Secondary | ICD-10-CM | POA: Diagnosis not present

## 2024-07-05 DIAGNOSIS — I129 Hypertensive chronic kidney disease with stage 1 through stage 4 chronic kidney disease, or unspecified chronic kidney disease: Secondary | ICD-10-CM | POA: Diagnosis not present

## 2024-07-05 NOTE — Progress Notes (Signed)
 [facility] Follow Up Visit   Patient Name: John Hines, male   Patient DOB: 1932-02-04 Date of Service: 07/05/2024  Patient MRN: [id number] Provider Creating Note: Woodward Brought, MD  [phone] Primary Care Physician: Corwin Antu, FNP   [address] [place] [address] Additional Physicians/ Providers:   Impression/Recommendations   Mr. John Hines is a 89 y.o. male with hypertension, BPH, atrial fibrillation, CVA, congestive heart failure, gout, hyperlipidemia, peripheral vascular disease and hypothyroidism who presents as a new patient with chronic kidney disease stage IV. Previously seeing [facility] in [place]. Creatinine 2.16, GFR of 28.    Chronic Kidney Disease stage IV: with proteinuria and hematuria: Patient with obstructive uropathy, solitary kidney and hypertension.   - Continue Entresto  - not currently on an SGLT-2 inhibitor - not currently on a mineralocorticoid receptor antagonist. Previously on spironolactone , discontinued in the past due to hypotension.  - avoid nonsteroidal anti-inflammatory agents.  - follow up with urology: scheduled for prostate surgery   Hypertension with chronic kidney disease: with chronic congestive heart failure.  - Current regimen of Entresto , tamsulosin  and carvedilol   - home blood pressure monitoring - salt and water restriction.    Anemia with chronic kidney disease: hemoglobin 10.9 - not currently on ESA therapy.    Secondary Hyperparathyroidism:  - not currently on a vitamin D  agent.    BPH - surgery scheduled for 11/15 with Dr. Azalia at [facility]   Patient Active Problem List  Diagnosis  . Chronic kidney disease, Stage IV (severe) (HCC)  . Hematuria  . Anemia in chronic kidney disease  . Hypertensive chronic kidney disease, benign, with chronic kidney disease stage I through stage IV, or unspecified  . Hyperlipidemia  . Congestive heart failure (HCC)    Orders  Placed This Encounter  . PTH, Intact  . Renal Function Panel  . CBC and Differential  . Urinalysis, Complete w/reflex to Culture  . Protein, Total, Random Urine w/Creatinine (Protein/Creat Ratio)       Return in about 4 weeks (around 08/02/2024).  Chief Complaint   Chief Complaint  Patient presents with  . Follow-up    History of Present Illness   Mr. John Hines presents for follow up. Patient presents with his daughter who assists with history taking. Patient is scheduled for prostate surgery on November 15 at [facility].   Patient currently with an indwelling foley catheter and leg bag. He has no issues.   Patient has brought in his home blood pressure log. Systolic ranging from 120-150s and diastolic 50-60s. He does endorse some orthostatic symptoms.   Patient claims to be taking all his medications as prescribed. No issues. No use of nonsteroidal anti-inflammatory agents.       Medications   Current Outpatient Medications:  .  acetaminophen  (TYLENOL ) 500 MG tablet, Take 1-2 tablets (500-1,000 mg total) by mouth every 8 (eight) hours as needed (for pain)., Disp: , Rfl:  .  amiodarone  (PACERONE ) 200 MG tablet, Take 0.5 tablets (100 mg total) by mouth., Disp: , Rfl:  .  apixaban  (ELIQUIS ) 2.5 MG tablet, Take 2.5 mg by mouth in the morning and 2.5 mg in the evening., Disp: , Rfl:  .  atorvastatin  (LIPITOR ) 80 MG tablet, Take 80 mg by mouth 1 (one) time each day, Disp: , Rfl:  .  carvedilol  (COREG ) 12.5 MG tablet, Take 12.5 mg by mouth in the morning and 12.5 mg in the evening., Disp: , Rfl:  .  ferrous  sulfate 325 (65 Fe) MG tablet, Take 1 tablet (325 mg total) by mouth daily., Disp: , Rfl:  .  levothyroxine  (SYNTHROID , LEVOTHROID) 75 MCG tablet, Take 75 mcg by mouth 1 (one) time each day before breakfast, Disp: , Rfl:  .  sacubitril -valsartan  (Entresto ) 97-103 MG per tablet, Take 1 tablet by mouth 2 (two) times daily., Disp: , Rfl:  .  tamsulosin  (FLOMAX ) 0.4 MG 24 hr  capsule, Take 1 capsule (0.4 mg total) by mouth two (2) times a day., Disp: , Rfl:    Allergies Penicillins, Lisinopril, Molnupiravir , Nsaids, and Furosemide   History Past Medical History:  Diagnosis Date  . Anemia in chronic kidney disease   . Atrial fibrillation (HCC)   . Benign prostatic hyperplasia   . Cerebrovascular accident (HCC)   . Chronic kidney disease, Stage IV (severe) (HCC)   . Chronic viral hepatitis C (HCC)   . Congestive heart failure (HCC)   . Eczema   . Gout   . Hematuria   . Hyperlipidemia   . Hypertensive chronic kidney disease, benign, with chronic kidney disease stage I through stage IV, or unspecified   . Hypothyroidism   . Peripheral vascular disease (HCC)   . Proteinuria     History reviewed. No pertinent surgical history. Family History  Problem Relation Age of Onset  . Dementia Father   . Cancer Father   . Cancer Brother        Kidney Cancer  . Cancer Brother    Social History   Tobacco Use  . Smoking status: Never  . Smokeless tobacco: Never  Substance Use Topics  . Alcohol use: Not Currently     Physical Exam  Vitals BP 142/75 (BP Location: Left upper arm, Patient Position: Standing)   Pulse 58   Temp 97.9 F   Wt 172 lb (78 kg)   SpO2 98%   BMI 26.94 kg/m   Vitals reviewed. Constitutional: He is oriented to person, place, and time. He appears well-developed.  HEENT:  Head: Normocephalic and atraumatic. Mouth/Throat: Oropharynx is clear and moist.  Eyes: Pupils are equal, round, and reactive to light.  Neck: Neck supple.  Cardiovascular:  Normal rate and regular rhythm.           Pulmonary/Chest: Effort normal and breath sounds normal.  Abdominal: Soft.  Neurological: He is alert and oriented to person, place, and time.  Skin: Skin is warm and dry.     Laboratory Studies  Chemistry  Lab Units 06/21/24 0901 05/31/24 9055 03/05/24 1321 03/02/24 9188 03/02/24 0350 03/01/24 0306 02/29/24 0525 02/28/24 2221  SODIUM  mmol/L  --  141 143 143 144 145 146* 146*  POTASSIUM mmol/L  --  4.7 4.3 4.0 4.1 4.0 4.2 4.2  CHLORIDE mmol/L  --  108 105 105 106 110* 111* 112*  CO2 mmol/L  --  26 28 29.0 28.0 26.0 25.0 22.0  ANION GAP mmol/L  --   --   --  9 10 9 10 12   MAGNESIUM mg/dL  --  2.1  --   --  1.8 2.0 1.9 2.2  CALCIUM  mg/dL  --  8.8 8.8 9.0 8.8 8.7 9.0 9.6  PHOSPHORUS mg/dL  --  2.9  --   --   --   --   --   --   ALK PHOS U/L  --  142 108  --   --  96  --  121*  PTH pg/mL 61  --   --   --   --   --   --   --  GLUCOSE mg/dL  --  880* 897* 864 895 115 105 105  ALBUMIN g/dL  --  3.8  4.1 4.2  --   --  3.4  --  4.1  BUN mg/dL  --  20 25 24* 28* 29* 25* 24*  CREATININE mg/dL  --  7.83* 7.45* 7.79* 2.20* 2.50* 2.30* 2.50*    Iron Studies  Lab Units 03/05/24 1321  FERRITIN ng/mL 325    CBC  Lab Units 05/31/24 0944  WBC AUTO Thousand/uL 3.5*  HEMOGLOBIN g/dL 89.0*  HEMATOCRIT % 65.1*  MCV fL 82.7  PLATELETS AUTO Thousand/uL 196    Urine  Lab Units 05/31/24 0901  COLOR UA  Yellow  CLARITY UA  Cloudy  KETONES UA  Negative  PH UA  6.5  UROBILINOGEN UA  0.2        No lab exists for component: CYCLOSPORITR     Woodward Brought, MD  [facility] [place], [place]

## 2024-07-13 DIAGNOSIS — I5022 Chronic systolic (congestive) heart failure: Secondary | ICD-10-CM | POA: Diagnosis not present

## 2024-07-13 DIAGNOSIS — I428 Other cardiomyopathies: Secondary | ICD-10-CM | POA: Diagnosis not present

## 2024-07-13 DIAGNOSIS — Z8673 Personal history of transient ischemic attack (TIA), and cerebral infarction without residual deficits: Secondary | ICD-10-CM | POA: Diagnosis not present

## 2024-07-13 DIAGNOSIS — N184 Chronic kidney disease, stage 4 (severe): Secondary | ICD-10-CM | POA: Diagnosis not present

## 2024-07-13 DIAGNOSIS — E782 Mixed hyperlipidemia: Secondary | ICD-10-CM | POA: Diagnosis not present

## 2024-07-13 DIAGNOSIS — N320 Bladder-neck obstruction: Secondary | ICD-10-CM | POA: Diagnosis not present

## 2024-07-13 DIAGNOSIS — E875 Hyperkalemia: Secondary | ICD-10-CM | POA: Diagnosis not present

## 2024-07-13 DIAGNOSIS — I130 Hypertensive heart and chronic kidney disease with heart failure and stage 1 through stage 4 chronic kidney disease, or unspecified chronic kidney disease: Secondary | ICD-10-CM | POA: Diagnosis not present

## 2024-07-13 DIAGNOSIS — Z7901 Long term (current) use of anticoagulants: Secondary | ICD-10-CM | POA: Diagnosis not present

## 2024-07-13 DIAGNOSIS — R9431 Abnormal electrocardiogram [ECG] [EKG]: Secondary | ICD-10-CM | POA: Diagnosis not present

## 2024-07-13 DIAGNOSIS — D631 Anemia in chronic kidney disease: Secondary | ICD-10-CM | POA: Diagnosis not present

## 2024-07-13 DIAGNOSIS — I480 Paroxysmal atrial fibrillation: Secondary | ICD-10-CM | POA: Diagnosis not present

## 2024-07-13 DIAGNOSIS — E039 Hypothyroidism, unspecified: Secondary | ICD-10-CM | POA: Diagnosis not present

## 2024-07-13 DIAGNOSIS — I272 Pulmonary hypertension, unspecified: Secondary | ICD-10-CM | POA: Diagnosis not present

## 2024-07-13 DIAGNOSIS — Z79899 Other long term (current) drug therapy: Secondary | ICD-10-CM | POA: Diagnosis not present

## 2024-07-13 DIAGNOSIS — Z01818 Encounter for other preprocedural examination: Secondary | ICD-10-CM | POA: Diagnosis not present

## 2024-07-13 DIAGNOSIS — I422 Other hypertrophic cardiomyopathy: Secondary | ICD-10-CM | POA: Diagnosis not present

## 2024-07-13 DIAGNOSIS — I5032 Chronic diastolic (congestive) heart failure: Secondary | ICD-10-CM | POA: Diagnosis not present

## 2024-07-29 NOTE — Discharge Summary (Signed)
 Discharge Summary  Admit date: 07/27/2024  Discharge date and time: 07/29/2024  Discharge to:  Home  Discharge Service: Surg Urology (SRU)  Discharge Attending Physician: Dearl Basques, DO  Discharge  Diagnoses: Enlarged prostate with urinary retention  Secondary Diagnosis: Active Problems:   * No active hospital problems. * Resolved Problems:   * No resolved hospital problems. *   OR Procedures:   Cystoscopy laser enucleation of prostate Date 07/28/2024 -------------------   Ancillary Procedures: no procedures  Discharge Day Services:   Subjective  No acute events overnight. Pain Controlled. No fever or chills.  Objective  Patient Vitals for the past 8 hrs:  BP Temp Temp src Pulse Resp SpO2  07/29/24 1146 116/62 36.6 C (97.9 F) Oral 62 16 100 %  07/29/24 0708 142/56 36.6 C (97.9 F) Oral 63 16 99 %   I/O this shift: In: 20 [I.V.:20] Out: -   General: NAD HEENT: atraumatic, normocephalic, EOMI Cardio: Skin is well perfused Chest: Normal chest excursion without splinting or tachypnea, No acute respiratory distress Abd: soft, NT/ND, no rebound tenderness, guarding, or rigidity GU: no CVAT, no suprapubic tenderness, 24 French three-way Foley-clear yellow urine off of CBI   Hospital Course:   The patient is a 89 year old male that underwent a laser nucleation of prostate on 07/28/2024 without complications and the patient tolerated the procedure well.  On postoperative day 1 his urine was clear yellow off of CBI and therefore he was cleared for discharge and will follow-up in the office for void trial.  Condition at Discharge: Improved Discharge Medications:    Medication List    PAUSE taking these medications   . apixaban  2.5 mg Tab; Wait to take this until: August 01, 2024; Commonly  known as: ELIQUIS ; Take 1 tablet (2.5 mg total) by mouth two (2) times a  day. . carvedilol  12.5 MG tablet; Wait to take this until: July 30, 2024;  Commonly known as:  COREG ; Take 1 tablet (12.5 mg total) by mouth two (2)  times a day.   CONTINUE taking these medications   . acetaminophen  500 MG tablet; Commonly known as: TYLENOL  . amiodarone  200 MG tablet; Commonly known as: PACERONE  . amlodipine  5 MG tablet; Commonly known as: NORVASC  . atorvastatin  80 MG tablet; Commonly known as: LIPITOR  . ferrous sulfate  325 (65 FE) MG tablet . levothyroxine  75 MCG tablet; Commonly known as: SYNTHROID  . nitrofurantoin  (macrocrystal-monohydrate) 100 MG capsule; Commonly known  as: MACROBID ; Take 1 capsule (100 mg total) by mouth two (2) times a day  for 10 days. . sacubitril -valsartan  97-103 mg tablet; Commonly known as: ENTRESTO  . tamsulosin  0.4 mg capsule; Commonly known as: FLOMAX ; Take 1 capsule  (0.4 mg total) by mouth two (2) times a day.   STOP taking these medications   . empagliflozin  10 mg tablet; Commonly known as: JARDIANCE     Pending Test Results:   Discharge Instructions: Activity: Do not lift anything greater than 20 pounds for the next 2 weeks.  Diet: Resume your normal diet.  Other Instructions: Follow-up in the office to have your Foley catheter removed.  Labs and Other Follow-ups after Discharge:   Future Appointments: Appointments which have been scheduled for you    Aug 03, 2024 10:00 AM (Arrive by 9:50 AM) RETURN UROLOGY with Dearl Basques, DO [facility] [address] [place] [address] [phone]

## 2024-07-30 ENCOUNTER — Telehealth: Payer: Self-pay

## 2024-07-30 NOTE — Telephone Encounter (Signed)
 This encounter was created in error - please disregard.

## 2024-07-30 NOTE — Patient Instructions (Signed)
 Visit Information  Thank you for taking time to visit with me today. Please don't hesitate to contact me if I can be of assistance to you before our next scheduled telephone appointment.  Our next appointment is by telephone on 08/06/24 at 10 am  Following is a copy of your care plan:   Goals Addressed             This Visit's Progress    VBCI Transitions of Care (TOC) Care Plan       Problems:  Recent Hospitalization for treatment of enlarged prostate with urinary retention Knowledge Deficit Related to status post cystoscopy laser enucleation of prostate/ home with foley catheter  Goal:  Over the next 30 days, the patient will not experience hospital readmission  Interventions:  Transitions of Care: Doctor Visits  - discussed the importance of doctor visits Communication with primary care provider re: enrollment into the 30 day TOC program. Contacted provider for patient needs regarding medication concerns/ discrepancy Post discharge activity limitations prescribed by provider reviewed Advised to monitor blood pressures 1-2 times per day. Advised to report frequent low blood pressures <110/ <60 or pulse <60 to provider Advised to check with primary care provider or pharmacist regarding which cold medication to take if needed. Assessed pain level Assessed for urinary flow/ blood in urine Discussed catheter care Advised to notify provider for new or ongoing symptoms.  Advised to adhere to post procedure activity limitations as instructed in discharge summary  Advised to take medications as prescribed and take antibiotic as instructed Advised to call medical equipment company or check on amaz[employer] regarding velcro underwear Advised to keep follow up appointments with providers Discussed and offered 30 day TOC program Advised to call 911 for severe symptoms with breathing and/ or chest pain  Patient Self Care Activities:  Attend all scheduled provider appointments Call pharmacy  for medication refills 3-7 days in advance of running out of medications Call provider office for new concerns or questions  Notify RN Care Manager of TOC call rescheduling needs Participate in Transition of Care Program/Attend TOC scheduled calls Take medications as prescribed    monitor blood pressures 1-2 times per day. Advised to report frequent low blood pressures <110/ <60 or pulse <60  check with primary care provider or pharmacist regarding which cold medication to take if needed. Maintain catheter care notify provider for new or ongoing symptoms.  adhere to post procedure activity limitations as instructed in discharge summary  take medications as prescribed and take antibiotic as instructed call medical equipment company or check on amaz[employer] regarding velcro underwear keep follow up appointments with providers Call 911 for severe symptoms with breathing and/ or chest pain  Plan:  Telephone follow up appointment with care management team member scheduled for:  08/06/24 at 10 am The patient has been provided with contact information for the care management team and has been advised to call with any health related questions or concerns.         Patient verbalizes understanding of instructions and care plan provided today and agrees to view in MyChart. Active MyChart status and patient understanding of how to access instructions and care plan via MyChart confirmed with patient.     The patient has been provided with contact information for the care management team and has been advised to call with any health related questions or concerns.   Please call the care guide team at [phone] if you need to cancel or reschedule your appointment.  Please call the Suicide and Crisis Lifeline: 988 call the [place]  National Suicide Prevention Lifeline: 336-041-3289 or TTY: 737-162-7559 TTY 757-260-2714) to talk to a trained counselor call 1-800-273-TALK (toll free, 24 hour hotline) if you  are experiencing a Mental Health or Behavioral Health Crisis or need someone to talk to.  Arvin Seip RN, BSN, CCM [employer], Population Health Case Manager Phone: [phone]

## 2024-07-30 NOTE — Transitions of Care (Post Inpatient/ED Visit) (Signed)
 07/30/2024  Name: John Hines MRN: [id number] DOB: 1932-03-28  Today's TOC FU Call Status: Today's TOC FU Call Status:: Successful TOC FU Call Completed TOC FU Call Complete Date: 07/30/24 Patient's Name and Date of Birth confirmed.  Transition Care Management Follow-up Telephone Call Date of Discharge: 07/29/24 Discharge Facility: Other [facility]) Name of Other (Non[facility]) Discharge Facility: [facility] hospital Type of Discharge: Inpatient Admission Primary Inpatient Discharge Diagnosis:: enlarged prostate with urinary retention How have you been since you were released from the hospital?: Better Any questions or concerns?: No  Items Reviewed: Did you receive and understand the discharge instructions provided?: Yes Medications obtained,verified, and reconciled?: Yes (Medications Reviewed) Any new allergies since your discharge?: No Dietary orders reviewed?: Yes Type of Diet Ordered:: low salt heart healthy Do you have support at home?: Yes People in Home [RPT]: child(ren), adult Name of Support/Comfort Primary Source: Leta Alston  Medications Reviewed Today: Medications Reviewed Today     Reviewed by Yaira Bernardi E, RN (Registered Nurse) on 07/30/24 at 1124  Med List Status: <None>   Medication Order Taking? Sig Documenting Provider Last Dose Status Informant  acetaminophen  (TYLENOL ) 500 MG tablet [id number] Yes Take 1-2 tablets (500-1,000 mg total) by mouth every 8 (eight) hours as needed (for pain). Cleatus Arlyss RAMAN, MD  Active   amiodarone  (PACERONE ) 200 MG tablet [id number] Yes Take 0.5 tablets (100 mg total) by mouth daily. Patwardhan, Newman PARAS, MD  Active   apixaban  (ELIQUIS ) 2.5 MG TABS tablet [id number] Yes Take 1 tablet (2.5 mg total) by mouth 2 (two) times daily. Elmira Newman PARAS, MD  Active   atorvastatin  (LIPITOR ) 80 MG tablet [id number] Yes Take 1 tablet (80 mg total) by mouth daily. Patwardhan, Newman PARAS, MD  Active   carvedilol  (COREG ) 12.5 MG tablet  [id number] Yes Take 1 tablet (12.5 mg total) by mouth 2 (two) times daily. Patwardhan, Newman PARAS, MD  Active   empagliflozin  (JARDIANCE ) 10 MG TABS tablet [id number]  Take 1 tablet (10 mg total) by mouth daily.  Patient not taking: Reported on 07/30/2024   Elmira Newman PARAS, MD  Active   ferrous sulfate  325 (65 FE) MG tablet [id number] Yes Take 1 tablet (325 mg total) by mouth daily. Cleatus Arlyss RAMAN, MD  Active   levothyroxine  (SYNTHROID ) 75 MCG tablet [id number] Yes Take 1 tablet (75 mcg total) by mouth daily before breakfast. Cleatus Arlyss RAMAN, MD  Active   sacubitril -valsartan  (ENTRESTO ) 97-103 MG [id number] Yes Take 1 tablet by mouth 2 (two) times daily. Patwardhan, Newman PARAS, MD  Active   tamsulosin  (FLOMAX ) 0.4 MG CAPS capsule [id number] Yes Take 0.4 mg by mouth. [provider]  Active             Home Care and Equipment/Supplies: Were Home Health Services Ordered?: No Any new equipment or medical supplies ordered?: Yes (patient has foley catheter) Name of Medical supply agency?: unsure per patient Were you able to get the equipment/medical supplies?: Yes Do you have any questions related to the use of the equipment/supplies?: No  Functional Questionnaire: Do you need assistance with bathing/showering or dressing?: No Do you need assistance with meal preparation?: No Do you need assistance with eating?: No Do you have difficulty maintaining continence: No Do you need assistance with getting out of bed/getting out of a chair/moving?: No Do you have difficulty managing or taking your medications?: Yes (daughter assist)  Follow up appointments reviewed: PCP Follow-up appointment confirmed?: Yes Date of PCP follow-up appointment?: 08/10/24 Follow-up Provider:  Dr. Cleatus Specialist [facility] Follow-up appointment confirmed?: Yes Date of Specialist follow-up appointment?: 08/03/24 Follow-Up Specialty Provider:: Dr. Azalia Do you need transportation to your follow-up  appointment?: No Do you understand care options if your condition(s) worsen?: Yes-patient verbalized understanding  SDOH Interventions Today    Flowsheet Row Most Recent Value  SDOH Interventions   Food Insecurity Interventions Intervention Not Indicated  Housing Interventions Intervention Not Indicated  Transportation Interventions Intervention Not Indicated  Utilities Interventions Intervention Not Indicated   Discussed and offered 30 day TOC program.  Patient verbally agreed.  The patient has been provided with contact information for the care management team and has been advised to call with any health -related questions or concerns.  The patient verbalized understanding with current plan of care.  The patient is directed to their insurance card regarding availability of benefits coverage.    Arvin Seip RN, BSN, CCM [employer], Population Health Case Manager Phone: [phone]

## 2024-08-01 ENCOUNTER — Ambulatory Visit: Payer: Self-pay | Admitting: Family Medicine

## 2024-08-10 ENCOUNTER — Ambulatory Visit: Admitting: Family Medicine

## 2024-08-10 ENCOUNTER — Encounter: Payer: Self-pay | Admitting: Family Medicine

## 2024-08-10 VITALS — BP 122/68 | HR 61 | Temp 98.0°F | Ht 67.0 in | Wt 173.2 lb

## 2024-08-10 DIAGNOSIS — N400 Enlarged prostate without lower urinary tract symptoms: Secondary | ICD-10-CM | POA: Diagnosis not present

## 2024-08-10 DIAGNOSIS — N183 Chronic kidney disease, stage 3 unspecified: Secondary | ICD-10-CM

## 2024-08-10 MED ORDER — TAMSULOSIN HCL 0.4 MG PO CAPS: 0.4000 mg | ORAL_CAPSULE | Freq: Two times a day (BID) | ORAL | Status: DC

## 2024-08-10 NOTE — Patient Instructions (Signed)
 I would take flomax  twice a day for now.  Update me as needed.  Take care.  Glad to see you.

## 2024-08-10 NOTE — Progress Notes (Unsigned)
 S/p HoLEP.  Benign prostate path, d/w pt.    Most rec Cr improved to 2.  Hgb stable at 9.8.   Recent labs discussed with patient.  Urine stream is better but some leak with cough.  D/w pt about kegel exercise.  He is working on that.    He is off jardiance  currently.  Med list updated.    He had been taking flomax  every day then increased back to BID.  He couldn't see a difference with every day vs BID dosing.  D/w pt about still trying BID dosing for now.  He isn't lightheaded. Still with some hematuria but better than prior.  No burning with urination but some discomfort, that is improving.   He has renal f/u pending.    He is already making plans for his garden in the spring of next year.  Meds, vitals, and allergies reviewed.   ROS: Per HPI unless specifically indicated in ROS section   GEN: nad, alert and oriented HEENT: mucous membranes moist NECK: supple w/o LA CV: rrr PULM: ctab, no inc wob ABD: soft, +bs EXT: no edema SKIN: well perfused.

## 2024-08-11 NOTE — Assessment & Plan Note (Signed)
 Fortunately he had benign pathology, he no longer requires a catheter, and his urinary symptoms are improving.  Discussed Kegel exercises.  He can update me as needed.

## 2024-08-11 NOTE — Assessment & Plan Note (Signed)
 Most recent creatinine 2 with GFR 31.  This is improved.  No change in medications at this point.  Discussed renal component of anemia with hemoglobin reasonable at 9.8.  He can update me as needed.

## 2024-09-15 ENCOUNTER — Other Ambulatory Visit: Payer: Self-pay | Admitting: Physician Assistant

## 2024-09-24 ENCOUNTER — Other Ambulatory Visit: Payer: Self-pay | Admitting: Physician Assistant

## 2024-09-27 ENCOUNTER — Other Ambulatory Visit: Payer: Self-pay | Admitting: Family Medicine

## 2024-09-27 ENCOUNTER — Telehealth: Payer: Self-pay | Admitting: Student in an Organized Health Care Education/Training Program

## 2024-09-27 DIAGNOSIS — E039 Hypothyroidism, unspecified: Secondary | ICD-10-CM

## 2024-09-27 DIAGNOSIS — N400 Enlarged prostate without lower urinary tract symptoms: Secondary | ICD-10-CM

## 2024-09-27 NOTE — Telephone Encounter (Signed)
" °*  STAT* If patient is at the pharmacy, call can be transferred to refill team.   1. Which medications need to be refilled? (please list name of each medication and dose if known)   carvedilol  (COREG ) 12.5 MG tablet   spironolactone  (ALDACTONE ) 25 MG tablet [[id number]]    2. Would you like to learn more about the convenience, safety, & potential cost savings by using the [facility]? No    3. Are you open to using the [facility] (Type [facility]. No    4. Which pharmacy/location (including street and city if local pharmacy) is medication to be sent to?[facility] - [place], [place] - [address]    5. Do they need a 30 day or 90 day supply? 90 day  "

## 2024-09-27 NOTE — Telephone Encounter (Unsigned)
 Copied from CRM [id number]. Topic: Clinical - Medication Refill >> Sep 27, 2024 12:30 PM Aisha D wrote: Medication: levothyroxine  (SYNTHROID ) 75 MCG tablet  Has the patient contacted their pharmacy? No (Agent: If no, request that the patient contact the pharmacy for the refill. If patient does not wish to contact the pharmacy document the reason why and proceed with request.) (Agent: If yes, when and what did the pharmacy advise?)  This is the patient's preferred pharmacy:    [phone] [place] - [address] [place] [address] Phone: [phone] Fax: [phone]  Is this the correct pharmacy for this prescription? Yes If no, delete pharmacy and type the correct one.   Has the prescription been filled recently? No  Is the patient out of the medication? No  Has the patient been seen for an appointment in the last year OR does the patient have an upcoming appointment? Yes  Can we respond through MyChart? No  Agent: Please be advised that Rx refills may take up to 3 business days. We ask that you follow-up with your pharmacy.

## 2024-09-27 NOTE — Telephone Encounter (Signed)
 Copied from CRM [id number]. Topic: Clinical - Medication Refill >> Sep 27, 2024 12:39 PM Aisha D wrote: Medication: tamsulosin  (FLOMAX ) 0.4 MG CAPS capsule, ferrous sulfate  325 (65 FE) MG tablet  Has the patient contacted their pharmacy? No (Agent: If no, request that the patient contact the pharmacy for the refill. If patient does not wish to contact the pharmacy document the reason why and proceed with request.) (Agent: If yes, when and what did the pharmacy advise?)  This is the patient's preferred pharmacy:  [facility] - [place], [place] - [address] [place] [address] Phone: [phone] Fax: [phone]   Is this the correct pharmacy for this prescription? Yes If no, delete pharmacy and type the correct one.   Has the prescription been filled recently? No  Is the patient out of the medication? No  Has the patient been seen for an appointment in the last year OR does the patient have an upcoming appointment? Yes  Can we respond through MyChart? No  Agent: Please be advised that Rx refills may take up to 3 business days. We ask that you follow-up with your pharmacy.

## 2024-09-28 MED ORDER — LEVOTHYROXINE SODIUM 75 MCG PO TABS: 75.0000 ug | ORAL_TABLET | Freq: Every day | ORAL | 3 refills | Status: AC

## 2024-09-29 MED ORDER — CARVEDILOL 12.5 MG PO TABS: 12.5000 mg | ORAL_TABLET | Freq: Two times a day (BID) | ORAL | 2 refills | Status: AC

## 2024-09-29 MED ORDER — SACUBITRIL-VALSARTAN 97-103 MG PO TABS: 1.0000 | ORAL_TABLET | Freq: Two times a day (BID) | ORAL | 2 refills | Status: AC

## 2024-09-29 NOTE — Telephone Encounter (Signed)
 Pt's medications were sent to pt's pharmacy as requested. Confirmation received.

## 2024-10-01 MED ORDER — TAMSULOSIN HCL 0.4 MG PO CAPS: 0.4000 mg | ORAL_CAPSULE | Freq: Two times a day (BID) | ORAL | 3 refills | Status: AC

## 2024-10-01 NOTE — Telephone Encounter (Signed)
 Ferrous Sulfate  325 (65 FE) MG tablet LOV 08/10/24 NOV 01-04-24 Last fill 07/09/22

## 2024-10-03 MED ORDER — FERROUS SULFATE 325 (65 FE) MG PO TABS: 325.0000 mg | ORAL_TABLET | Freq: Every day | ORAL | 1 refills | Status: AC

## 2025-01-03 ENCOUNTER — Ambulatory Visit
